# Patient Record
Sex: Male | Born: 1935 | Race: White | Hispanic: No | Marital: Single | Smoking: Former smoker
Health system: Southern US, Community
[De-identification: ages and names within clinical notes are randomized; demographics above are authoritative.]

## PROBLEM LIST (undated history)

## (undated) DIAGNOSIS — J189 Pneumonia, unspecified organism: Secondary | ICD-10-CM

## (undated) DIAGNOSIS — I359 Nonrheumatic aortic valve disorder, unspecified: Secondary | ICD-10-CM

## (undated) DIAGNOSIS — J8410 Pulmonary fibrosis, unspecified: Secondary | ICD-10-CM

## (undated) DIAGNOSIS — I10 Essential (primary) hypertension: Secondary | ICD-10-CM

## (undated) DIAGNOSIS — I714 Abdominal aortic aneurysm, without rupture, unspecified: Secondary | ICD-10-CM

## (undated) DIAGNOSIS — N189 Chronic kidney disease, unspecified: Secondary | ICD-10-CM

## (undated) DIAGNOSIS — J9611 Chronic respiratory failure with hypoxia: Secondary | ICD-10-CM

## (undated) DIAGNOSIS — F411 Generalized anxiety disorder: Secondary | ICD-10-CM

## (undated) DIAGNOSIS — I4891 Unspecified atrial fibrillation: Principal | ICD-10-CM

## (undated) DIAGNOSIS — I739 Peripheral vascular disease, unspecified: Secondary | ICD-10-CM

## (undated) DIAGNOSIS — E785 Hyperlipidemia, unspecified: Secondary | ICD-10-CM

## (undated) DIAGNOSIS — E538 Deficiency of other specified B group vitamins: Secondary | ICD-10-CM

## (undated) DIAGNOSIS — G609 Hereditary and idiopathic neuropathy, unspecified: Secondary | ICD-10-CM

## (undated) DIAGNOSIS — D649 Anemia, unspecified: Secondary | ICD-10-CM

## (undated) DIAGNOSIS — R3129 Other microscopic hematuria: Secondary | ICD-10-CM

## (undated) DIAGNOSIS — I2510 Atherosclerotic heart disease of native coronary artery without angina pectoris: Secondary | ICD-10-CM

## (undated) DIAGNOSIS — E119 Type 2 diabetes mellitus without complications: Secondary | ICD-10-CM

## (undated) DIAGNOSIS — I6529 Occlusion and stenosis of unspecified carotid artery: Secondary | ICD-10-CM

## (undated) DIAGNOSIS — E039 Hypothyroidism, unspecified: Secondary | ICD-10-CM

## (undated) DIAGNOSIS — J449 Chronic obstructive pulmonary disease, unspecified: Secondary | ICD-10-CM

## (undated) DIAGNOSIS — D539 Nutritional anemia, unspecified: Secondary | ICD-10-CM

## (undated) DIAGNOSIS — G629 Polyneuropathy, unspecified: Secondary | ICD-10-CM

## (undated) HISTORY — DX: Nonrheumatic aortic valve disorder, unspecified: I35.9

## (undated) HISTORY — DX: Pulmonary fibrosis, unspecified: J84.10

## (undated) HISTORY — DX: Chronic kidney disease, unspecified: N18.9

## (undated) HISTORY — DX: Abdominal aortic aneurysm, without rupture: I71.4

## (undated) HISTORY — DX: Anemia, unspecified: D64.9

## (undated) HISTORY — DX: Type 2 diabetes mellitus without complications: E11.9

## (undated) HISTORY — DX: Peripheral vascular disease, unspecified: I73.9

## (undated) HISTORY — DX: Hereditary and idiopathic neuropathy, unspecified: G60.9

## (undated) HISTORY — DX: Pneumonia, unspecified organism: J18.9

## (undated) HISTORY — DX: Chronic respiratory failure with hypoxia: J96.11

## (undated) HISTORY — DX: Abdominal aortic aneurysm, without rupture, unspecified: I71.40

## (undated) HISTORY — DX: Essential (primary) hypertension: I10

## (undated) HISTORY — DX: Generalized anxiety disorder: F41.1

## (undated) HISTORY — DX: Polyneuropathy, unspecified: G62.9

## (undated) HISTORY — PX: OTHER SURGICAL HISTORY: SHX169

## (undated) HISTORY — DX: Deficiency of other specified B group vitamins: E53.8

## (undated) HISTORY — DX: Other microscopic hematuria: R31.29

## (undated) HISTORY — DX: Hyperlipidemia, unspecified: E78.5

## (undated) HISTORY — DX: Chronic obstructive pulmonary disease, unspecified: J44.9

## (undated) HISTORY — DX: Nutritional anemia, unspecified: D53.9

## (undated) HISTORY — DX: Occlusion and stenosis of unspecified carotid artery: I65.29

## (undated) HISTORY — DX: Atherosclerotic heart disease of native coronary artery without angina pectoris: I25.10

---

## 1987-09-01 HISTORY — PX: HERNIA REPAIR: SHX51

## 1992-08-31 HISTORY — PX: INGUINAL HERNIA REPAIR: SUR1180

## 2008-12-10 ENCOUNTER — Ambulatory Visit: Payer: Self-pay | Admitting: Vascular Surgery

## 2008-12-13 ENCOUNTER — Encounter: Admission: RE | Admit: 2008-12-13 | Discharge: 2008-12-13 | Payer: Self-pay | Admitting: Cardiology

## 2008-12-14 ENCOUNTER — Inpatient Hospital Stay (HOSPITAL_COMMUNITY): Admission: RE | Admit: 2008-12-14 | Discharge: 2008-12-15 | Payer: Self-pay | Admitting: Cardiology

## 2009-11-26 ENCOUNTER — Encounter: Admission: RE | Admit: 2009-11-26 | Discharge: 2009-11-26 | Payer: Self-pay | Admitting: Cardiology

## 2009-11-26 ENCOUNTER — Encounter: Payer: Self-pay | Admitting: Pulmonary Disease

## 2009-12-02 ENCOUNTER — Encounter: Payer: Self-pay | Admitting: Pulmonary Disease

## 2009-12-04 DIAGNOSIS — J439 Emphysema, unspecified: Secondary | ICD-10-CM

## 2009-12-05 ENCOUNTER — Ambulatory Visit: Payer: Self-pay | Admitting: Pulmonary Disease

## 2009-12-05 DIAGNOSIS — J9611 Chronic respiratory failure with hypoxia: Secondary | ICD-10-CM | POA: Insufficient documentation

## 2009-12-05 DIAGNOSIS — J8410 Pulmonary fibrosis, unspecified: Secondary | ICD-10-CM

## 2009-12-05 DIAGNOSIS — F17200 Nicotine dependence, unspecified, uncomplicated: Secondary | ICD-10-CM

## 2009-12-06 ENCOUNTER — Encounter: Payer: Self-pay | Admitting: Pulmonary Disease

## 2009-12-08 ENCOUNTER — Encounter: Payer: Self-pay | Admitting: Pulmonary Disease

## 2009-12-15 ENCOUNTER — Encounter: Payer: Self-pay | Admitting: Pulmonary Disease

## 2009-12-17 LAB — CONVERTED CEMR LAB
ANA Titer 1: 1:640 {titer} — ABNORMAL HIGH
Anti Nuclear Antibody(ANA): POSITIVE — AB
Bilirubin, Direct: 0.2 mg/dL (ref 0.0–0.3)
Rhuematoid fact SerPl-aCnc: <20 intl units/mL (ref 0.0–20.0)
Sed Rate: 56 mm/hr — ABNORMAL HIGH (ref 0–22)
Total Bilirubin: 0.6 mg/dL (ref 0.3–1.2)

## 2009-12-24 ENCOUNTER — Encounter: Payer: Self-pay | Admitting: Pulmonary Disease

## 2010-01-02 ENCOUNTER — Ambulatory Visit: Payer: Self-pay | Admitting: Pulmonary Disease

## 2010-01-06 ENCOUNTER — Telehealth: Payer: Self-pay | Admitting: Pulmonary Disease

## 2010-01-10 ENCOUNTER — Telehealth (INDEPENDENT_AMBULATORY_CARE_PROVIDER_SITE_OTHER): Payer: Self-pay | Admitting: *Deleted

## 2010-01-12 ENCOUNTER — Encounter: Payer: Self-pay | Admitting: Pulmonary Disease

## 2010-01-13 ENCOUNTER — Encounter: Payer: Self-pay | Admitting: Pulmonary Disease

## 2010-01-14 ENCOUNTER — Encounter: Payer: Self-pay | Admitting: Pulmonary Disease

## 2010-01-15 ENCOUNTER — Telehealth (INDEPENDENT_AMBULATORY_CARE_PROVIDER_SITE_OTHER): Payer: Self-pay | Admitting: *Deleted

## 2010-01-15 ENCOUNTER — Telehealth: Payer: Self-pay | Admitting: Pulmonary Disease

## 2010-01-16 ENCOUNTER — Encounter: Payer: Self-pay | Admitting: Pulmonary Disease

## 2010-01-18 ENCOUNTER — Encounter: Payer: Self-pay | Admitting: Pulmonary Disease

## 2010-01-31 ENCOUNTER — Ambulatory Visit: Payer: Self-pay | Admitting: Pulmonary Disease

## 2010-02-03 ENCOUNTER — Telehealth (INDEPENDENT_AMBULATORY_CARE_PROVIDER_SITE_OTHER): Payer: Self-pay | Admitting: *Deleted

## 2010-02-04 ENCOUNTER — Encounter: Payer: Self-pay | Admitting: Pulmonary Disease

## 2010-02-17 ENCOUNTER — Encounter: Payer: Self-pay | Admitting: Pulmonary Disease

## 2010-02-20 ENCOUNTER — Encounter: Payer: Self-pay | Admitting: Pulmonary Disease

## 2010-02-26 ENCOUNTER — Ambulatory Visit: Payer: Self-pay | Admitting: Pulmonary Disease

## 2010-03-12 ENCOUNTER — Encounter: Payer: Self-pay | Admitting: Pulmonary Disease

## 2010-03-20 ENCOUNTER — Encounter: Payer: Self-pay | Admitting: Pulmonary Disease

## 2010-04-07 ENCOUNTER — Telehealth (INDEPENDENT_AMBULATORY_CARE_PROVIDER_SITE_OTHER): Payer: Self-pay | Admitting: *Deleted

## 2010-04-30 ENCOUNTER — Ambulatory Visit: Payer: Self-pay | Admitting: Pulmonary Disease

## 2010-05-23 ENCOUNTER — Encounter: Payer: Self-pay | Admitting: Pulmonary Disease

## 2010-08-01 ENCOUNTER — Ambulatory Visit: Payer: Self-pay | Admitting: Pulmonary Disease

## 2010-08-01 DIAGNOSIS — J310 Chronic rhinitis: Secondary | ICD-10-CM

## 2010-09-30 NOTE — Assessment & Plan Note (Signed)
Summary: ROV/ MBW   Copy to:  Dr. [name]  CC:  1 month follow up.  Per pt's daughter, pt was in ICU at [facility] approx 2 wks ago and was diagnosed with CHF.  Pt states he is still having SOB with exertion but states this is better since last ov.  Denies wheezing, chest tightness, and cough.  Pt states he needs diagnosis code for nebulizar rx that was given while he was in the hospital..  History of Present Illness: 77 yo male with COPD, Pulmonary fibrosis, and hypoxemia.  He was hospitalized recently at [facility] for CHF exacerbation.  He was told he had a mild heart attack also.  He was in the ICU there, and had to use BPAP temporarily.  He was given lasix, and his breathing improved.  He was prescribed a nebulizer, but has not gotten this filled yet.  He is not having much cough, wheeze, or congestion.  He still occasionally brings up some sputum with some blood streaking.  He has not had fever, or skin rash.  He is using prednisone 10 mg once daily.  He is using his symbicort once per day, and stopped spiriva about one week ago.  He is concerned about the expense of his medications.  He has not been using his oxygen on a consistent basis.    He needs to schedule a follow up visit with Dr. Donnie Aho.  He is planning on scheduling an appointment with Dr. Murray Hodgkins to establish a primary care physician in [place].   Current Medications (verified): 1)  Spiriva Handihaler 18 Mcg Caps (Tiotropium Bromide Monohydrate) .Marland Kitchen.. 1 Capsule Daily in Handihaler 2)  Proventil Hfa 108 (90 Base) Mcg/act Aers (Albuterol Sulfate) .... As Needed 3)  Aspir-Low 81 Mg Tbec (Aspirin) .... Once Daily 4)  Plavix 75 Mg Tabs (Clopidogrel Bisulfate) .Marland Kitchen.. 1 By Mouth Daily 5)  Pravastatin Sodium 20 Mg Tabs (Pravastatin Sodium) .Marland Kitchen.. 1 By Mouth At Bedtime 6)  Niaspan 500 Mg Cr-Tabs (Niacin (Antihyperlipidemic)) .Marland Kitchen.. 1 By Mouth At Bedtime 7)  Fish Oil 1000 Mg Caps (Omega-3 Fatty Acids) .Marland Kitchen.. 1 By Mouth  Daily 8)  Gabapentin 300 Mg Caps (Gabapentin) .... 2 By Mouth Daily 9)  Nitrostat 0.4 Mg Subl (Nitroglycerin) .... As Needed Subl 10)  Sertraline Hcl 50 Mg Tabs (Sertraline Hcl) .Marland Kitchen.. 1 By Mouth Daily 11)  Symbicort 160-4.5 Mcg/act Aero (Budesonide-Formoterol Fumarate) .... Two Puffs Once Daily 12)  Mucinex 600 Mg Xr12h-Tab (Guaifenesin) .Marland Kitchen.. 1 By Mouth Two Times A Day 13)  Prednisone 10 Mg Tabs (Prednisone) .... 2 Pills Once Daily 14)  Coreg 3.125 Mg Tabs (Carvedilol) .... Take 1 Tablet By Mouth Two Times A Day 15)  Xanax 0.25 Mg Tabs (Alprazolam) .... Take 1 Tab By Mouth At Bedtime As Needed 16)  Xopenex 1.25 Mg/16ml Nebu (Levalbuterol Hcl) .... Every 6 Hours As Needed 17)  Januvia 50 Mg Tabs (Sitagliptin Phosphate) .... Once Daily 18)  Imdur 30 Mg Xr24h-Tab (Isosorbide Mononitrate) .... Two Times A Day 19)  Oxycodone Hcl 5 Mg Tabs (Oxycodone Hcl) .... Every 6 Hours As Needed Pain 20)  Mag-Ox 400 400 Mg Tabs (Magnesium Oxide) .... Once Daily 21)  Flexeril 5 Mg Tabs (Cyclobenzaprine Hcl) .... As Needed  Allergies (verified): No Known Drug Allergies  Past History:  Past Medical History: Reviewed history from 01/02/2010 and no changes required. GOLD 4 COPD      - Spirometry 12/05/09 FEV1 1.20(47%), FVC 2.56(64%), FEV1% 46 Hypoxemia      -  SpO2 86% room air with exertion 12/05/09      - 3 liters oxygen with exertion and sleep Pulmonary fibrosis      - ANA 12/05/09 titer 1:640 CAD s/p stenting Hypertension Hyperlipidemia Carotid stenosis Peripheral vascular disease Anemia Neuropathy B12 deficiency Pneumonia  Past Surgical History: Reviewed history from 12/05/2009 and no changes required. Inguinal hernia repair in 1994  Vital Signs:  Patient profile:   77 year old male Height:      70 inches Weight:      148 pounds BMI:     21.31 O2 Sat:      93 % on Room air Temp:     97.9 degrees F oral Pulse rate:   72 / minute BP sitting:   112 / 56  (right arm) Cuff size:    regular  Vitals Entered By: Gweneth Dimitri RN (January 31, 2010 12:43 PM)  O2 Flow:  Room air CC: 1 month follow up.  Per pt's daughter, pt was in ICU at [facility] approx 2 wks ago and was diagnosed with CHF.  Pt states he is still having SOB with exertion but states this is better since last ov.  Denies wheezing, chest tightness, cough.  Pt states he needs diagnosis code for nebulizar rx that was given while he was in the hospital. Comments Medications reviewed with patient Daytime contact number verified with patient. Gweneth Dimitri RN  January 31, 2010 12:43 PM    Physical Exam  General:  thin.   Nose:  pale mucosa, no discharge Mouth:  wears dentures, no exudate Neck:  no JVD.   Lungs:  diminished breath sounds, prolonged exhalation, no wheezing, no rales Heart:  regular rhythm and normal rate with 2/6 SM Extremities:  no clubbing, cyanosis, edema, or deformity noted Cervical Nodes:  no significant adenopathy   Impression & Recommendations:  Problem # 1:  PULMONARY FIBROSIS (ICD-515) He has elevated ANA.  Will continue to gradually taper his prednisone.  Will get copy of his chest xray from [facility].  Will plan on repeating his chest xray at his next follow up.  Problem # 2:  C O P D (ICD-496) He is concerned about how much his inhaler regimen has been costing him.  As a result he has not been using his inhalers on a regular basis.  Will stop his spiriva and symbicort.  Will have him use albuterol and ipratropium in nebulizer qid as needed, and proventil as needed.  Depending on his symptoms will decide if he needs to restart his other inhalers.  Problem # 3:  TOBACCO ABUSE (ICD-305.1) Encouraged him to remain tobacco free.  Problem # 4:  HYPOXEMIA (ICD-799.02) Explained to him that he definitely needs to use his oxygen with exertion and sleep for now.  Will re-assess at next visit.  Problem # 5:  CAD (ICD-414.00) He was recently hospitalized in [facility] for heart  failure and possible heart attack.  Will get copy of his medical records.  Advised him to follow up with Dr. Donnie Aho, and d/w Dr. Donnie Aho about whether he can have an adjustment in his cardiac medication regimen.  He is concerned about the expense of his medications.  Medications Added to Medication List This Visit: 1)  Prednisone 10 Mg Tabs (Prednisone) .... 2 pills once daily 2)  Ipratropium Bromide 0.06 % Soln (Ipratropium bromide) .... One vial nebulized up to four times per day as needed 3)  Albuterol Sulfate (2.5 Mg/10ml) 0.083% Nebu (Albuterol  sulfate) .... One vial nebulized up to four times per day as needed 4)  Xopenex 1.25 Mg/40ml Nebu (Levalbuterol hcl) .... Every 6 hours as needed 5)  Prednisone 10 Mg Tabs (Prednisone) .... One pill alternating with 1/2 pill every other day for two weeks, then 1/2 pill once daily 6)  Aspir-low 81 Mg Tbec (Aspirin) .... Once daily 7)  Coreg 3.125 Mg Tabs (Carvedilol) .... Take 1 tablet by mouth two times a day 8)  Imdur 30 Mg Xr24h-tab (Isosorbide mononitrate) .... Two times a day 9)  Xanax 0.25 Mg Tabs (Alprazolam) .... Take 1 tab by mouth at bedtime as needed 10)  Januvia 50 Mg Tabs (Sitagliptin phosphate) .... Once daily 11)  Oxycodone Hcl 5 Mg Tabs (Oxycodone hcl) .... Every 6 hours as needed pain 12)  Mag-ox 400 400 Mg Tabs (Magnesium oxide) .... Once daily 13)  Flexeril 5 Mg Tabs (Cyclobenzaprine hcl) .... As needed  Complete Medication List: 1)  Ipratropium Bromide 0.06 % Soln (Ipratropium bromide) .... One vial nebulized up to four times per day as needed 2)  Albuterol Sulfate (2.5 Mg/70ml) 0.083% Nebu (Albuterol sulfate) .... One vial nebulized up to four times per day as needed 3)  Proventil Hfa 108 (90 Base) Mcg/act Aers (Albuterol sulfate) .... As needed 4)  Prednisone 10 Mg Tabs (Prednisone) .... One pill alternating with 1/2 pill every other day for two weeks, then 1/2 pill once daily 5)  Aspir-low 81 Mg Tbec (Aspirin) .... Once daily 6)   Plavix 75 Mg Tabs (Clopidogrel bisulfate) .Marland Kitchen.. 1 by mouth daily 7)  Coreg 3.125 Mg Tabs (Carvedilol) .... Take 1 tablet by mouth two times a day 8)  Imdur 30 Mg Xr24h-tab (Isosorbide mononitrate) .... Two times a day 9)  Pravastatin Sodium 20 Mg Tabs (Pravastatin sodium) .Marland Kitchen.. 1 by mouth at bedtime 10)  Niaspan 500 Mg Cr-tabs (Niacin (antihyperlipidemic)) .Marland Kitchen.. 1 by mouth at bedtime 11)  Fish Oil 1000 Mg Caps (Omega-3 fatty acids) .Marland Kitchen.. 1 by mouth daily 12)  Gabapentin 300 Mg Caps (Gabapentin) .... 2 by mouth daily 13)  Nitrostat 0.4 Mg Subl (Nitroglycerin) .... As needed subl 14)  Sertraline Hcl 50 Mg Tabs (Sertraline hcl) .Marland Kitchen.. 1 by mouth daily 15)  Mucinex 600 Mg Xr12h-tab (Guaifenesin) .Marland Kitchen.. 1 by mouth two times a day 16)  Xanax 0.25 Mg Tabs (Alprazolam) .... Take 1 tab by mouth at bedtime as needed 17)  Januvia 50 Mg Tabs (Sitagliptin phosphate) .... Once daily 18)  Oxycodone Hcl 5 Mg Tabs (Oxycodone hcl) .... Every 6 hours as needed pain 19)  Mag-ox 400 400 Mg Tabs (Magnesium oxide) .... Once daily 20)  Flexeril 5 Mg Tabs (Cyclobenzaprine hcl) .... As needed  Other Orders: DME Referral (DME) Est. Patient Level III (91478) Prescription Created Electronically [phone])  Patient Instructions: 1)  Stop spiriva 2)  Stop symbicort 3)  Albuterol with Ipratropium nebulized up to four times per day as needed  4)  Proventil two puffs up to four times per day as needed 5)  Prednisone 10 mg pills: 1 pill alternating with 1/2 pill every other day for two weeks, then 1/2 pill once daily until next visit 6)  Will get copy of medical records form [facility] 7)  Follow up in 4 weeks Prescriptions: IPRATROPIUM BROMIDE 0.06 % SOLN (IPRATROPIUM BROMIDE) one vial nebulized up to four times per day as needed  #120 x 6   Entered and Authorized by:   Coralyn Helling MD   Signed by:  Coralyn Helling MD on 01/31/2010   Method used:   Electronically to        [facility] [phone]* (retail)       [address]       [place], [place]  [address]       Ph: [phone] or [phone]       Fax: [phone]   RxID:   [id number] ALBUTEROL SULFATE (2.5 MG/3ML) 0.083% NEBU (ALBUTEROL SULFATE) one vial nebulized up to four times per day as needed  #120 x 6   Entered and Authorized by:   Coralyn Helling MD   Signed by:   Coralyn Helling MD on 01/31/2010   Method used:   Electronically to        [facility] [phone]* (retail)       [address]       [place], [place]  [address]       Ph: [phone] or [phone]       Fax: [phone]   RxID:   [id number]

## 2010-09-30 NOTE — Assessment & Plan Note (Signed)
Summary: pulm fibrosis & severe dyspnea/apc   Copy to:  Dr. [name]  CC:  Pulmonary consult.  The patient c/o increased sob that is worse x1 month..  History of Present Illness: 77 yo male for evaluation of COPD, dyspnea, and abnormal xray.  He has been having trouble with his breathing for some time.  He has an extensive history of smoking, and has been told he has COPD.  He has been on inhaler therapy for about 1.5 years.  His breathing has been getting worse recently.  He was seen by his cardiologist to determine if his heart dysfunction was contributing to his symptoms.  During this evaluation he had a chest xray which show emphysema and basilar fibrosis.  The fibrosis was a new finding.  He then had CT chest which confirmed basilar fibrosis.  As a result pulmonary evaluation was requested.  He had pneumonia about one year ago, and had a mild heart attack associated with this.  He was intubated in January 2010 while in [place] due to heart and breathing problems.   He has been getting wheeze and chest congestion.  He brings up green to yellow sputum.  He denies fever or hemoptysis.  He has been getting sinus congestion with post-nasal drip.  He has not had any skin rash, joint swelling, Raynaud's like symptoms.  He has lost about 15 lbs recently.  He denies chest pain, but occasionally feels his heart race.  He denies abdominal pain, or diarrhea.    There is no history of asthma or TB.  He started smoking at age 29, and smoked up to 1 pack per day.  He quit smoking in March 2011.  He worked in a [employer].  There is no recent travel history, or sick exposures.  He denies animal exposures.  His recent lab tests showed he has anemia and B12 deficiency.  CXR  Procedure date:  12/13/2008  Findings:       CHEST - 2 VIEW    Comparison: None    Findings: The lungs are hyperaerated consistent with COPD.  No   active infiltrate or effusion is seen.  Mild peribronchial  thickening is noted. The heart is within normal limits in size.   There are degenerative changes throughout the thoracic spine.    IMPRESSION:   COPD.  Peribronchial thickening may indicate bronchitis.  No   infiltrate or effusion is seen.  CXR  Procedure date:  11/26/2009  Findings:      CHEST - 2 VIEW    Comparison: 12/13/2008    Findings: There is hyperinflation of the lungs compatible with   COPD.  Increasing interstitial prominence throughout the lungs,   particularly lung bases.  I suspect this may reflect scarring /   early fibrosis.  No definite acute opacity or effusion.  Heart is   borderline in size.  No acute bony abnormality.    IMPRESSION:   COPD/chronic changes.  Increasing interstitial prominence in the   lower lobes may reflect early fibrosis.  CT of Chest  Procedure date:  11/26/2009  Findings:      Contrast:  100 ml Omnipaque 300 IV.    Comparison:  Chest x-ray 11/26/2009    Findings:  No filling defects in the pulmonary arteries to suggest   pulmonary emboli.    Severe COPD changes throughout the lungs.  Reticular densities are   seen in the lower lobes bilaterally, predominately peripheral.   Question early fibrosis/UIP.  Ground-glass densities  are noted   posteriorly in both lung bases, possibly atelectasis.    Scattered borderline mediastinal lymph nodes.  No hilar or axillary   adenopathy.  Heart is borderline in size.  Aorta is normal caliber.   Coronary artery calcifications are present.    Imaging into the upper abdomen shows no acute findings.  Numerous   bilateral renal cysts partially imaged.    Review of the MIP images confirms the above findings.    IMPRESSION:   No evidence of pulmonary embolus.    Severe COPD.    Interstitial and reticular opacities in the lung bases.  Cannot   exclude early IPF/UIP.   Preventive Screening-Counseling & Management  Alcohol-Tobacco     Smoking Status: quit < 6 months     Year Quit:  10/2009     Pack years: 65 yrs x1/2 to 1 ppd  CardioPerfect Spirometry  ID: [id number] Patient: Travis Bond, Travis Bond DOB: 08-21-1927 Age: 77 Years Old Sex: Male Race: White Height: 70.5 Weight: 142.38 Status: Unconfirmed Past Medical History:  Current Problems:  CAD (ICD-414.00) PERIPHERAL NEUROPATHY, IDIOPATHIC (ICD-356.9) CAROTID ARTERY STENOSIS (ICD-433.10) ANEMIA (ICD-285.9) PERIPHERAL VASCULAR DISEASE (ICD-443.9) HYPERLIPIDEMIA (ICD-272.4) C O P D (ICD-496)  Recorded: 12/05/2009 10:47 AM  Parameter  Measured Predicted %Predicted FVC     2.56        3.98        64.40 FEV1     1.20        2.83        42.40 FEV1%   46.74        71.54        65.30 PEF    2.77        7.16        38.60   Interpretation: Severe obstruction  Current Medications (verified): 1)  Spiriva Handihaler 18 Mcg Caps (Tiotropium Bromide Monohydrate) .Marland Kitchen.. 1 Capsule Daily in Handihaler 2)  Proventil Hfa 108 (90 Base) Mcg/act Aers (Albuterol Sulfate) .... As Needed 3)  Metoprolol Tartrate 25 Mg Tabs (Metoprolol Tartrate) .... 1/2 By Mouth Two Times A Day 4)  Amlodipine Besylate 5 Mg Tabs (Amlodipine Besylate) .Marland Kitchen.. 1 By Mouth Daily 5)  Isosorbide Mononitrate Cr 60 Mg Xr24h-Tab (Isosorbide Mononitrate) .Marland Kitchen.. 1 By Mouth Daily 6)  Aspirin 325 Mg Tabs (Aspirin) .Marland Kitchen.. 1 By Mouth Daily 7)  Plavix 75 Mg Tabs (Clopidogrel Bisulfate) .Marland Kitchen.. 1 By Mouth Daily 8)  Pravastatin Sodium 20 Mg Tabs (Pravastatin Sodium) .Marland Kitchen.. 1 By Mouth At Bedtime 9)  Niaspan 500 Mg Cr-Tabs (Niacin (Antihyperlipidemic)) .Marland Kitchen.. 1 By Mouth At Bedtime 10)  Fish Oil 1000 Mg Caps (Omega-3 Fatty Acids) .Marland Kitchen.. 1 By Mouth Daily 11)  Gabapentin 300 Mg Caps (Gabapentin) .... 2 By Mouth Daily 12)  Nitrostat 0.4 Mg Subl (Nitroglycerin) .... As Needed Subl 13)  Sertraline Hcl 50 Mg Tabs (Sertraline Hcl) .Marland Kitchen.. 1 By Mouth Daily 14)  Guaifenesin 400 Mg Tabs (Guaifenesin) .... Two Times A Day  Allergies (verified): No Known Drug Allergies  Past History:  Past  Medical History: GOLD 4 COPD      - Spirometry 12/05/09 FEV1 1.20(47%), FVC 2.56(64%), FEV1% 46 Hypoxemia      - SpO2 86% room air with exertion 12/05/09 CAD s/p stenting Hypertension Hyperlipidemia Carotid stenosis Peripheral vascular disease Anemia Neuropathy B12 deficiency Pneumonia  Past Surgical History: Inguinal hernia repair in 1994  Family History: Family History Hypertension---brother and sister  Social History: Divorced.  Lives with daughter, Oren Binet. Retired. Quit smoking March 2011. Smoking Status:  quit < 6 months Pack years:  65 yrs x1/2 to 1 ppd  Review of Systems       The patient complains of shortness of breath with activity, productive cough, chest pain, loss of appetite, weight change, nasal congestion/difficulty breathing through nose, itching, anxiety, depression, and change in color of mucus.  The patient denies shortness of breath at rest, non-productive cough, coughing up blood, irregular heartbeats, acid heartburn, indigestion, abdominal pain, difficulty swallowing, sore throat, tooth/dental problems, headaches, sneezing, ear ache, hand/feet swelling, joint stiffness or pain, rash, and fever.    Vital Signs:  Patient profile:   77 year old male Height:      70.5 inches (179.07 cm) Weight:      142.38 pounds (64.72 kg) BMI:     20.21 O2 Sat:      84 % on Room air Temp:     97.6 degrees F (36.44 degrees C) oral Pulse rate:   66 / minute BP sitting:   112 / 70  (left arm) Cuff size:   regular  Vitals Entered By: Michel Bickers CMA (December 05, 2009 10:11 AM)  O2 Sat at Rest %:  84 O2 Flow:  Room air  O2 Sat Comments The patients sats were at 84% on room air after walking to the exam room. He was placed on oxygen at 2 liters and his sats recovered to 91% and pulse was 66. Michel Bickers CMA  December 05, 2009 10:13 AM  Serial Vital Signs/Assessments:  Comments: 11:08 AM Ambulatory Pulse Oximetry  Resting; HR__67___    02 Sat__90___  Lap1  (185 feet)   HR__94___   02 Sat__95___ Lap2 (185 feet)   HR_84____   02 Sat_86____    Lap3 (185 feet)   HR_____   02 Sat_____  ___Test Completed without Difficulty __x_Test Stopped due to:  Patients sats dropped to 86% on room air after walking 2 laps. He was placed on oxygen on 2 liters and sats recovered to 96% and his pulse was 60.  By: Michel Bickers CMA   CC: Pulmonary consult.  The patient c/o increased sob that is worse x1 month.   Physical Exam  General:  thin.   Eyes:  PERRLA and EOMI.   Nose:  pale mucosa, no discharge Mouth:  wears dentures, no exudate Neck:  no JVD.   Chest Wall:  barrel chest.   Lungs:  diminished breath sounds, prolonged exhalation, no wheezing, faint basilar rales Heart:  regular rhythm and normal rate with 3/6 SM Abdomen:  thin, soft, nontender, no organomegaly Msk:  no deformity or scoliosis noted with normal posture Pulses:  pulses normal Extremities:  no clubbing, cyanosis, edema, or deformity noted Neurologic:  CN II-XII grossly intact with normal reflexes, coordination, muscle strength and tone Cervical Nodes:  no significant adenopathy Psych:  depressed affect.     Pulmonary Function Test Date: 12/05/2009 10:47 AM Gender: Male  Pre-Spirometry FVC    Value: 2.56 L/min   % Pred: 64.40 % FEV1    Value: 1.20 L     Pred: 2.83 L     % Pred: 42.40 % FEV1/FVC  Value: 46.74 %     % Pred: 65.30 %  Comments: Severe Obstruction.  Impression & Recommendations:  Problem # 1:  C O P D (ICD-496) His predominant respiratory disorder is related to his COPD with emphysema.  I have encouraged him to continue with his smoking abstinence.  He is to continue spiriva.  I will augment  his inhaler regimen by adding symbicort.  He is to continue on as needed proventil.  If he is to relocate to [facility] permanently, then consideration will be given to referring him to pulmonary rehab.  He also has a mild exacerbation with increased sputum and sputum  purulence.  Will give him a course of doxycycline, and prednisone.  Problem # 2:  PULMONARY FIBROSIS (ICD-515) He has mild fibrotic changes on CT chest.  Certainly he could have early IPF/UIP.  He could also have NSIP related to smoking.  I do not think he would be able to tolerate any type of lung tissue sampling.  I will check his labs to exclude any type of connective tissue disorder that may be causing this, but did not appreciate clinical evidence for these.  Will also give him a short trial of prednisone to see if he has symptomatic improvement as he may have some active inflammation.  Problem # 3:  HYPOXEMIA (ICD-799.02)  He does have oxygen desaturation with exertion.  Will start him on 2 liters oxygen with exertion and sleep.  Problem # 4:  TOBACCO ABUSE (ICD-305.1)  He has extensive prior history of smoking.  He has recently stopped smoking.  I have encouraged him to continue with his smoking abstinence.  Problem # 5:  ANEMIA (ICD-285.9) Certainly his anemia could be contributing to some degree to his current symptoms of dypsnea.  Hopefully as this is correct his breathing will improve to some degree.  Medications Added to Medication List This Visit: 1)  Symbicort 160-4.5 Mcg/act Aero (Budesonide-formoterol fumarate) .... Two puffs two times a day 2)  Prednisone 10 Mg Tabs (Prednisone) .... 3 pills for 2 days, 2 pills for 2 days, 1 pill for 2 days, 1/2 pill for 2 days 3)  Doxycycline Hyclate 100 Mg Caps (Doxycycline hyclate) .... One two times a day  Complete Medication List: 1)  Spiriva Handihaler 18 Mcg Caps (Tiotropium bromide monohydrate) .Marland Kitchen.. 1 capsule daily in handihaler 2)  Proventil Hfa 108 (90 Base) Mcg/act Aers (Albuterol sulfate) .... As needed 3)  Metoprolol Tartrate 25 Mg Tabs (Metoprolol tartrate) .... 1/2 by mouth two times a day 4)  Amlodipine Besylate 5 Mg Tabs (Amlodipine besylate) .Marland Kitchen.. 1 by mouth daily 5)  Isosorbide Mononitrate Cr 60 Mg Xr24h-tab (Isosorbide  mononitrate) .Marland Kitchen.. 1 by mouth daily 6)  Aspirin 325 Mg Tabs (Aspirin) .Marland Kitchen.. 1 by mouth daily 7)  Plavix 75 Mg Tabs (Clopidogrel bisulfate) .Marland Kitchen.. 1 by mouth daily 8)  Pravastatin Sodium 20 Mg Tabs (Pravastatin sodium) .Marland Kitchen.. 1 by mouth at bedtime 9)  Niaspan 500 Mg Cr-tabs (Niacin (antihyperlipidemic)) .Marland Kitchen.. 1 by mouth at bedtime 10)  Fish Oil 1000 Mg Caps (Omega-3 fatty acids) .Marland Kitchen.. 1 by mouth daily 11)  Gabapentin 300 Mg Caps (Gabapentin) .... 2 by mouth daily 12)  Nitrostat 0.4 Mg Subl (Nitroglycerin) .... As needed subl 13)  Sertraline Hcl 50 Mg Tabs (Sertraline hcl) .Marland Kitchen.. 1 by mouth daily 14)  Guaifenesin 400 Mg Tabs (Guaifenesin) .... Two times a day 15)  Symbicort 160-4.5 Mcg/act Aero (Budesonide-formoterol fumarate) .... Two puffs two times a day 16)  Prednisone 10 Mg Tabs (Prednisone) .... 3 pills for 2 days, 2 pills for 2 days, 1 pill for 2 days, 1/2 pill for 2 days 17)  Doxycycline Hyclate 100 Mg Caps (Doxycycline hyclate) .... One two times a day  Other Orders: Consultation Level IV (25956) Spirometry w/Graph (94010) TLB-Hepatic/Liver Function Pnl (80076-HEPATIC) TLB-Rheumatoid Factor (RA) (38756-EP) TLB-Sedimentation Rate (ESR) (85652-ESR) T-Antinuclear  Antib (ANA) [id number]) DME Referral (DME)  Patient Instructions: 1)  Doxycycline (antibiotic) one pill two times a day for 7 days 2)  Prednisone 10 mg pills: 3 for 2 days, 2 for 2 days, 1 for 2 days, 1/2 for 2 days 3)  Symbicort two puffs two times a day, and rinse mouth after using 4)  Continue Spiriva one puff once daily  5)  Continue Proventil two puffs up to four times per day for cough, wheeze, congestion, or shortness of breath 6)  Will start 2 liters oxygen with exertion and sleep.  Will arrange for oxygen test overnight 7)  Follow up in 4 weeks  Prescriptions: DOXYCYCLINE HYCLATE 100 MG CAPS (DOXYCYCLINE HYCLATE) one two times a day  #14 x 0   Entered and Authorized by:   Coralyn Helling MD   Signed by:   Coralyn Helling MD on  12/05/2009   Method used:   Print then Give to Patient   RxID:   [id number] PREDNISONE 10 MG TABS (PREDNISONE) 3 pills for 2 days, 2 pills for 2 days, 1 pill for 2 days, 1/2 pill for 2 days  #13 x 0   Entered and Authorized by:   Coralyn Helling MD   Signed by:   Coralyn Helling MD on 12/05/2009   Method used:   Print then Give to Patient   RxID:   [id number] SYMBICORT 160-4.5 MCG/ACT AERO (BUDESONIDE-FORMOTEROL FUMARATE) two puffs two times a day  #1 x 6   Entered and Authorized by:   Coralyn Helling MD   Signed by:   Coralyn Helling MD on 12/05/2009   Method used:   Print then Give to Patient   RxID:   [id number]

## 2010-09-30 NOTE — Letter (Signed)
Summary: [facility]  [facility]   Imported By: Lester Eclectic 02/10/2010 09:33:56  _____________________________________________________________________  External Attachment:    Type:   Image     Comment:   External Document

## 2010-09-30 NOTE — Assessment & Plan Note (Signed)
Summary: rov 4 wks ///kp   Visit Type:  Follow-up Copy to:  Dr. [name]  CC:  COPD. Pulmonary fibrosis.  The patient says his breathing has improved since starting Symbicort. He is not wearing his oxygen.Travis Bond  History of Present Illness: 77 yo male with COPD, Pulmonary fibrosis, and hypoxemia.  His breathing has been doing better.  He is not having as much cough.  He still has sputum which is clear.  He denies fever, or much wheeze.  He has been getting occasional nose bleeds.  He has not needed to use his proventil.  He is not using his oxygen.  He is not sure if he needs it.  His breathing has improved with prednisone also.  He is not having rash, swelling, or joint pain.   Current Medications (verified): 1)  Spiriva Handihaler 18 Mcg Caps (Tiotropium Bromide Monohydrate) .Travis Bond.. 1 Capsule Daily in Handihaler 2)  Proventil Hfa 108 (90 Base) Mcg/act Aers (Albuterol Sulfate) .... As Needed 3)  Metoprolol Tartrate 25 Mg Tabs (Metoprolol Tartrate) .... 1/2 By Mouth Two Times A Day 4)  Amlodipine Besylate 5 Mg Tabs (Amlodipine Besylate) .Travis Bond.. 1 By Mouth Daily 5)  Isosorbide Mononitrate Cr 60 Mg Xr24h-Tab (Isosorbide Mononitrate) .Travis Bond.. 1 By Mouth Daily 6)  Aspirin 325 Mg Tabs (Aspirin) .Travis Bond.. 1 By Mouth Daily 7)  Plavix 75 Mg Tabs (Clopidogrel Bisulfate) .Travis Bond.. 1 By Mouth Daily 8)  Pravastatin Sodium 20 Mg Tabs (Pravastatin Sodium) .Travis Bond.. 1 By Mouth At Bedtime 9)  Niaspan 500 Mg Cr-Tabs (Niacin (Antihyperlipidemic)) .Travis Bond.. 1 By Mouth At Bedtime 10)  Fish Oil 1000 Mg Caps (Omega-3 Fatty Acids) .Travis Bond.. 1 By Mouth Daily 11)  Gabapentin 300 Mg Caps (Gabapentin) .... 2 By Mouth Daily 12)  Nitrostat 0.4 Mg Subl (Nitroglycerin) .... As Needed Subl 13)  Sertraline Hcl 50 Mg Tabs (Sertraline Hcl) .Travis Bond.. 1 By Mouth Daily 14)  Guaifenesin 400 Mg Tabs (Guaifenesin) .... Two Times A Day 15)  Symbicort 160-4.5 Mcg/act Aero (Budesonide-Formoterol Fumarate) .... Two Puffs Two Times A Day 16)  Mucinex 600 Mg Xr12h-Tab  (Guaifenesin) .Travis Bond.. 1 By Mouth Two Times A Day  Allergies (verified): No Known Drug Allergies  Past History:  Past Medical History: GOLD 4 COPD      - Spirometry 12/05/09 FEV1 1.20(47%), FVC 2.56(64%), FEV1% 46 Hypoxemia      - SpO2 86% room air with exertion 12/05/09      - 3 liters oxygen with exertion and sleep Pulmonary fibrosis      - ANA 12/05/09 titer 1:640 CAD s/p stenting Hypertension Hyperlipidemia Carotid stenosis Peripheral vascular disease Anemia Neuropathy B12 deficiency Pneumonia  Vital Signs:  Patient profile:   77 year old male Height:      70.5 inches (179.07 cm) Weight:      141 pounds (64.09 kg) BMI:     20.02 O2 Sat:      93 % on Room air Temp:     97.8 degrees F (36.56 degrees C) oral Pulse rate:   60 / minute BP sitting:   116 / 74  (right arm) Cuff size:   regular  Vitals Entered By: Michel Bickers CMA (Jan 02, 2010 1:41 PM)  O2 Sat at Rest %:  93 O2 Flow:  Room air  Serial Vital Signs/Assessments:  Comments: 2:29 PM Ambulatory Pulse Oximetry  Resting; HR___58__    02 Sat__94% on room air__  Lap1 (185 feet)   HR__97___   02 Sat_92% on room air____ Lap2 (185 feet)  HR_124____   02 Sat__86% on room air___    Lap3 (185 feet)   HR_____   02 Sat_____  ___Test Completed without Difficulty _x__Test Stopped due to:  patients sats dropped to 86% on room air after 2 laps. the patient was placed on 2 liters of oxygen and his sats recovered to 92%. Pulse was 62.  By: Michel Bickers CMA    Physical Exam  General:  thin.   Nose:  pale mucosa, no discharge Mouth:  wears dentures, no exudate Neck:  no JVD.   Lungs:  diminished breath sounds, prolonged exhalation, no wheezing, faint basilar rales Heart:  regular rhythm and normal rate with 2/6 SM Extremities:  no clubbing, cyanosis, edema, or deformity noted Cervical Nodes:  no significant adenopathy   Impression & Recommendations:  Problem # 1:  PULMONARY FIBROSIS (ICD-515) He has an  elevated ANA.  I did not appreciate any other findings for connective tissue disease involvement outside his lungs.  He did have improvement after prednisone therapy.  I will repeat his chest xray today, and then decide if he needs more prolonged therapy with prednisone.  Problem # 2:  C O P D (ICD-496) His breathing has improved with inhaler therapy.  Will continue spiriva.  Will change his symbicort to two puffs once daily.  He is to continue proventil as needed.  Problem # 3:  HYPOXEMIA (ICD-799.02) He has continue oxygen desaturation with exertion.  He will continue with 3 liters oxygen with exertion and sleep.  Medications Added to Medication List This Visit: 1)  Symbicort 160-4.5 Mcg/act Aero (Budesonide-formoterol fumarate) .... Two puffs once daily 2)  Mucinex 600 Mg Xr12h-tab (Guaifenesin) .Travis Bond.. 1 by mouth two times a day 3)  Prednisone 10 Mg Tabs (Prednisone) .... Use as directed  Complete Medication List: 1)  Spiriva Handihaler 18 Mcg Caps (Tiotropium bromide monohydrate) .Travis Bond.. 1 capsule daily in handihaler 2)  Proventil Hfa 108 (90 Base) Mcg/act Aers (Albuterol sulfate) .... As needed 3)  Metoprolol Tartrate 25 Mg Tabs (Metoprolol tartrate) .... 1/2 by mouth two times a day 4)  Amlodipine Besylate 5 Mg Tabs (Amlodipine besylate) .Travis Bond.. 1 by mouth daily 5)  Isosorbide Mononitrate Cr 60 Mg Xr24h-tab (Isosorbide mononitrate) .Travis Bond.. 1 by mouth daily 6)  Aspirin 325 Mg Tabs (Aspirin) .Travis Bond.. 1 by mouth daily 7)  Plavix 75 Mg Tabs (Clopidogrel bisulfate) .Travis Bond.. 1 by mouth daily 8)  Pravastatin Sodium 20 Mg Tabs (Pravastatin sodium) .Travis Bond.. 1 by mouth at bedtime 9)  Niaspan 500 Mg Cr-tabs (Niacin (antihyperlipidemic)) .Travis Bond.. 1 by mouth at bedtime 10)  Fish Oil 1000 Mg Caps (Omega-3 fatty acids) .Travis Bond.. 1 by mouth daily 11)  Gabapentin 300 Mg Caps (Gabapentin) .... 2 by mouth daily 12)  Nitrostat 0.4 Mg Subl (Nitroglycerin) .... As needed subl 13)  Sertraline Hcl 50 Mg Tabs (Sertraline hcl) .Travis Bond.. 1 by  mouth daily 14)  Guaifenesin 400 Mg Tabs (Guaifenesin) .... Two times a day 15)  Symbicort 160-4.5 Mcg/act Aero (Budesonide-formoterol fumarate) .... Two puffs once daily 16)  Mucinex 600 Mg Xr12h-tab (Guaifenesin) .Travis Bond.. 1 by mouth two times a day 17)  Prednisone 10 Mg Tabs (Prednisone) .... Use as directed  Other Orders: Est. Patient Level III (04540) T-2 View CXR (71020TC)  Patient Instructions: 1)  Chest xray today 2)  Spiriva one puff once daily  3)  Symbicort two puffs once daily  4)  Don't start prednisone until after Dr. Craige Cotta calls about chest xray report 5)  Follow up in one month  Prescriptions: PREDNISONE 10 MG TABS (PREDNISONE) use as directed  #30 x 1   Entered and Authorized by:   Coralyn Helling MD   Signed by:   Coralyn Helling MD on 01/02/2010   Method used:   Print then Give to Patient   RxID:   [id number]

## 2010-09-30 NOTE — Assessment & Plan Note (Signed)
Summary: rov 2 months///kp   Visit Type:  Follow-up Copy to:  Dr. [name] Primary Provider/Referring Provider:  [facility], Dr. Leanord Hawking  CC:  COPD.  The patient says he has more mucus at night...sob with exertion.  History of Present Illness: 77 yo male with COPD, Pulmonary fibrosis, and hypoxemia.  He has been off prednisone for the past week.  He gets winded after about 1/4 mile.  He is limited in his activity more because of back pain.  He has occasional cough with clear sputum.  He uses his nebulizer once daily.  He uses his oxygen at night. off prednisone for one week    Current Medications (verified): 1)  Ipratropium Bromide 0.02 % Soln (Ipratropium Bromide) .... One Vial Four Times A Day As Needed 2)  Albuterol Sulfate (2.5 Mg/62ml) 0.083% Nebu (Albuterol Sulfate) .... One Vial Nebulized Four Times Per Day As Needed 3)  Proventil Hfa 108 (90 Base) Mcg/act Aers (Albuterol Sulfate) .... As Needed 4)  Aspir-Low 81 Mg Tbec (Aspirin) .... Once Daily 5)  Plavix 75 Mg Tabs (Clopidogrel Bisulfate) .Marland Kitchen.. 1 By Mouth Daily 6)  Coreg 3.125 Mg Tabs (Carvedilol) .... Take 1 Tablet By Mouth Two Times A Day 7)  Imdur 30 Mg Xr24h-Tab (Isosorbide Mononitrate) .... Two Times A Day 8)  Pravastatin Sodium 20 Mg Tabs (Pravastatin Sodium) .Marland Kitchen.. 1 By Mouth At Bedtime 9)  Niaspan 500 Mg Cr-Tabs (Niacin (Antihyperlipidemic)) .Marland Kitchen.. 1 By Mouth At Bedtime 10)  Fish Oil 1000 Mg Caps (Omega-3 Fatty Acids) .Marland Kitchen.. 1 By Mouth Daily 11)  Gabapentin 300 Mg Caps (Gabapentin) .... 2 By Mouth Daily 12)  Nitrostat 0.4 Mg Subl (Nitroglycerin) .... As Needed Subl 13)  Sertraline Hcl 50 Mg Tabs (Sertraline Hcl) .Marland Kitchen.. 1 By Mouth Daily 14)  Mucinex 600 Mg Xr12h-Tab (Guaifenesin) .Marland Kitchen.. 1 By Mouth Two Times A Day 15)  Xanax 0.25 Mg Tabs (Alprazolam) .... Take 1 Tab By Mouth At Bedtime As Needed 16)  Oxycodone Hcl 5 Mg Tabs (Oxycodone Hcl) .... Every 6 Hours As Needed Pain 17)  Mag-Ox 400 400 Mg Tabs (Magnesium  Oxide) .... Once Daily 18)  Flexeril 5 Mg Tabs (Cyclobenzaprine Hcl) .... As Needed  Allergies (verified): No Known Drug Allergies  Past History:  Past Medical History: Reviewed history from 01/02/2010 and no changes required. GOLD 4 COPD      - Spirometry 12/05/09 FEV1 1.20(47%), FVC 2.56(64%), FEV1% 46 Hypoxemia      - SpO2 86% room air with exertion 12/05/09      - 3 liters oxygen with exertion and sleep Pulmonary fibrosis      - ANA 12/05/09 titer 1:640 CAD s/p stenting Hypertension Hyperlipidemia Carotid stenosis Peripheral vascular disease Anemia Neuropathy B12 deficiency Pneumonia  Past Surgical History: Reviewed history from 12/05/2009 and no changes required. Inguinal hernia repair in 1994  Review of Systems       The patient complains of shortness of breath with activity and productive cough.  The patient denies shortness of breath at rest, non-productive cough, coughing up blood, chest pain, irregular heartbeats, acid heartburn, indigestion, weight change, abdominal pain, difficulty swallowing, sore throat, headaches, nasal congestion/difficulty breathing through nose, sneezing, hand/feet swelling, joint stiffness or pain, rash, change in color of mucus, and fever.    Vital Signs:  Patient profile:   77 year old male Height:      70 inches (177.80 cm) Weight:      161.13 pounds (73.24 kg) BMI:     23.20 O2 Sat:  95 % on Room air Temp:     98.0 degrees F (36.67 degrees C) oral Pulse rate:   76 / minute BP sitting:   124 / 78  (left arm) Cuff size:   regular  Vitals Entered By: Michel Bickers CMA (April 30, 2010 9:21 AM)  O2 Sat at Rest %:  95 O2 Flow:  Room air  Serial Vital Signs/Assessments:  Comments: 9:47 AM Ambulatory Pulse Oximetry  Resting; HR_74___    02 Sat__97% on room air___  Lap1 (185 feet)   HR__94___   02 Sat__98% on room air___ Lap2 (185 feet)   HR__99___   02 Sat__98% on room air___    Lap3 (185 feet)   HR__104___   02  Sat__92% on room air___  _x_Test Completed without Difficulty ___Test Stopped due to:  By: Michel Bickers CMA   CC: COPD.  The patient says he has more mucus at night...sob with exertion Comments Medications reviewed with the patient. Daytime phone verified. Michel Bickers CMA  April 30, 2010 9:22 AM   Physical Exam  General:  normal appearance and thin.   Nose:  clear nasal discharge Mouth:  wears dentures, no exudate Neck:  no JVD.   Chest Wall:  barrel chest.   Lungs:  diminished breath sounds, prolonged exhalation, no wheezing, no rales Heart:  regular rhythm and normal rate with 2/6 SM Abdomen:  thin, soft, nontender, no organomegaly Extremities:  no clubbing, cyanosis, edema, or deformity noted Cervical Nodes:  no significant adenopathy   Impression & Recommendations:  Problem # 1:  C O P D (ICD-496) This is stable.  He is to continue with his nebulizer.  Advised him that he can use this more often if needed.  Problem # 2:  HYPOXEMIA (ICD-799.02) He is to continue with supplemental oxygen at night.  Problem # 3:  PULMONARY FIBROSIS (ICD-515) Will repeat his chest xray today.  Depending on results of this will determine if he needs to resume prednisone.  Will also get copy of blood test results from Dr. Leanord Hawking.  Complete Medication List: 1)  Ipratropium Bromide 0.02 % Soln (Ipratropium bromide) .... One vial four times a day as needed 2)  Albuterol Sulfate (2.5 Mg/69ml) 0.083% Nebu (Albuterol sulfate) .... One vial nebulized four times per day as needed 3)  Proventil Hfa 108 (90 Base) Mcg/act Aers (Albuterol sulfate) .... As needed 4)  Aspir-low 81 Mg Tbec (Aspirin) .... Once daily 5)  Plavix 75 Mg Tabs (Clopidogrel bisulfate) .Marland Kitchen.. 1 by mouth daily 6)  Coreg 3.125 Mg Tabs (Carvedilol) .... Take 1 tablet by mouth two times a day 7)  Imdur 30 Mg Xr24h-tab (Isosorbide mononitrate) .... Two times a day 8)  Pravastatin Sodium 20 Mg Tabs (Pravastatin sodium) .Marland Kitchen.. 1 by mouth at  bedtime 9)  Niaspan 500 Mg Cr-tabs (Niacin (antihyperlipidemic)) .Marland Kitchen.. 1 by mouth at bedtime 10)  Fish Oil 1000 Mg Caps (Omega-3 fatty acids) .Marland Kitchen.. 1 by mouth daily 11)  Gabapentin 300 Mg Caps (Gabapentin) .... 2 by mouth daily 12)  Nitrostat 0.4 Mg Subl (Nitroglycerin) .... As needed subl 13)  Sertraline Hcl 50 Mg Tabs (Sertraline hcl) .Marland Kitchen.. 1 by mouth daily 14)  Mucinex 600 Mg Xr12h-tab (Guaifenesin) .Marland Kitchen.. 1 by mouth two times a day 15)  Xanax 0.25 Mg Tabs (Alprazolam) .... Take 1 tab by mouth at bedtime as needed 16)  Oxycodone Hcl 5 Mg Tabs (Oxycodone hcl) .... Every 6 hours as needed pain 17)  Mag-ox 400 400 Mg Tabs (Magnesium oxide) .Marland KitchenMarland KitchenMarland Kitchen  Once daily 18)  Flexeril 5 Mg Tabs (Cyclobenzaprine hcl) .... As needed  Other Orders: Est. Patient Level III (16109) T-2 View CXR (71020TC)  Patient Instructions: 1)  Will get copy of lab test results from Dr. Leanord Hawking 2)  Chest xray today>>will call with results 3)  Follow up in 3 to 4 months

## 2010-09-30 NOTE — Progress Notes (Signed)
Summary: order for home health service  Phone Note Other Incoming   Summary of Call: See previous phone note on 01/15/2010. Zackery Barefoot CMA  Jan 15, 2010 4:29 PM

## 2010-09-30 NOTE — Procedures (Signed)
Summary: Oximetry/[facility]  [facility]xim[facility]   Imported By: Sherian Rein 01/07/2010 08:45:13  _____________________________________________________________________  External Attachment:    Type:   Image     Comment:   External Document

## 2010-09-30 NOTE — Miscellaneous (Signed)
Summary: Discharge/[facility]  Discharge/[facility]   Imported By: Sherian Rein 04/18/2010 11:20:52  _____________________________________________________________________  External Attachment:    Type:   Image     Comment:   External Document

## 2010-09-30 NOTE — Progress Notes (Signed)
Summary: rx request  Phone Note Call from Patient   Caller: Daughter Call For: sood Summary of Call: pt's daughter requests a fax be sent to [facility] for pt's nebulizor. neds 3 rx's: xopenex, albuterol sulfate, and ipratropium bromide. daughter rene hindson [phone] Initial call taken by: Tivis Ringer, CNA,  February 03, 2010 12:25 PM  Follow-up for Phone Call        [facility] because xopenex was removed from pt list at 01-31-10 OV. need to discuss before rx sent. Carron Curie CMA  February 03, 2010 2:17 PM  spoke with daughter and she states she still has xopenex on pt med list, but that they can d/c it if that is what VS recs because they feel it really is needed anyways. Pt is also using proventilinhaler. Please advise if you want pt to be on ipratropium, albuterol, xopenex and proventil inhaler. Also advise if ok to send in refills to [facility]. Thanks.   Additional Follow-up for Phone Call Additional follow up Details #1::        as detailed in my patient instructions, he is to use albuterol and ipratropium in nebulizer up to four times per day as needed.  he is to use proventil two puffs up to four times per day prn.  he does not need to use xopenex.  I had stopped his symbicort and spiriva at his last visit.  it is okay to send refills to [facility]. Additional Follow-up by: Coralyn Helling MD,  February 03, 2010 3:17 PM     Appended Document: rx request LMTCB  Appended Document: rx request see append.

## 2010-09-30 NOTE — Progress Notes (Signed)
Summary: [facility] in [facility] with [facility]F  Phone Note Call from Patient   Caller: Daughter [phone]- RENEE Call For: SOOD Summary of Call: ON PREDISONE DOSE PACK AND HAVING NOSE BLEEDS STILL BREATHING ISSUES IRRITATED AND DOESNT HAVE ENOUGH PILLS TO LAST TILL APP SOME CONFUSING ON THIS RX Initial call taken by: Lacinda Axon,  Jan 10, 2010 9:55 AM  Follow-up for Phone Call        [place] spoke to pt daughter and she states pt was confused about his directions for his prednisone. I advised according to appendof CXR that pt is to take 40mg  x 2 weeks then 30mg  daily until next ov in June. She states she does not think the pt haas been taking them correctly. [place] She states he seemed confused and irritated when she spoke to him on the phone. He also c/o nose bleeds inthe Am after using oxygen and staets nasal passages are dry. I advised he could use soem OTC nasal spray to help keep nose moist. She also states he does not have enough pred tabs to last until next appt. She willc all back [place] She states she will also speak to the pt and see how exactly he has been taking the prednisone. She will call back. Carron Curie CMA  Jan 10, 2010 10:19 AM   Additional Follow-up for Phone Call Additional follow up Details #1::        Called and spoke with pt's daughter Luster Landsberg.  She states that pt was admitted to hospital in [facility] over the wkend with CHF.  She states that he is having very bad nose bleeds and coughing up thick clots of blood.  She states that pulmonologist there wants to do bronch, but she is unusure if they will be able to do this.  She states that she will call to sched appt with [facility] as soon as they get back to [place].  I advised her to sign records release while there if possible and bring records to next appt which she agreed to do.  Will forward to Dr Craige Cotta as Lorain Childes Additional Follow-up by: Vernie Murders,  Jan 13, 2010 2:51 PM

## 2010-09-30 NOTE — Medication Information (Signed)
Summary: Nebulizer & Meds/[facility]  N[facility]ulizer & Meds/[facility]   Imported By: Sherian Rein 02/20/2010 11:54:52  _____________________________________________________________________  External Attachment:    Type:   Image     Comment:   External Document

## 2010-09-30 NOTE — Progress Notes (Signed)
Summary: set up home healthcare  Phone Note From Other Clinic   Caller: [facility] Call For: [facility] Summary of Call: want to see about [place] setting up Home health for when pt is discharged on Sat.  Please contact Tammie Mcpherson at [phone] - Case Manager. Initial call taken by: Eugene Gavia,  Jan 15, 2010 9:57 AM  Follow-up for Phone Call        Pt is being discharged from hospital in [facility] tomorrow abnd the daughter is requesting home health be ordered because pt is home during the day alone because daughter works full time. Tammie at [facility] hospital states that pt does nto have PCP at this time and wants to know if Dr. Craige Cotta would ok order for home health until pt can establish a PCP in [place]. Pt is coming to [place] to live with daughter. Pelase advise.Carron Curie CMA  Jan 15, 2010 10:44 AM   Additional Follow-up for Phone Call Additional follow up Details #1::        That is fine.  Can you please place an ordered to arrange for home health evaluation. Additional Follow-up by: Coralyn Helling MD,  Jan 15, 2010 3:56 PM     Appended Document: set up home healthcare order placed

## 2010-09-30 NOTE — Letter (Signed)
Summary: CMN for Nebulizer/[facility]  CMN for Nebulizer/[facility]   Imported By: Sherian Rein 03/25/2010 09:12:16  _____________________________________________________________________  External Attachment:    Type:   Image     Comment:   External Document

## 2010-09-30 NOTE — Assessment & Plan Note (Signed)
Summary: coughing up a lot of plegm/apc   Copy to:  Dr. [name] Primary Provider/Referring Provider:  [facility]  CC:  The patient c/o sinus drainage and cough x1 week. He says the mucus is clear with brownish streaks through it. He says he has increased sob with any type of exertion. He says his breathing gets worse if he uses the nebulizer medications..  History of Present Illness: 77 yo male with COPD, Pulmonary fibrosis, and hypoxemia.  He has been getting more short of breath with increased wheeze and cough.  He is bringing up clear to brown sputum.  He has been getting more sinus congestion and has been getting nose bleeds.  He coughs up some blood when he gets his nose bleeds.  He has not had fever.  He denies abdominal pain.  He is only using albuterol once per day and ipratropium once per day.  He uses these at different times.  He was not aware that he could use them together, and that he could use them more than he is.   Current Medications (verified): 1)  Ipratropium Bromide 0.02 % Soln (Ipratropium Bromide) .... One Vial Four Times A Day As Needed 2)  Albuterol Sulfate (2.5 Mg/85ml) 0.083% Nebu (Albuterol Sulfate) .... One Vial Nebulized Four Times Per Day As Needed 3)  Proventil Hfa 108 (90 Base) Mcg/act Aers (Albuterol Sulfate) .... As Needed 4)  Prednisone 10 Mg Tabs (Prednisone) .... One Pill Alternating With 1/2 Pill Every Other Day For Two Weeks, Then 1/2 Pill Once Daily 5)  Aspir-Low 81 Mg Tbec (Aspirin) .... Once Daily 6)  Plavix 75 Mg Tabs (Clopidogrel Bisulfate) .Marland Kitchen.. 1 By Mouth Daily 7)  Coreg 3.125 Mg Tabs (Carvedilol) .... Take 1 Tablet By Mouth Two Times A Day 8)  Imdur 30 Mg Xr24h-Tab (Isosorbide Mononitrate) .... Two Times A Day 9)  Pravastatin Sodium 20 Mg Tabs (Pravastatin Sodium) .Marland Kitchen.. 1 By Mouth At Bedtime 10)  Niaspan 500 Mg Cr-Tabs (Niacin (Antihyperlipidemic)) .Marland Kitchen.. 1 By Mouth At Bedtime 11)  Fish Oil 1000 Mg Caps (Omega-3 Fatty Acids) .Marland Kitchen..  1 By Mouth Daily 12)  Gabapentin 300 Mg Caps (Gabapentin) .... 2 By Mouth Daily 13)  Nitrostat 0.4 Mg Subl (Nitroglycerin) .... As Needed Subl 14)  Sertraline Hcl 50 Mg Tabs (Sertraline Hcl) .Marland Kitchen.. 1 By Mouth Daily 15)  Mucinex 600 Mg Xr12h-Tab (Guaifenesin) .Marland Kitchen.. 1 By Mouth Two Times A Day 16)  Xanax 0.25 Mg Tabs (Alprazolam) .... Take 1 Tab By Mouth At Bedtime As Needed 17)  Januvia 50 Mg Tabs (Sitagliptin Phosphate) .... Once Daily 18)  Oxycodone Hcl 5 Mg Tabs (Oxycodone Hcl) .... Every 6 Hours As Needed Pain 19)  Mag-Ox 400 400 Mg Tabs (Magnesium Oxide) .... Once Daily 20)  Flexeril 5 Mg Tabs (Cyclobenzaprine Hcl) .... As Needed  Allergies (verified): No Known Drug Allergies  Past History:  Past Medical History: Reviewed history from 01/02/2010 and no changes required. GOLD 4 COPD      - Spirometry 12/05/09 FEV1 1.20(47%), FVC 2.56(64%), FEV1% 46 Hypoxemia      - SpO2 86% room air with exertion 12/05/09      - 3 liters oxygen with exertion and sleep Pulmonary fibrosis      - ANA 12/05/09 titer 1:640 CAD s/p stenting Hypertension Hyperlipidemia Carotid stenosis Peripheral vascular disease Anemia Neuropathy B12 deficiency Pneumonia  Past Surgical History: Reviewed history from 12/05/2009 and no changes required. Inguinal hernia repair in 1994  Review of Systems  The patient complains of shortness of breath with activity, productive cough, coughing up blood, sore throat, nasal congestion/difficulty breathing through nose, and change in color of mucus.  The patient denies chest pain, irregular heartbeats, acid heartburn, abdominal pain, difficulty swallowing, headaches, hand/feet swelling, rash, and fever.    Vital Signs:  Patient profile:   77 year old male Height:      70 inches (177.80 cm) Weight:      150 pounds (68.18 kg) BMI:     21.60 O2 Sat:      93 % on 2 L/min Temp:     97.3 degrees F (36.28 degrees C) oral Pulse rate:   100 / minute BP sitting:   118 /  66  (left arm) Cuff size:   regular  Vitals Entered By: Michel Bickers CMA (February 26, 2010 9:27 AM)  O2 Sat at Rest %:  93 O2 Flow:  2 L/min CC: The patient c/o sinus drainage and cough x1 week. He says the mucus is clear with brownish streaks through it. He says he has increased sob with any type of exertion. He says his breathing gets worse if he uses the nebulizer medications. Comments Medications reviewed. Daytime phone verified. Michel Bickers CMA  February 26, 2010 9:28 AM   Physical Exam  General:  on supplemental oxygen.   Nose:  clear nasal discharge Mouth:  wears dentures, no exudate Neck:  no JVD.   Lungs:  diminished breath sounds, prolonged exhalation, no wheezing, no rales Heart:  regular rhythm and normal rate with 2/6 SM Abdomen:  thin, soft, nontender, no organomegaly Extremities:  no clubbing, cyanosis, edema, or deformity noted Cervical Nodes:  no significant adenopathy   Impression & Recommendations:  Problem # 1:  C O P D (ICD-496) He has an exacerbation.  I will give him a course of zithromax.  I don't think he needs to have an increase in his prednisone.  I have advised him to use his albuterol and atrovent together, and that he can use this more often.  Problem # 2:  PULMONARY FIBROSIS (ICD-515) He is to continue prednisone 5 mg every other day.  Will repeat his chest xray today.  Problem # 3:  HYPOXEMIA (ICD-799.02) He is to continue on 3 liters with exertion and sleep.  Problem # 4:  PERIPHERAL NEUROPATHY, IDIOPATHIC (ICD-356.9) I have refilled his xanax and neurontin until he can get his appointment with primary care in August.  Medications Added to Medication List This Visit: 1)  Zithromax Z-pak 250 Mg Tabs (Azithromycin) .... Use as directed 2)  Prednisone 5 Mg Tabs (Prednisone) .... One by mouth every other day  Complete Medication List: 1)  Ipratropium Bromide 0.02 % Soln (Ipratropium bromide) .... One vial four times a day as needed 2)  Albuterol  Sulfate (2.5 Mg/21ml) 0.083% Nebu (Albuterol sulfate) .... One vial nebulized four times per day as needed 3)  Proventil Hfa 108 (90 Base) Mcg/act Aers (Albuterol sulfate) .... As needed 4)  Aspir-low 81 Mg Tbec (Aspirin) .... Once daily 5)  Plavix 75 Mg Tabs (Clopidogrel bisulfate) .Marland Kitchen.. 1 by mouth daily 6)  Coreg 3.125 Mg Tabs (Carvedilol) .... Take 1 tablet by mouth two times a day 7)  Imdur 30 Mg Xr24h-tab (Isosorbide mononitrate) .... Two times a day 8)  Pravastatin Sodium 20 Mg Tabs (Pravastatin sodium) .Marland Kitchen.. 1 by mouth at bedtime 9)  Niaspan 500 Mg Cr-tabs (Niacin (antihyperlipidemic)) .Marland Kitchen.. 1 by mouth at bedtime 10)  Fish Oil 1000 Mg  Caps (Omega-3 fatty acids) .Marland Kitchen.. 1 by mouth daily 11)  Gabapentin 300 Mg Caps (Gabapentin) .... 2 by mouth daily 12)  Nitrostat 0.4 Mg Subl (Nitroglycerin) .... As needed subl 13)  Sertraline Hcl 50 Mg Tabs (Sertraline hcl) .Marland Kitchen.. 1 by mouth daily 14)  Mucinex 600 Mg Xr12h-tab (Guaifenesin) .Marland Kitchen.. 1 by mouth two times a day 15)  Xanax 0.25 Mg Tabs (Alprazolam) .... Take 1 tab by mouth at bedtime as needed 16)  Januvia 50 Mg Tabs (Sitagliptin phosphate) .... Once daily 17)  Oxycodone Hcl 5 Mg Tabs (Oxycodone hcl) .... Every 6 hours as needed pain 18)  Mag-ox 400 400 Mg Tabs (Magnesium oxide) .... Once daily 19)  Flexeril 5 Mg Tabs (Cyclobenzaprine hcl) .... As needed 20)  Zithromax Z-pak 250 Mg Tabs (Azithromycin) .... Use as directed 21)  Prednisone 5 Mg Tabs (Prednisone) .... One by mouth every other day  Other Orders: Est. Patient Level IV (99214) T-2 View CXR (71020TC)  Patient Instructions: 1)  Zithromax 250 mg pills: two pills on first day, then one pill once daily for four days 2)  Chest xray today 3)  Prednisone 5 mg every other day 4)  Follow up in 2 months Prescriptions: XANAX 0.25 MG TABS (ALPRAZOLAM) Take 1 tab by mouth at bedtime as needed  #30 x 1   Entered and Authorized by:   Coralyn Helling MD   Signed by:   Coralyn Helling MD on 02/26/2010    Method used:   Print then Give to Patient   RxID:   [id number] GABAPENTIN 300 MG CAPS (GABAPENTIN) 2 by mouth daily  #30 x 0   Entered and Authorized by:   Coralyn Helling MD   Signed by:   Coralyn Helling MD on 02/26/2010   Method used:   Electronically to        [id number] (retail)       [address]       [place], [place]  [address]       Ph: [phone]       Fax: [phone]   RxID:   [id number] PREDNISONE 5 MG TABS (PREDNISONE) one by mouth every other day  #30 x 2   Entered and Authorized by:   Coralyn Helling MD   Signed by:   Coralyn Helling MD on 02/26/2010   Method used:   Electronically to        [id number]* (retail)       [address]       [place], [place]  [address]       Ph: [phone]       Fax: [phone]   RxID:   [id number] ZITHROMAX Z-PAK 250 MG TABS (AZITHROMYCIN) use as directed  #1 x 0   Entered and Authorized by:   Coralyn Helling MD   Signed by:   Coralyn Helling MD on 02/26/2010   Method used:   Electronically to        [id number]* (retail)       [address]       [place], [place]  [address]       Ph: [phone]       Fax: [phone]   RxID:   [id number]

## 2010-09-30 NOTE — Miscellaneous (Signed)
Summary: Ambulatory oximetry 12/06/09  Clinical Lists Changes Test performed by [facility].  SpO2 90% with 3 liters easy pulse.  Will change set up accordingly. Orders: Added new Referral order of DME Referral (DME) - Signed

## 2010-09-30 NOTE — Procedures (Signed)
Summary: Oximetry / [facility]  Oximetry / [facility]   Imported By: Lennie Odor 12/19/2009 11:41:15  _____________________________________________________________________  External Attachment:    Type:   Image     Comment:   External Document

## 2010-09-30 NOTE — Progress Notes (Signed)
Summary: refax CMN  Phone Note From Other Clinic   Caller: judy w/ [facility] Call For: sood Summary of Call: please refax CMN/ neb to attn: judy fax # [phone]. contact # [phone] x [phone] Initial call taken by: Tivis Ringer, CNA,  April 07, 2010 11:26 AM  Follow-up for Phone Call        CMN from 7/21 was refaxed to Linda's attn at the requested fax #. Follow-up by: Vernie Murders,  April 07, 2010 11:49 AM

## 2010-09-30 NOTE — Progress Notes (Signed)
Summary: returned call  Phone Note Call from Patient   Caller: Daughter Call For: Travis Bond Summary of Call: pt's daughter returning call from dr Brittnei Jagiello "just a minute ago". rene hindson [phone] Initial call taken by: Tivis Ringer, CNA,  Jan 06, 2010 10:08 AM

## 2010-09-30 NOTE — Miscellaneous (Signed)
Summary: Plan of Care & Treatment/[facility]  Plan of Care & Treatment/[facility]   Imported By: Sherian Rein 03/17/2010 07:19:28  _____________________________________________________________________  External Attachment:    Type:   Image     Comment:   External Document

## 2010-09-30 NOTE — Miscellaneous (Signed)
Summary: change directions on neb rx  Clinical Lists Changes  Medications: Changed medication from IPRATROPIUM BROMIDE 0.06 % SOLN (IPRATROPIUM BROMIDE) one vial nebulized up to four times per day as needed to IPRATROPIUM BROMIDE 0.02 % SOLN (IPRATROPIUM BROMIDE) one vial four times a day as needed - Signed Changed medication from ALBUTEROL SULFATE (2.5 MG/3ML) 0.083% NEBU (ALBUTEROL SULFATE) one vial nebulized up to four times per day as needed to ALBUTEROL SULFATE (2.5 MG/3ML) 0.083% NEBU (ALBUTEROL SULFATE) one vial nebulized four times per day as needed - Signed Rx of IPRATROPIUM BROMIDE 0.02 % SOLN (IPRATROPIUM BROMIDE) one vial four times a day as needed;  #120 x 6;  Signed;  Entered by: Carron Curie CMA;  Authorized by: Coralyn Helling MD;  Method used: Print then Give to Patient Rx of ALBUTEROL SULFATE (2.5 MG/3ML) 0.083% NEBU (ALBUTEROL SULFATE) one vial nebulized four times per day as needed;  #120 x 6;  Signed;  Entered by: Carron Curie CMA;  Authorized by: Coralyn Helling MD;  Method used: Print then Give to Patient    Prescriptions: ALBUTEROL SULFATE (2.5 MG/3ML) 0.083% NEBU (ALBUTEROL SULFATE) one vial nebulized four times per day as needed  #120 x 6   Entered by:   Carron Curie CMA   Authorized by:   Coralyn Helling MD   Signed by:   Carron Curie CMA on 02/04/2010   Method used:   Print then Give to Patient   RxID:   [id number] IPRATROPIUM BROMIDE 0.02 % SOLN (IPRATROPIUM BROMIDE) one vial four times a day as needed  #120 x 6   Entered by:   Carron Curie CMA   Authorized by:   Coralyn Helling MD   Signed by:   Carron Curie CMA on 02/04/2010   Method used:   Print then Give to Patient   RxID:   [id number]  received a fax from [facility] stating that the original rx that was sent stated to take nebulizer up to four times a day, but medicare will not pay if rx says "up to" so this needs to be removed. Ok per VS to change this in RX. I have repritned rx and  had VS sign this and faxed back to [facility]. Carron Curie CMA  February 04, 2010 5:04 PM

## 2010-09-30 NOTE — Miscellaneous (Signed)
Summary: Face to face encounter/[facility]  Face to face encounter/[facility]   Imported By: Sherian Rein 06/12/2010 14:43:13  _____________________________________________________________________  External Attachment:    Type:   Image     Comment:   External Document

## 2010-09-30 NOTE — Letter (Signed)
Summary: CMN for Oxygen/[facility]  CMN for Oxygen/[facility]   Imported By: Sherian Rein 12/31/2009 09:00:33  _____________________________________________________________________  External Attachment:    Type:   Image     Comment:   External Document

## 2010-09-30 NOTE — Miscellaneous (Signed)
Summary: Order/[facility]  Order/[facility]   Imported By: Lester [place] 02/26/2010 09:30:55  _____________________________________________________________________  External Attachment:    Type:   Image     Comment:   External Document

## 2010-09-30 NOTE — Miscellaneous (Signed)
Summary: Order/[facility]  Order/[facility]   Imported By: Lester [place] 02/20/2010 10:55:23  _____________________________________________________________________  External Attachment:    Type:   Image     Comment:   External Document

## 2010-10-02 NOTE — Assessment & Plan Note (Signed)
Summary: PER REMINDER LETTER/[facility]   Visit Type:  Follow-up Copy to:  Dr. [name] Primary Einar Nolasco/Referring Lanae Federer:  [facility], Dr. Leanord Hawking  CC:  COPD...pt says there have been no changes in his breathing...no complaints today.  History of Present Illness: 77 yo male with COPD, Pulmonary fibrosis, and hypoxemia.  He was treated for a sinus infection with antibiotics a few weeks ago.  He still has been getting some sinus congestion and post-nasal drip.  He does not feel this is getting into his chest.  He feels his breathing is doing okay.  He denies much cough, wheeze, or sputum.  He has not had fever or hemoptysis.  He uses his nebulizer two times a day.  Current Medications (verified): 1)  Ipratropium Bromide 0.02 % Soln (Ipratropium Bromide) .... One Vial Four Times A Day As Needed 2)  Albuterol Sulfate (2.5 Mg/2ml) 0.083% Nebu (Albuterol Sulfate) .... One Vial Nebulized Four Times Per Day As Needed 3)  Proventil Hfa 108 (90 Base) Mcg/act Aers (Albuterol Sulfate) .... As Needed 4)  Aspir-Low 81 Mg Tbec (Aspirin) .... Once Daily 5)  Plavix 75 Mg Tabs (Clopidogrel Bisulfate) .Marland Kitchen.. 1 By Mouth Daily 6)  Coreg 3.125 Mg Tabs (Carvedilol) .... Take 1 Tablet By Mouth Two Times A Day 7)  Imdur 60 Mg Xr24h-Tab (Isosorbide Mononitrate) .Marland Kitchen.. 1 By Mouth Daily 8)  Pravastatin Sodium 20 Mg Tabs (Pravastatin Sodium) .Marland Kitchen.. 1 By Mouth At Bedtime 9)  Niaspan 500 Mg Cr-Tabs (Niacin (Antihyperlipidemic)) .Marland Kitchen.. 1 By Mouth At Bedtime 10)  Fish Oil 1200 Mg Caps (Omega-3 Fatty Acids) .Marland Kitchen.. 1 By Mouth Daily 11)  Gabapentin 300 Mg Caps (Gabapentin) .... 2 By Mouth Daily 12)  Nitrostat 0.4 Mg Subl (Nitroglycerin) .... As Needed Subl 13)  Mucinex 600 Mg Xr12h-Tab (Guaifenesin) .Marland Kitchen.. 1 By Mouth Two Times A Day 14)  Xanax 0.25 Mg Tabs (Alprazolam) .... Take 1 Tab By Mouth At Bedtime As Needed 15)  Mag-Ox 400 400 Mg Tabs (Magnesium Oxide) .... Once Daily  Allergies (verified): No Known Drug  Allergies  Past History:  Past Medical History: Reviewed history from 01/02/2010 and no changes required. GOLD 4 COPD      - Spirometry 12/05/09 FEV1 1.20(47%), FVC 2.56(64%), FEV1% 46 Hypoxemia      - SpO2 86% room air with exertion 12/05/09      - 3 liters oxygen with exertion and sleep Pulmonary fibrosis      - ANA 12/05/09 titer 1:640 CAD s/p stenting Hypertension Hyperlipidemia Carotid stenosis Peripheral vascular disease Anemia Neuropathy B12 deficiency Pneumonia  Past Surgical History: Reviewed history from 12/05/2009 and no changes required. Inguinal hernia repair in 1994  Vital Signs:  Patient profile:   77 year old male Height:      70 inches (177.80 cm) Weight:      169.50 pounds (77.05 kg) BMI:     24.41 O2 Sat:      97 % on Room air Temp:     97.6 degrees F (36.44 degrees C) oral Pulse rate:   74 / minute BP sitting:   114 / 80  (left arm) Cuff size:   regular  Vitals Entered By: Michel Bickers CMA (August 01, 2010 1:37 PM)  O2 Sat at Rest %:  97 O2 Flow:  Room air CC: COPD...pt says there have been no changes in his breathing...no complaints today Comments Medications reviewed with patient Michel Bickers [facility]  August 01, 2010 1:38 PM   Physical Exam  General:  normal appearance and thin.   Nose:  clear nasal discharge Mouth:  wears dentures, no exudate Neck:  no JVD.   Lungs:  diminished breath sounds, prolonged exhalation, no wheezing, no rales Heart:  regular rhythm and normal rate with 2/6 SM Extremities:  no clubbing, cyanosis, edema, or deformity noted Neurologic:  normal CN II-XII and strength normal.   Cervical Nodes:  no significant adenopathy Psych:  alert and cooperative; normal mood and affect; normal attention span and concentration   Impression & Recommendations:  Problem # 1:  C O P D (ICD-496)  This is stable.  He is to continue with his nebulizer.  Advised him that he can use this more often if needed.  Problem # 2:   PULMONARY FIBROSIS (ICD-515) Will repeat his chest xray today.  Problem # 3:  RHINITIS (ICD-472.0) He is to use nasonex and nasal irrigation.  Problem # 4:  HYPOXEMIA (ICD-799.02)  He is to continue with supplemental oxygen at night.  Medications Added to Medication List This Visit: 1)  Imdur 60 Mg Xr24h-tab (Isosorbide mononitrate) .Marland Kitchen.. 1 by mouth daily 2)  Fish Oil 1200 Mg Caps (Omega-3 fatty acids) .Marland Kitchen.. 1 by mouth daily 3)  Nasonex 50 Mcg/act Susp (Mometasone furoate) .... Two sprays once daily  Complete Medication List: 1)  Ipratropium Bromide 0.02 % Soln (Ipratropium bromide) .... One vial four times a day as needed 2)  Albuterol Sulfate (2.5 Mg/109ml) 0.083% Nebu (Albuterol sulfate) .... One vial nebulized four times per day as needed 3)  Proventil Hfa 108 (90 Base) Mcg/act Aers (Albuterol sulfate) .... As needed 4)  Aspir-low 81 Mg Tbec (Aspirin) .... Once daily 5)  Plavix 75 Mg Tabs (Clopidogrel bisulfate) .Marland Kitchen.. 1 by mouth daily 6)  Coreg 3.125 Mg Tabs (Carvedilol) .... Take 1 tablet by mouth two times a day 7)  Imdur 60 Mg Xr24h-tab (Isosorbide mononitrate) .Marland Kitchen.. 1 by mouth daily 8)  Pravastatin Sodium 20 Mg Tabs (Pravastatin sodium) .Marland Kitchen.. 1 by mouth at bedtime 9)  Niaspan 500 Mg Cr-tabs (Niacin (antihyperlipidemic)) .Marland Kitchen.. 1 by mouth at bedtime 10)  Fish Oil 1200 Mg Caps (Omega-3 fatty acids) .Marland Kitchen.. 1 by mouth daily 11)  Gabapentin 300 Mg Caps (Gabapentin) .... 2 by mouth daily 12)  Nitrostat 0.4 Mg Subl (Nitroglycerin) .... As needed subl 13)  Mucinex 600 Mg Xr12h-tab (Guaifenesin) .Marland Kitchen.. 1 by mouth two times a day 14)  Xanax 0.25 Mg Tabs (Alprazolam) .... Take 1 tab by mouth at bedtime as needed 15)  Mag-ox 400 400 Mg Tabs (Magnesium oxide) .... Once daily 16)  Nasonex 50 Mcg/act Susp (Mometasone furoate) .... Two sprays once daily  Other Orders: Est. Patient Level III (78295) T-2 View CXR (71020TC)  Patient Instructions: 1)  Flu shot today 2)  Chest xray today 3)  Nasonex  two sprays once daily 4)  Follow up in 4 months   Immunization History:  Pneumovax Immunization History:    Pneumovax:  historical (09/01/2007)

## 2010-12-10 LAB — BASIC METABOLIC PANEL
BUN: 16 mg/dL (ref 6–23)
Calcium: 8.4 mg/dL (ref 8.4–10.5)
Chloride: 102 mEq/L (ref 96–112)
Creatinine, Ser: 0.82 mg/dL (ref 0.4–1.5)
GFR calc Af Amer: >60 mL/min (ref >60)
GFR calc non Af Amer: >60 mL/min (ref >60)
Glucose, Bld: 110 mg/dL — ABNORMAL HIGH (ref 70–99)
Glucose, Bld: 87 mg/dL (ref 70–99)
Potassium: 4.3 mEq/L (ref 3.5–5.1)
Potassium: 4.3 mEq/L (ref 3.5–5.1)
Sodium: 137 mEq/L (ref 135–145)

## 2010-12-10 LAB — CBC
HCT: 32.1 % — ABNORMAL LOW (ref 39.0–52.0)
Hemoglobin: 11.5 g/dL — ABNORMAL LOW (ref 13.0–17.0)
MCHC: 34.1 g/dL (ref 30.0–36.0)
MCV: 97.7 fL (ref 78.0–100.0)
Platelets: 121 10*3/uL — ABNORMAL LOW (ref 150–400)
RBC: 3.5 MIL/uL — ABNORMAL LOW (ref 4.22–5.81)
RDW: 17.4 % — ABNORMAL HIGH (ref 11.5–15.5)

## 2010-12-10 LAB — CARDIAC PANEL(CRET KIN+CKTOT+MB+TROPI)
Total CK: 51 U/L (ref 7–232)
Troponin I: 0.28 ng/mL — ABNORMAL HIGH (ref 0.00–0.06)

## 2010-12-10 LAB — PROTIME-INR
INR: 1.1 (ref 0.00–1.49)
Prothrombin Time: 14.6 seconds (ref 11.6–15.2)

## 2011-01-13 NOTE — Discharge Summary (Signed)
NAMEALIN, CHAVIRA            ACCOUNT NO.:  [id number]   MEDICAL RECORD NO.:  [id number]          PATIENT TYPE:  INP   LOCATION:  2507                         FACILITY:  [facility]   PHYSICIAN:  Georga Hacking, M.D.DATE OF BIRTH:  [birth year]   DATE OF ADMISSION:  12/14/2008  DATE OF DISCHARGE:  12/15/2008                               DISCHARGE SUMMARY   FINAL DIAGNOSES:  1. Coronary artery disease.      a.     Drug-eluting stent Xience 3.5 in the left anterior       descending, this admission.      b.     Residual disease with occluded right coronary artery with       bridging collaterals, not candidate for intervention.      c.     Moderate disease involving the circumflex.      d.     Moderate mitral regurgitation with normal left ventricular       function.  2. Chronic obstructive pulmonary disease with ongoing cigarette      smoking.  3. Diffuse peripheral vascular disease with bilateral carotid artery      stenosis, subclavin stenosis and reduced arterial brachial indices      in the legs.  4. Chronic obstructive pulmonary disease.  5. Hyperlipidemia under treatment.  6. Peripheral neuropathy.   PROCEDURES:  Cardiac catheterization and stenting of the LAD.   HISTORY:  This is a 77 year old male who has a history of hypertension,  hyperlipidemia and was hospitalized in [place] in January  with worsening shortness of breath and probably had pneumonia and had a  mild heart attack at that time.  He had a stress test and showed  inferior ischemia.  He was told he had blockages.  He was referred to  possible catheterization at [place], [place], but his daughter  lives is in [place] and asked me to see him for evaluation.  He was  also told that he has developed midsternal chest discomfort, exertion  and relieved with rest, but does not have rest angina.  He has  significant numbness and tingling of both legs and describes  claudication 1/2 block of  walking.  He has ongoing dyspnea with  exertion, ongoing cigarette smoking.  Please see the previously typed  history and physical for remainder of the details.   HOSPITAL COURSE:  EKG shows an IV conduction delay.  Previous laboratory  data showed normal renal function, normal CBC, normal PT and PTT.  The  patient was brought in for same-day cardiac catheterization.  Left  ventricular function was normal and there was moderate mitral  regurgitation.  He had 30-40% left main stenosis.  There was diffuse LAD  disease with calcification 50% proximal stenosis.  There was a mid 90%  stenosis following a second diagonal branch.  The second diagonal branch  also had 80% disease in a branch of it.  The circumflex branch had a 40-  50% stenosis in the marginal branch to right coronary artery was  occluded in the mid portion with bridging collaterals.  Dr. Peter Swaziland  placed  a Xience V stent 3.5 x 18 mm in the LAD and was postdilated with  a 3.75 noncompliant balloon.  He was given Plavix and tolerated this  well.  EKG following the procedure was fine.  He was to be seen in  cardiac rehab in the next day and is discharged on,   1. Isosorbide 30 mg daily.  2. Amlodipine 5 mg daily.  3. Gabapentin 300 mg daily.  4. Pravastatin 20 mg daily.  5. Metoprolol 12.5 mg b.i.d.  6. Mucinex 600 mg b.i.d.  7. Spiriva inhaler daily.  8. Aspirin 325 mg a day.  9. Plavix 75 mg daily.  10.Nitroglycerin p.r.n.   He is to follow up with me in 1 week and is to call up if there are  problems.      Georga Hacking, M.D.  Electronically Signed     WST/MEDQ  D:  12/14/2008  T:  12/15/2008  Job:  [id number]

## 2011-01-13 NOTE — Cardiovascular Report (Signed)
NAMEDUGLAS, HEIER NO.:  [id number]   MEDICAL RECORD NO.:  [id number]          PATIENT TYPE:  INP   LOCATION:  2507                         FACILITY:  [facility]   PHYSICIAN:  Georga Hacking, M.D.DATE OF BIRTH:  Jul 23, 1927   DATE OF PROCEDURE:  12/14/2008  DATE OF DISCHARGE:                            CARDIAC CATHETERIZATION   HISTORY:  A 77 year old male with diffuse vascular disease, who  presented with increasing dyspnea, some substernal tightness, and has  diffuse peripheral vascular disease.  He had an abnormal adenosine  Cardiolite scan with evidence of inferior ischemia.   PROCEDURE:  Left heart catheterization with coronary angiograms and left  ventriculogram.   COMMENTS ABOUT PROCEDURE:  The patient was done as an outpatient  initially.  He was prepped and draped in usual manner.  The right  femoral artery was entered with some difficulty.  The cath procedure was  done with a wire exchange technique.  The coronary arteries were  visualized using 6-French catheters and a 30 mL ventriculogram was  performed.  He had some mild angina following the procedure and received  nitroglycerin.   HEMODYNAMIC DATA:  Aorta postcontrast 106/51, LV postcontrast 106/7-12.   ANGIOGRAPHIC DATA:  Left ventriculogram:  Performed in the 30-degree RAO  projection.  The aortic valve is normal.  There is catheter-induced  mitral regurgitation noted.  Left main coronary artery is calcified with  mild diffuse disease estimated at 30%.  The LAD is heavily calcified.  There is moderate calcification proximal eccentric disease, worse being  50%, a second diagonal branch has a segmental 80% stenosis involving the  distal portion.  The LAD has a severe eccentric 90% stenosis after the  diagonal branch.  Distal vessel is of good quality.  Circumflex coronary  artery is calcified with mild-to-moderate diffuse disease.  Right  coronary artery is occluded in its midportion with a  well-developed  system of bridging collaterals as well as collaterals in the left  coronary artery.   IMPRESSION:  1. Diffuse coronary artery disease with mild-to-moderate left main      stenosis, diffuse left anterior descending disease proximally with      severe portion being in the midportion after the second diagonal      branch, mild-to-moderate circumflex disease, and occluded right      coronary artery with bridging collaterals.  2. Preserved left ventricular function, estimated ejection fraction 55-      60% with mild-to-moderate mitral regurgitation.   RECOMMENDATIONS:  Dr. Peter Swaziland reviewed the films.  We will consider  percutaneous intervention of the mid LAD stenosis and treat the other  disease medically.      Georga Hacking, M.D.  Electronically Signed     WST/MEDQ  D:  12/14/2008  T:  12/14/2008  Job:  [id number]

## 2011-01-13 NOTE — Cardiovascular Report (Signed)
Travis Bond, OSMOND NO.:  [id number]   MEDICAL RECORD NO.:  [id number]          PATIENT TYPE:  INP   LOCATION:  2507                         FACILITY:  [facility]   PHYSICIAN:  Peter M. Swaziland, M.D.  DATE OF BIRTH:  1927/05/19   DATE OF PROCEDURE:  12/14/2008  DATE OF DISCHARGE:                            CARDIAC CATHETERIZATION   INDICATION FOR PROCEDURE:  A 77 year old white male presents with  symptoms of unstable angina.  He had an abnormal stress Cardiolite  study.  He has multiple medical condition include peripheral vascular  disease, chronic tobacco abuse, hypertension, and hyperlipidemia.  Diagnostic cardiac catheterization performed by Dr. Donnie Aho showed  occlusion of the proximal right coronary artery, which was well  collateralized by right to right collaterals.  The LAD had a 90%  stenosis in the midvessel.  This was a very large vessel.  After  consideration, it was felt that he would be best treated with stenting  of the LAD and then medical treatment for his other disease.   PROCEDURE:  Intracoronary stenting of the mid LAD.  Access was via the  right femoral artery using a 6-French arterial sheath.  Equipment 6-  [place] FL-4 guide and 0.014 P[place]ater wire, apex 3.0 x 12-mm balloon,  XIENCE 3.5 x 18-mm stent and an Hayes sprinter 3.75 x 15-mm post dual  balloon.   CONTRAST:  Additional 150 mL was used over his diagnostic study.   ADDITIONAL MEDICATIONS:  The patient received Plavix 600 mg p.o.,  Angiomax 0.75 mg/kg IV bolus followed by 1.75 mg/kg/hour followed by ACT  of 321.  He received nitroglycerin 100 mcg intracoronary x1.   After initial guide shots were obtained, the patient was anticoagulated  where we were able to cross the lesion in the LAD without difficulty  using a wire.  We predilated the lesion using a 3.0 x 12-mm apex up to 6  atmospheres x2.  We then placed a 3.5 x 18-mm XIENCE drug-eluting stent.  This was fairly difficult to place  across the lesion, but we were able  to obtain good position and the stent was deployed at 9 atmospheres.  We  subsequently postdilated the stent using a 3.75 x 15-mm [place] Sprinter  balloon up to 10 atmospheres.  This yielded an excellent angiographic  result at the stented lesion with 0% residual stenosis and TIMI grade  III flow.  There was no compromise of the diagonal branch just proximal  to the lesion.  It was noted there was bulky eccentric plaque in the  proximal LAD that was calcified.  This was about 40-50% narrowed and  unchanged from his diagnostic study.   FINAL INTERPRETATION:  Successful intracoronary stenting of the mid-LAD.           ______________________________  Peter M. Swaziland, M.D.     PMJ/MEDQ  D:  12/14/2008  T:  12/14/2008  Job:  [id number]   cc:   Georga Hacking, M.D.

## 2011-01-13 NOTE — Procedures (Signed)
CAROTID DUPLEX EXAM   INDICATION:  Carotid bruit.   HISTORY:  Diabetes:  No  Cardiac:  MI  Hypertension:  Yes  Smoking:  Yes  Previous Surgery:  No  CV History:  Currently asymptomatic  Amaurosis Fugax No, Paresthesias No, Hemiparesis No                                       RIGHT             LEFT  Brachial systolic pressure:         128               118  Brachial Doppler waveforms:         Normal            Normal  Vertebral direction of flow:        Antegrade         Antegrade/atypical  DUPLEX VELOCITIES (cm/sec)  CCA peak systolic                   54                64  ECA peak systolic                   154               90  ICA peak systolic                   135               130  ICA end diastolic                   48                41  PLAQUE MORPHOLOGY:                  Mixed             Mixed  PLAQUE AMOUNT:                      Mild              Mild  PLAQUE LOCATION:                    ICA/ECA           ICA/ECA   IMPRESSION:  1. A 40-59% stenosis of the bilateral internal carotid arteries.  2. Elevated velocity of 260 cm per second noted in the left proximal      subclavian artery with the antegrade left vertebral artery waveform      demonstrating early systolic deceleration.  3. A preliminary report was faxed to Dr. York Spaniel office on December 10, 2008.   ___________________________________________  Janetta Hora. Fields, MD   CH/MEDQ  D:  12/10/2008  T:  12/10/2008  Job:  [id number]

## 2011-01-19 ENCOUNTER — Ambulatory Visit (INDEPENDENT_AMBULATORY_CARE_PROVIDER_SITE_OTHER): Payer: Medicare Other | Admitting: Adult Health

## 2011-01-19 ENCOUNTER — Other Ambulatory Visit: Payer: Self-pay | Admitting: *Deleted

## 2011-01-19 ENCOUNTER — Encounter: Payer: Self-pay | Admitting: Adult Health

## 2011-01-19 VITALS — BP 130/70 | HR 73 | Temp 96.8°F | Ht 71.0 in | Wt 172.0 lb

## 2011-01-19 DIAGNOSIS — J449 Chronic obstructive pulmonary disease, unspecified: Secondary | ICD-10-CM

## 2011-01-19 MED ORDER — AMOXICILLIN-POT CLAVULANATE 875-125 MG PO TABS: 1.0000 | ORAL_TABLET | Freq: Two times a day (BID) | ORAL | Status: AC

## 2011-01-19 MED ORDER — PREDNISONE 10 MG PO TABS: ORAL_TABLET | ORAL | Status: AC

## 2011-01-19 NOTE — Assessment & Plan Note (Addendum)
Exacerbation w/ associated rhinitis flare  Plan:  Augmentin 875mg  Twice daily  For 7 days  Mucinex DM Twice daily  As needed  Cough/congestion Fluids and rest.  Prednisone taper over next week.  Please contact office for sooner follow up if symptoms do not improve or worsen or seek emergency care  follow up Dr. Craige Cotta  In 4 weeks and As needed   Saline nasal rinses As needed   Claritin 10mg  daily As needed  Drainage

## 2011-01-19 NOTE — Progress Notes (Signed)
  Subjective:    Patient ID: Travis Bond, male    DOB: 01-17-1927, 77 y.o.   MRN: [id number]  HPI 77 yo WM with known hx of COPD, hypoxemia on home O2  and Pulmonary Fibrosis  01/19/11 Acute OV Presents for an acute office visit. Complains of    PMH:  GOLD 4 COPD  - Spirometry 12/05/09 FEV1 1.20(47%), FVC 2.56(64%), FEV1% 46  Hypoxemia  - SpO2 86% room air with exertion 12/05/09  - 3 liters oxygen with exertion and sleep  Pulmonary fibrosis  - ANA 12/05/09 titer 1:640    Review of Systems Constitutional:   No  weight loss, night sweats,  Fevers, chills, fatigue, or  lassitude.  HEENT:   No headaches,  Difficulty swallowing,  Tooth/dental problems, or  Sore throat,                + sneezing, itching, ear ache, nasal congestion, post nasal drip,   CV:  No chest pain,  Orthopnea, PND, swelling in lower extremities, anasarca, dizziness, palpitations, syncope.   GI  No heartburn, indigestion, abdominal pain, nausea, vomiting, diarrhea, change in bowel habits, loss of appetite, bloody stools.   Resp: + shortness of breath with exertion or at rest.    No non-productive cough,  No coughing up of blood.  No change in color of mucus.  No wheezing.  No chest wall deformity  Skin: no rash or lesions.  GU: no dysuria, change in color of urine, no urgency or frequency.  No flank pain, no hematuria   MS:  No joint pain or swelling.  No decreased range of motion.  No back pain.  Psych:  No change in mood or affect. No depression or anxiety.  No memory loss.         Objective:   Physical Exam  GEN: A/Ox3; pleasant , NAD, elderly male.   HEENT:  [place]/AT,  EACs-clear, TMs-wnl, NOSE-clear, THROAT-clear, no lesions, no postnasal drip or exudate noted.   NECK:  Supple w/ fair ROM; no JVD; normal carotid impulses w/o bruits; no thyromegaly or nodules palpated; no lymphadenopathy.  RESP  Clear  P & A; w/o, wheezes/ rales/ or rhonchi.no accessory muscle use, no dullness to  percussion  CARD:  RRR, no m/r/g  , no peripheral edema, pulses intact, no cyanosis or clubbing.  GI:   Soft & nt; nml bowel sounds; no organomegaly or masses detected.  Musco: Warm bil, no deformities or joint swelling noted.   Neuro: alert, no focal deficits noted.    Skin: Warm, no lesions or rashes       Assessment & Plan:

## 2011-01-19 NOTE — Patient Instructions (Signed)
Augmentin 875mg  Twice daily  For 7 days  Mucinex DM Twice daily  As needed  Cough/congestion Fluids and rest.  Prednisone taper over next week.  Please contact office for sooner follow up if symptoms do not improve or worsen or seek emergency care  follow up Dr. Craige Cotta  In 4 weeks and As needed   Saline nasal rinses As needed   Claritin 10mg  daily As needed  Drainage

## 2011-02-02 ENCOUNTER — Telehealth: Payer: Self-pay | Admitting: Pulmonary Disease

## 2011-02-02 NOTE — Telephone Encounter (Signed)
Spoke with pt daughter and she states that the pt has been having increased SOB, increased chest congestion, low grade fever, and overall does not feel well since this weekend. Pt is refusing to see anyone but Dr. Craige Cotta. I advised VS first appt is not until Wed 02-04-11 at 3:30. Pt daughter states she will take this appt but if she can convince him to come sooner she will call. I advised if he gets worse before appt to go to ER if after hours.Carron Curie, CMA

## 2011-02-04 ENCOUNTER — Other Ambulatory Visit (INDEPENDENT_AMBULATORY_CARE_PROVIDER_SITE_OTHER): Payer: Medicare Other

## 2011-02-04 ENCOUNTER — Ambulatory Visit (INDEPENDENT_AMBULATORY_CARE_PROVIDER_SITE_OTHER): Payer: Medicare Other | Admitting: Pulmonary Disease

## 2011-02-04 ENCOUNTER — Ambulatory Visit (INDEPENDENT_AMBULATORY_CARE_PROVIDER_SITE_OTHER)
Admission: RE | Admit: 2011-02-04 | Discharge: 2011-02-04 | Disposition: A | Discharge status: Home / Self Care | Payer: Medicare Other | Source: Ambulatory Visit | Attending: Pulmonary Disease | Admitting: Pulmonary Disease

## 2011-02-04 VITALS — BP 160/82 | HR 84 | Temp 98.2°F | Ht 70.0 in | Wt 170.6 lb

## 2011-02-04 DIAGNOSIS — J449 Chronic obstructive pulmonary disease, unspecified: Secondary | ICD-10-CM

## 2011-02-04 DIAGNOSIS — J4489 Other specified chronic obstructive pulmonary disease: Secondary | ICD-10-CM

## 2011-02-04 DIAGNOSIS — J9610 Chronic respiratory failure, unspecified whether with hypoxia or hypercapnia: Secondary | ICD-10-CM

## 2011-02-04 DIAGNOSIS — J8410 Pulmonary fibrosis, unspecified: Secondary | ICD-10-CM

## 2011-02-04 DIAGNOSIS — R0902 Hypoxemia: Secondary | ICD-10-CM

## 2011-02-04 DIAGNOSIS — J9611 Chronic respiratory failure with hypoxia: Secondary | ICD-10-CM

## 2011-02-04 LAB — CBC WITH DIFFERENTIAL/PLATELET
Basophils Absolute: 0 10*3/uL (ref 0.0–0.1)
Eosinophils Absolute: 0.3 10*3/uL (ref 0.0–0.7)
HCT: 36.4 % — ABNORMAL LOW (ref 39.0–52.0)
Hemoglobin: 12.6 g/dL — ABNORMAL LOW (ref 13.0–17.0)
Lymphs Abs: 1.5 10*3/uL (ref 0.7–4.0)
MCHC: 34.6 g/dL (ref 30.0–36.0)
MCV: 100.7 fl — ABNORMAL HIGH (ref 78.0–100.0)
Monocytes Absolute: 0.7 10*3/uL (ref 0.1–1.0)
Monocytes Relative: 12.3 % — ABNORMAL HIGH (ref 3.0–12.0)
Neutro Abs: 2.8 10*3/uL (ref 1.4–7.7)
RDW: 15.2 % — ABNORMAL HIGH (ref 11.5–14.6)

## 2011-02-04 LAB — COMPREHENSIVE METABOLIC PANEL
ALT: 14 U/L (ref 0–53)
AST: 19 U/L (ref 0–37)
Alkaline Phosphatase: 87 U/L (ref 39–117)
Creatinine, Ser: 1.2 mg/dL (ref 0.4–1.5)
Sodium: 138 mEq/L (ref 135–145)
Total Bilirubin: 0.5 mg/dL (ref 0.3–1.2)

## 2011-02-04 MED ORDER — PREDNISONE 5 MG PO TABS: ORAL_TABLET | ORAL | Status: DC

## 2011-02-04 NOTE — Patient Instructions (Signed)
Prednisone 5 mg pills as directed Chest xray and labs today>>will call with results Follow up in 6 to 8 weeks

## 2011-02-04 NOTE — Assessment & Plan Note (Signed)
Has pulmonary fibrosis with positive ANA.  Will repeat his labs, and CXR today.  Will give prednisone also.

## 2011-02-04 NOTE — Assessment & Plan Note (Signed)
He will need to use oxygen 24/7 for now.  Will re-assess at next visit.

## 2011-02-04 NOTE — Progress Notes (Signed)
Subjective:    Patient ID: Travis Bond, male    DOB: 30-Jan-1927, 77 y.o.   MRN: [id number]  HPI CC: Travis Bond  77 yo male with COPD, Pulmonary fibrosis, and hypoxemia.  He was recently treated with antibiotics and prednisone for increased shortness of breath.  This helped, but he got worse once prednisone was stopped.  He got nausea while using antibiotics, but this is better.  He has not had diarrhea.  He feels like his lungs are raw.  He is getting wheeze, mostly in the morning.  He is not having much cough or sputum anymore.  He denies fever.  He has been using his nebulizer twice per day and albuterol inhaler three times per day.  Past Medical History  Diagnosis Date  . COPD (chronic obstructive pulmonary disease)   . Chronic respiratory failure with hypoxia   . Pulmonary fibrosis   . Coronary artery disease   . Hypertension   . Hyperlipidemia   . Carotid stenosis   . Peripheral vascular disease   . Anemia   . Peripheral neuropathy   . B12 deficiency   . Pneumonia      No Known Allergies    Review of Systems     Objective:   Physical Exam BP 160/82  Pulse 84  Temp(Src) 98.2 F (36.8 C) (Oral)  Ht 5\' 10"  (1.778 m)  Wt 170 lb 9.6 oz (77.384 kg)  BMI 24.48 kg/m2  SpO2 93%  General: normal appearance and thin.  Nose: clear nasal discharge  Mouth: wears dentures, no exudate  Neck: no JVD.  Lungs: diminished breath sounds, prolonged exhalation, no wheezing, no rales  Heart: regular rhythm and normal rate with 2/6 SM  Extremities: no clubbing, cyanosis, edema, or deformity noted  Neurologic: normal CN II-XII and strength normal.  Cervical Nodes: no significant adenopathy  Psych: alert and cooperative; normal mood and affect; normal attention span and concentration      Assessment & Plan:   C O P D He has slow to resolve exacerbation.  Will check his CXR today.  Will give him slow taper of prednisone.  He is to continue with nebulizer therapy.  I  don't think he needs antibiotics.  PULMONARY FIBROSIS Has pulmonary fibrosis with positive ANA.  Will repeat his labs, and CXR today.  Will give prednisone also.  Chronic respiratory failure with hypoxia He will need to use oxygen 24/7 for now.  Will re-assess at next visit.    Updated Medication List Outpatient Encounter Prescriptions as of 02/04/2011  Medication Sig Dispense Refill  . albuterol (PROVENTIL) (2.5 MG/3ML) 0.083% nebulizer solution Take 2.5 mg by nebulization every 4 (four) hours as needed.        Marland Kitchen albuterol (PROVENTIL) 90 MCG/ACT inhaler Inhale 2 puffs into the lungs as needed.        . ALPRAZolam (XANAX) 0.25 MG tablet Take 0.25 mg by mouth at bedtime as needed.        Marland Kitchen aspirin 81 MG EC tablet Take 81 mg by mouth daily.        . carvedilol (COREG) 3.125 MG tablet Take 3.125 mg by mouth daily.        . clopidogrel (PLAVIX) 75 MG tablet Take 75 mg by mouth daily.        Marland Kitchen gabapentin (NEURONTIN) 300 MG capsule Take 300 mg by mouth. 2 by mouth daily        . guaiFENesin (MUCINEX) 600 MG 12 hr tablet Take 1,200 mg  by mouth 2 (two) times daily.        Marland Kitchen ipratropium (ATROVENT) 0.02 % nebulizer solution Take 500 mcg by nebulization 4 (four) times daily as needed.        . isosorbide mononitrate (IMDUR) 60 MG 24 hr tablet Take 60 mg by mouth daily.        . magnesium oxide (MAG-OX) 400 MG tablet Take 400 mg by mouth daily.        . niacin (NIASPAN) 500 MG CR tablet Take 500 mg by mouth at bedtime.        . nitroGLYCERIN (NITROSTAT) 0.4 MG SL tablet Place 0.4 mg under the tongue as needed.        . Omega-3 Fatty Acids (Bond OIL) 1200 MG CAPS Take by mouth daily.        . pravastatin (PRAVACHOL) 20 MG tablet Take 20 mg by mouth. 1 BY MOUTH AT BEDTIME       . predniSONE (DELTASONE) 5 MG tablet 4 pills per day for 3 days, 3 pills per day for 3 days, 2 pills per day for 3 days, 1 pill per day for 3 days, 1/2 pill per day for 3 days  40 tablet  1  . DISCONTD: mometasone (NASONEX) 50  MCG/ACT nasal spray 2 sprays by Nasal route daily. 2 SPRAYS ONCE DAILY

## 2011-02-04 NOTE — Assessment & Plan Note (Signed)
He has slow to resolve exacerbation.  Will check his CXR today.  Will give him slow taper of prednisone.  He is to continue with nebulizer therapy.  I don't think he needs antibiotics.

## 2011-02-05 LAB — ANTI-NUCLEAR AB-TITER (ANA TITER)

## 2011-02-06 ENCOUNTER — Telehealth: Payer: Self-pay | Admitting: Pulmonary Disease

## 2011-02-06 NOTE — Telephone Encounter (Signed)
Will have my nurse call to inform pt that chest xray showed expected changes of COPD, and labs were okay.  No change to current treatment plan.

## 2011-02-08 ENCOUNTER — Encounter: Payer: Self-pay | Admitting: Pulmonary Disease

## 2011-02-09 NOTE — Telephone Encounter (Signed)
Called and spoke with pt's daughter and informed her of cxr and lab results.  Daughter verbalized understanding and stated she would relay message to pt.

## 2011-02-09 NOTE — Telephone Encounter (Signed)
[email or web address]  

## 2011-02-09 NOTE — Telephone Encounter (Signed)
PATIENT'S DAUGHTER RENEE RETURNED CALL.  PLEASE CALL BACK AT [phone]

## 2011-02-25 ENCOUNTER — Ambulatory Visit: Payer: Medicare Other | Admitting: Pulmonary Disease

## 2011-03-26 ENCOUNTER — Ambulatory Visit: Payer: Medicare Other | Admitting: Pulmonary Disease

## 2011-03-31 ENCOUNTER — Telehealth: Payer: Self-pay | Admitting: Pulmonary Disease

## 2011-03-31 MED ORDER — ALBUTEROL 90 MCG/ACT IN AERS: 2.0000 | INHALATION_SPRAY | RESPIRATORY_TRACT | Status: DC | PRN

## 2011-03-31 NOTE — Telephone Encounter (Signed)
Pt last seen by VS 6.6.12, told to follow up in 6 weeks.  Pt noshowed for 7.26.12 appt and has been called to [facility].  Called [facility], gave pt #1 with no refills and a note that pt needs to contact the office and schedule appt.

## 2011-04-21 ENCOUNTER — Ambulatory Visit (INDEPENDENT_AMBULATORY_CARE_PROVIDER_SITE_OTHER): Payer: Medicare Other | Admitting: Pulmonary Disease

## 2011-04-21 ENCOUNTER — Encounter: Payer: Self-pay | Admitting: Pulmonary Disease

## 2011-04-21 DIAGNOSIS — J9611 Chronic respiratory failure with hypoxia: Secondary | ICD-10-CM

## 2011-04-21 DIAGNOSIS — J9610 Chronic respiratory failure, unspecified whether with hypoxia or hypercapnia: Secondary | ICD-10-CM

## 2011-04-21 DIAGNOSIS — J8410 Pulmonary fibrosis, unspecified: Secondary | ICD-10-CM

## 2011-04-21 DIAGNOSIS — J449 Chronic obstructive pulmonary disease, unspecified: Secondary | ICD-10-CM

## 2011-04-21 DIAGNOSIS — R0902 Hypoxemia: Secondary | ICD-10-CM

## 2011-04-21 NOTE — Assessment & Plan Note (Signed)
Stable on his current regimen 

## 2011-04-21 NOTE — Patient Instructions (Signed)
Follow up in 6 months 

## 2011-04-21 NOTE — Assessment & Plan Note (Signed)
He is to continue with oxygen with exertion and sleep.  I am not sure how much he is actually using his oxygen.

## 2011-04-21 NOTE — Assessment & Plan Note (Signed)
Stable.  Monitor clinically. 

## 2011-04-21 NOTE — Progress Notes (Signed)
Subjective:    Patient ID: Travis Bond, male    DOB: 10/21/1926, 77 y.o.   MRN: [id number]  HPI CC: Travis Bond  77 yo male former smoker with  COPD, Pulmonary fibrosis, and hypoxemia.  His breathing has been doing okay.  He gets winded with exertion, but quickly recovers at rest.  He is not having much cough, wheeze, or sputum.  He has sinus congestion, but this is no worse than usual.  He uses his nebulizer twice per day and this helps.  Past Medical History  Diagnosis Date  . COPD (chronic obstructive pulmonary disease)   . Chronic respiratory failure with hypoxia   . Pulmonary fibrosis   . Coronary artery disease   . Hypertension   . Hyperlipidemia   . Carotid stenosis   . Peripheral vascular disease   . Anemia   . Peripheral neuropathy   . B12 deficiency   . Pneumonia      No family history on file.   History   Social History  . Marital Status: Single    Spouse Name: N/A    Number of Children: N/A  . Years of Education: N/A   Occupational History  . Not on file.   Social History Main Topics  . Smoking status: Former Smoker -- 1.0 packs/day for 65 years    Types: Cigarettes    Quit date: 01/18/2010  . Smokeless tobacco: Not on file  . Alcohol Use: Not on file  . Drug Use: Not on file  . Sexually Active: Not on file   Other Topics Concern  . Not on file   Social History Narrative  . No narrative on file     No Known Allergies       Review of Systems     Objective:   Physical Exam Temp(Src) 97.3 F (36.3 C) (Oral)  Ht 5\' 11"  (1.803 m)  Wt 172 lb 9.6 oz (78.291 kg)  BMI 24.07 kg/m2  General - thin HEENT - no sinus tenderness, clear nasal drainage, no oral exudate, no LAN Cardiac - s1s2 with 2/6 SM Chest - no wheeze Abd - soft, nontender Ext - no edema Neuro - normal strength Skin - no rashes Psych - normal mood/behavior     Assessment & Plan:   C O P D Stable on his current regimen.  Chronic respiratory failure with  hypoxia He is to continue with oxygen with exertion and sleep.  I am not sure how much he is actually using his oxygen.  PULMONARY FIBROSIS Stable.  Monitor clinically.    Updated Medication List Outpatient Encounter Prescriptions as of 04/21/2011  Medication Sig Dispense Refill  . albuterol (PROVENTIL) (2.5 MG/3ML) 0.083% nebulizer solution Take 2.5 mg by nebulization every 4 (four) hours as needed.        Marland Kitchen albuterol (PROVENTIL) 90 MCG/ACT inhaler Inhale 2 puffs into the lungs as needed.  17 g  0  . ALPRAZolam (XANAX) 0.25 MG tablet Take 0.25 mg by mouth at bedtime as needed.        Marland Kitchen aspirin 81 MG EC tablet Take 81 mg by mouth daily.        . carvedilol (COREG) 3.125 MG tablet Take 3.125 mg by mouth daily.        . clopidogrel (PLAVIX) 75 MG tablet Take 75 mg by mouth daily.        Marland Kitchen gabapentin (NEURONTIN) 300 MG capsule Take 300 mg by mouth. 2 by mouth daily        .  guaiFENesin (MUCINEX) 600 MG 12 hr tablet Take 1,200 mg by mouth 2 (two) times daily.        Marland Kitchen ipratropium (ATROVENT) 0.02 % nebulizer solution Take 500 mcg by nebulization 4 (four) times daily as needed.        . isosorbide mononitrate (IMDUR) 60 MG 24 hr tablet Take 60 mg by mouth daily.        . magnesium oxide (MAG-OX) 400 MG tablet Take 400 mg by mouth daily.        . niacin (NIASPAN) 500 MG CR tablet Take 500 mg by mouth at bedtime.        . nitroGLYCERIN (NITROSTAT) 0.4 MG SL tablet Place 0.4 mg under the tongue as needed.        . Omega-3 Fatty Acids (Bond OIL) 1200 MG CAPS Take by mouth daily.        . pravastatin (PRAVACHOL) 20 MG tablet Take 20 mg by mouth. 1 BY MOUTH AT BEDTIME       . DISCONTD: predniSONE (DELTASONE) 5 MG tablet 4 pills per day for 3 days, 3 pills per day for 3 days, 2 pills per day for 3 days, 1 pill per day for 3 days, 1/2 pill per day for 3 days  40 tablet  1

## 2011-06-29 ENCOUNTER — Other Ambulatory Visit (HOSPITAL_BASED_OUTPATIENT_CLINIC_OR_DEPARTMENT_OTHER): Payer: Self-pay | Admitting: Internal Medicine

## 2011-06-29 DIAGNOSIS — M545 Low back pain: Secondary | ICD-10-CM

## 2011-07-03 ENCOUNTER — Ambulatory Visit
Admission: RE | Admit: 2011-07-03 | Discharge: 2011-07-03 | Disposition: A | Discharge status: Home / Self Care | Payer: Medicare Other | Source: Ambulatory Visit | Attending: Internal Medicine | Admitting: Internal Medicine

## 2011-07-03 DIAGNOSIS — M545 Low back pain: Secondary | ICD-10-CM

## 2011-07-13 ENCOUNTER — Telehealth: Payer: Self-pay | Admitting: Pulmonary Disease

## 2011-07-13 NOTE — Telephone Encounter (Signed)
Error.Travis Bond ° °

## 2011-07-14 ENCOUNTER — Ambulatory Visit (INDEPENDENT_AMBULATORY_CARE_PROVIDER_SITE_OTHER): Payer: Medicare Other | Admitting: Adult Health

## 2011-07-14 ENCOUNTER — Encounter: Payer: Self-pay | Admitting: Adult Health

## 2011-07-14 ENCOUNTER — Other Ambulatory Visit: Payer: Self-pay

## 2011-07-14 ENCOUNTER — Encounter (HOSPITAL_COMMUNITY): Payer: Self-pay | Admitting: Emergency Medicine

## 2011-07-14 ENCOUNTER — Inpatient Hospital Stay (HOSPITAL_COMMUNITY)
Admission: EM | Admit: 2011-07-14 | Discharge: 2011-07-22 | DRG: 308 | Disposition: A | Discharge status: Home Health | Payer: Medicare Other | Attending: Cardiology | Admitting: Cardiology

## 2011-07-14 ENCOUNTER — Emergency Department (HOSPITAL_COMMUNITY): Payer: Medicare Other

## 2011-07-14 VITALS — BP 130/76 | HR 89 | Temp 95.1°F | Ht 70.5 in | Wt 185.0 lb

## 2011-07-14 DIAGNOSIS — G629 Polyneuropathy, unspecified: Secondary | ICD-10-CM | POA: Insufficient documentation

## 2011-07-14 DIAGNOSIS — R0602 Shortness of breath: Secondary | ICD-10-CM

## 2011-07-14 DIAGNOSIS — E785 Hyperlipidemia, unspecified: Secondary | ICD-10-CM | POA: Diagnosis present

## 2011-07-14 DIAGNOSIS — I499 Cardiac arrhythmia, unspecified: Secondary | ICD-10-CM

## 2011-07-14 DIAGNOSIS — I5031 Acute diastolic (congestive) heart failure: Secondary | ICD-10-CM | POA: Diagnosis present

## 2011-07-14 DIAGNOSIS — Z9981 Dependence on supplemental oxygen: Secondary | ICD-10-CM

## 2011-07-14 DIAGNOSIS — J9611 Chronic respiratory failure with hypoxia: Secondary | ICD-10-CM | POA: Insufficient documentation

## 2011-07-14 DIAGNOSIS — I119 Hypertensive heart disease without heart failure: Secondary | ICD-10-CM | POA: Insufficient documentation

## 2011-07-14 DIAGNOSIS — I4891 Unspecified atrial fibrillation: Secondary | ICD-10-CM

## 2011-07-14 DIAGNOSIS — I350 Nonrheumatic aortic (valve) stenosis: Secondary | ICD-10-CM | POA: Diagnosis present

## 2011-07-14 DIAGNOSIS — J439 Emphysema, unspecified: Secondary | ICD-10-CM | POA: Diagnosis present

## 2011-07-14 DIAGNOSIS — I959 Hypotension, unspecified: Secondary | ICD-10-CM | POA: Diagnosis present

## 2011-07-14 DIAGNOSIS — Z955 Presence of coronary angioplasty implant and graft: Secondary | ICD-10-CM | POA: Insufficient documentation

## 2011-07-14 DIAGNOSIS — J8410 Pulmonary fibrosis, unspecified: Secondary | ICD-10-CM | POA: Insufficient documentation

## 2011-07-14 DIAGNOSIS — I509 Heart failure, unspecified: Secondary | ICD-10-CM | POA: Diagnosis present

## 2011-07-14 DIAGNOSIS — I359 Nonrheumatic aortic valve disorder, unspecified: Secondary | ICD-10-CM | POA: Diagnosis present

## 2011-07-14 DIAGNOSIS — E039 Hypothyroidism, unspecified: Secondary | ICD-10-CM

## 2011-07-14 DIAGNOSIS — G609 Hereditary and idiopathic neuropathy, unspecified: Secondary | ICD-10-CM | POA: Diagnosis present

## 2011-07-14 DIAGNOSIS — J449 Chronic obstructive pulmonary disease, unspecified: Secondary | ICD-10-CM | POA: Diagnosis present

## 2011-07-14 DIAGNOSIS — I739 Peripheral vascular disease, unspecified: Secondary | ICD-10-CM | POA: Insufficient documentation

## 2011-07-14 DIAGNOSIS — I059 Rheumatic mitral valve disease, unspecified: Secondary | ICD-10-CM | POA: Diagnosis present

## 2011-07-14 DIAGNOSIS — I2510 Atherosclerotic heart disease of native coronary artery without angina pectoris: Secondary | ICD-10-CM | POA: Insufficient documentation

## 2011-07-14 DIAGNOSIS — Z87891 Personal history of nicotine dependence: Secondary | ICD-10-CM

## 2011-07-14 DIAGNOSIS — I340 Nonrheumatic mitral (valve) insufficiency: Secondary | ICD-10-CM

## 2011-07-14 DIAGNOSIS — J4489 Other specified chronic obstructive pulmonary disease: Secondary | ICD-10-CM | POA: Diagnosis present

## 2011-07-14 DIAGNOSIS — J438 Other emphysema: Secondary | ICD-10-CM

## 2011-07-14 HISTORY — DX: Unspecified atrial fibrillation: I48.91

## 2011-07-14 HISTORY — DX: Hypothyroidism, unspecified: E03.9

## 2011-07-14 LAB — COMPREHENSIVE METABOLIC PANEL
BUN: 20 mg/dL (ref 6–23)
Calcium: 9.3 mg/dL (ref 8.4–10.5)
Creatinine, Ser: 1.33 mg/dL (ref 0.50–1.35)
GFR calc Af Amer: 56 mL/min — ABNORMAL LOW (ref >90)
Glucose, Bld: 104 mg/dL — ABNORMAL HIGH (ref 70–99)
Total Protein: 6.8 g/dL (ref 6.0–8.3)

## 2011-07-14 LAB — CBC
HCT: 33.5 % — ABNORMAL LOW (ref 39.0–52.0)
Hemoglobin: 10.9 g/dL — ABNORMAL LOW (ref 13.0–17.0)
WBC: 4.7 10*3/uL (ref 4.0–10.5)

## 2011-07-14 LAB — CARDIAC PANEL(CRET KIN+CKTOT+MB+TROPI)
CK, MB: 2.7 ng/mL (ref 0.3–4.0)
Relative Index: INVALID (ref 0.0–2.5)
Total CK: 54 U/L (ref 7–232)
Troponin I: <0.3 ng/mL (ref <0.30)

## 2011-07-14 LAB — POCT I-STAT TROPONIN I: Troponin i, poc: 0.02 ng/mL (ref 0.00–0.08)

## 2011-07-14 LAB — DIFFERENTIAL
Basophils Absolute: 0 10*3/uL (ref 0.0–0.1)
Lymphocytes Relative: 34 % (ref 12–46)
Monocytes Absolute: 0.5 10*3/uL (ref 0.1–1.0)
Monocytes Relative: 10 % (ref 3–12)
Neutro Abs: 2.4 10*3/uL (ref 1.7–7.7)

## 2011-07-14 LAB — PROTIME-INR: INR: 1.37 (ref 0.00–1.49)

## 2011-07-14 LAB — GLUCOSE, CAPILLARY

## 2011-07-14 LAB — APTT: aPTT: 178 seconds — ABNORMAL HIGH (ref 24–37)

## 2011-07-14 MED ORDER — LEVALBUTEROL HCL 0.63 MG/3ML IN NEBU
0.6300 mg | INHALATION_SOLUTION | RESPIRATORY_TRACT | Status: DC | PRN
  Administered 2011-07-15: 0.63 mg via RESPIRATORY_TRACT
  Filled 2011-07-14: qty 3

## 2011-07-14 MED ORDER — IPRATROPIUM BROMIDE 0.02 % IN SOLN
0.5000 mg | Freq: Three times a day (TID) | RESPIRATORY_TRACT | Status: DC
  Administered 2011-07-14 – 2011-07-21 (×19): 0.5 mg via RESPIRATORY_TRACT
  Filled 2011-07-14 (×15): qty 2.5

## 2011-07-14 MED ORDER — DILTIAZEM HCL 100 MG IV SOLR
5.0000 mg/h | INTRAVENOUS | Status: DC
  Administered 2011-07-14: 5 mg/h via INTRAVENOUS
  Filled 2011-07-14: qty 100

## 2011-07-14 MED ORDER — HEPARIN (PORCINE) IN NACL 100-0.45 UNIT/ML-% IJ SOLN
1200.0000 [IU]/h | INTRAMUSCULAR | Status: DC
  Administered 2011-07-14 – 2011-07-18 (×5): 1200 [IU]/h via INTRAVENOUS
  Filled 2011-07-14 (×7): qty 250

## 2011-07-14 MED ORDER — DILTIAZEM LOAD VIA INFUSION
25.0000 mg | Freq: Once | INTRAVENOUS | Status: AC
  Administered 2011-07-14: 25 mg via INTRAVENOUS
  Filled 2011-07-14: qty 25

## 2011-07-14 MED ORDER — MORPHINE SULFATE 2 MG/ML IJ SOLN
2.0000 mg | Freq: Once | INTRAMUSCULAR | Status: AC
  Administered 2011-07-14: 2 mg via INTRAVENOUS
  Filled 2011-07-14: qty 1

## 2011-07-14 MED ORDER — FUROSEMIDE 10 MG/ML IJ SOLN
40.0000 mg | Freq: Two times a day (BID) | INTRAMUSCULAR | Status: DC
  Administered 2011-07-14 – 2011-07-15 (×3): 40 mg via INTRAVENOUS
  Filled 2011-07-14 (×4): qty 4

## 2011-07-14 MED ORDER — HEPARIN (PORCINE) IN NACL 100-0.45 UNIT/ML-% IJ SOLN
1200.0000 [IU]/h | INTRAMUSCULAR | Status: AC
  Administered 2011-07-14: 1200 [IU]/h via INTRAVENOUS
  Filled 2011-07-14: qty 250

## 2011-07-14 MED ORDER — SODIUM CHLORIDE 0.9 % IV BOLUS (SEPSIS)
250.0000 mL | Freq: Once | INTRAVENOUS | Status: AC
  Administered 2011-07-14: 250 mL via INTRAVENOUS

## 2011-07-14 MED ORDER — LEVALBUTEROL HCL 0.63 MG/3ML IN NEBU
0.6300 mg | INHALATION_SOLUTION | Freq: Three times a day (TID) | RESPIRATORY_TRACT | Status: DC
  Administered 2011-07-14 – 2011-07-21 (×18): 0.63 mg via RESPIRATORY_TRACT
  Filled 2011-07-14 (×26): qty 3

## 2011-07-14 MED ORDER — DILTIAZEM HCL 100 MG IV SOLR
INTRAVENOUS | Status: AC
  Administered 2011-07-14: 25 mg via INTRAVENOUS
  Filled 2011-07-14: qty 100

## 2011-07-14 MED ORDER — ASPIRIN 81 MG PO TBEC: 81.0000 mg | DELAYED_RELEASE_TABLET | Freq: Every day | ORAL | Status: DC

## 2011-07-14 MED ORDER — ASPIRIN EC 81 MG PO TBEC
81.0000 mg | DELAYED_RELEASE_TABLET | Freq: Every day | ORAL | Status: DC
  Administered 2011-07-14 – 2011-07-21 (×8): 81 mg via ORAL
  Filled 2011-07-14 (×8): qty 1

## 2011-07-14 MED ORDER — DILTIAZEM HCL 50 MG/10ML IV SOLN: 25.0000 mg | Freq: Once | INTRAVENOUS | Status: DC

## 2011-07-14 MED ORDER — FUROSEMIDE 10 MG/ML IJ SOLN
40.0000 mg | Freq: Once | INTRAMUSCULAR | Status: AC
  Administered 2011-07-14: 40 mg via INTRAVENOUS
  Filled 2011-07-14: qty 4

## 2011-07-14 MED ORDER — HEPARIN BOLUS VIA INFUSION
4000.0000 [IU] | Freq: Once | INTRAVENOUS | Status: AC
  Administered 2011-07-14: 4000 [IU] via INTRAVENOUS
  Filled 2011-07-14 (×2): qty 4000

## 2011-07-14 MED ORDER — LEVALBUTEROL HCL 0.63 MG/3ML IN NEBU: 0.6300 mg | INHALATION_SOLUTION | RESPIRATORY_TRACT | Status: DC | PRN

## 2011-07-14 NOTE — Assessment & Plan Note (Addendum)
New Onset Atrial Fib with RVR with associated volume overload.  O2 started . EMS transport to ER.  Plan:  Unable to get in touch with DR. Donnie Aho. Will send pt to ER to evaluate  Further with management of Afib. Via EMS Case discussed with Dr. Maple Hudson   Pulmonary available as needed for any additional pulmonary issues.

## 2011-07-14 NOTE — H&P (Addendum)
Admit date: 07/14/2011  Referring Physician:  Dr. Radford Pax Primary Cardiologist: Dr. [name] Chief complaint/reason for admission: Atrial fibrillation, shortness of breath  HPI:   This very complicated 77 year old male has a history of severe COPD and interstitial pulmonary fibrosis with chronic oxygen therapy. He has a history of coronary artery disease and had a drug-eluting stent placed to the LAD over 2 years ago and at that time had an occluded right coronary artery with collateral filling. He also has moderate to severe mitral regurgitation and also moderate to severe aortic stenosis. He is quite limited because of dyspnea as well as pulmonary fibrosis and has severe peripheral vascular disease. He currently lives with his daughter.  Over the past month has felt poorly with increasing malaise and fatigue as well as increasing dyspnea. Over the past 2 weeks he has had a 10 pound weight gain as well as edema and is simply felt poorly. He went to see his pulmonary physician this morning and was seen in his office at which time he had a heart rate up in the 200s and was found to be in rapid atrial fibrillation was sent over to the emergency room. Initial troponin was normal. He was started on a diltiazem drip but became somewhat hypotensive and the drip was stopped. Shortly after that he complained of chest pain. His EKG shows atrial fibrillation but no ischemic changes. Since then he was given morphine and continues to complain of dyspnea. He is quite limited because of dyspnea. He denies PND or orthopnea. He complains of an indigestion type feeling.   Past Medical History  Diagnosis Date  . COPD (chronic obstructive pulmonary disease)   . Chronic respiratory failure with hypoxia   . Pulmonary fibrosis   . Coronary artery disease   . Hypertension   . Hyperlipidemia   . Carotid stenosis   . Peripheral vascular disease   . Anemia   . Peripheral neuropathy   . B12 deficiency   .  Pneumonia   . CAD (coronary artery disease)   . Atrial fibrillation 07/14/2011     Past Surgical History  Procedure Date  . Inguinal hernia repair 1994    Allergies:  has no known allergies.     Family History:  History reviewed. No pertinent family history.  Social History  . Marital Status: Single   Social History Main Topics  . Smoking status: Former Smoker -- 1.0 packs/day for 65 years    Types: Cigarettes    Quit date: 01/18/2010  . Smokeless tobacco: Never Used  . Alcohol Use: No  . Drug Use: No   Social History Narrative   Divorced.  Lives with daughter    Review of Systems:  The patient has complained of significant malaise and fatigue. He denies any recent headaches.. He has had significant nausea. He denies recent angina but has had some chest discomfort last evening. He complains of some nausea. He has had nocturia and has had poor urinary output the past several days. He has chronic pain involving his legs is also noted a 10 pound weight gain as well as some edema. He wears chronic oxygen at home. Jodell Cipro as  noted above the remainder review of systems unremarkable.  Physical Exam: Blood pressure 98/66, pulse 54, temperature 98.2 F (36.8 C), temperature source Oral, resp. rate 19, SpO2 99.00%.    Pleasant elderly male who is in mild respiratory distress and is alert and oriented. Skin is warm and dry: Scattered rectum OCs noted. ENT:  EOMI PERRLA CNS clear fundi not examined pharynx is negative neck: Supple, mild JVD, no masses. Bilateral carotid bruits heard. Lungs: Increased AP diameter, reduced breath sounds, mild wheezing. Cardiac exam: PMI is quiet. Normal S1 and S2 2/6 systolic murmur heard at left sternal border and aortic area radiating to neck. Abdomen: Soft mildly distended. Nontender no rebound. GU and rectal: Deferred due to cardiac condition. Femoral pulses 1+ with bilateral femoral bruits. 2+ peripheral edema. Neurologic alert and oriented x3 cranial  nerves 2-12 intact. Sensory and motor grossly normal.  Labs:   Lab Results  Component Value Date   WBC 4.7 07/14/2011   HGB 10.9* 07/14/2011   HCT 33.5* 07/14/2011   MCV 102.4* 07/14/2011   PLT 120* 07/14/2011    Lab 07/14/11 1324  NA 137  K 4.2  CL 102  CO2 25  BUN 20  CREATININE 1.33  CALCIUM 9.3  PROT 6.8  BILITOT 0.8  ALKPHOS 84  ALT 33  AST 27  GLUCOSE 104*    CXR:  Interstitial lung  Disease.   ASSESSMENT AND PLAN:  1. Rapid atrial fibrillation 2. Chest discomfort with normal troponins 3. Severe interstitial lung disease COPD 4. Chronic respiratory care with hypoxemia 5. Coronary artery disease with previous stenting of the LAD 6. Severe peripheral last for disease 7. Hypotension 8. Hyperlipidemia under treatment 9. Significant mitral regurgitation and aortic valve disease with preserved systolic function 10. Acute diastolic congestive heart failure with elevation of BNP  Recommendations:  He has rapid atrial fibrillation at this time and his Cardizem was stopped because he became hypotensive. He will be placed on intravenous heparin and given a dose of Lasix. I will obtain a pulmonary consultation and we will continue oxygen therapy at this time. Check serial cardiac enzymes.   Signed: Darden Palmer. MD [facility] 07/14/2011, 4:25 PM

## 2011-07-14 NOTE — Progress Notes (Signed)
Reviewed and agree with plan.

## 2011-07-14 NOTE — ED Notes (Signed)
Per EMS: pt from PCP c/o SOB found to be in rapid afib rate 140-180bpm; pt sts SOB with exertion x 1 week; pt with edema noted to bilateral LE; pt denies CP; IV 22g L hand

## 2011-07-14 NOTE — Progress Notes (Signed)
Subjective:    Patient ID: Travis Bond, male    DOB: 08-05-1927, 77 y.o.   MRN: [id number]  HPI   77 yo male former smoker with  COPD, Pulmonary fibrosis, and hypoxemia on home O2.   07/14/2011 Acute OV  Pt presents for an acute office visit. Over last week has had more dyspnea and edema in legs. Has had some congestion and cough with clear to green mucus at times. Only mild wheezing at times. No fever or chest pain. Dyspnea is worse at night when he lies down.  Today in office his pulse is irregular. EKG shows Afib with RVR at 135 bpm.  He does not have any known hx of A-Fib.   He is followed by Dr. Donnie Aho in [place]. Attempted to call his office -unable to get in touch with answering service.    Past Medical History  Diagnosis Date  . COPD (chronic obstructive pulmonary disease)   . Chronic respiratory failure with hypoxia   . Pulmonary fibrosis   . Coronary artery disease   . Hypertension   . Hyperlipidemia   . Carotid stenosis   . Peripheral vascular disease   . Anemia   . Peripheral neuropathy   . B12 deficiency   . Pneumonia      No family history on file.   History   Social History  . Marital Status: Single    Spouse Name: N/A    Number of Children: N/A  . Years of Education: N/A   Occupational History  . Not on file.   Social History Main Topics  . Smoking status: Former Smoker -- 1.0 packs/day for 65 years    Types: Cigarettes    Quit date: 01/18/2010  . Smokeless tobacco: Not on file  . Alcohol Use: Not on file  . Drug Use: Not on file  . Sexually Active: Not on file   Other Topics Concern  . Not on file   Social History Narrative  . No narrative on file     No Known Allergies       Review of Systems Constitutional:   No  weight loss, night sweats,  Fevers, chills,  +fatigue, or  lassitude.  HEENT:   No headaches,  Difficulty swallowing,  Tooth/dental problems, or  Sore throat,                No sneezing, itching, ear ache,    ++nasal congestion, post nasal drip,   CV:  No chest pain,    PND,   anasarca, dizziness, palpitations, syncope.   GI  No heartburn, indigestion, abdominal pain, nausea, vomiting, diarrhea, change in bowel habits, loss of appetite, bloody stools.   Resp:   No coughing up of blood.   No chest wall deformity  Skin: no rash or lesions.  GU: no dysuria, change in color of urine, no urgency or frequency.  No flank pain, no hematuria   MS:  No joint pain or swelling.  No decreased range of motion.  No back pain.  Psych:  No change in mood or affect. No depression or anxiety.  No memory loss.         Objective:   Physical Exam  There were no vitals taken for this visit.  General - thin HEENT - no sinus tenderness, clear nasal drainage, no oral exudate, no LAN Lungs: coarse BS w/ bibasilar crackles  Cardiac -irregular Abd - soft, nontender Ext - no edema Neuro - normal strength Skin - no rashes  Psych - normal mood/behavior     Assessment & Plan:  A-fib New Onset Atrial Fib with RVR with associated volume overload.  Unable to get in touch with DR. Donnie Aho. Will send pt to ER to evaluate  Further with management of Afib. Via EMS Case discussed with Dr. Maple Hudson   Pulmonary available as needed for any additional pulmonary issues.        Updated Medication List Outpatient Encounter Prescriptions as of 07/14/2011  Medication Sig Dispense Refill  . albuterol (PROVENTIL) (2.5 MG/3ML) 0.083% nebulizer solution Take 2.5 mg by nebulization every 4 (four) hours as needed.        Marland Kitchen albuterol (PROVENTIL) 90 MCG/ACT inhaler Inhale 2 puffs into the lungs as needed.  17 g  0  . ALPRAZolam (XANAX) 0.25 MG tablet Take 0.25 mg by mouth at bedtime as needed.        Marland Kitchen aspirin 81 MG EC tablet Take 81 mg by mouth daily.        . carvedilol (COREG) 3.125 MG tablet Take 3.125 mg by mouth daily.        . clopidogrel (PLAVIX) 75 MG tablet Take 75 mg by mouth daily.        Marland Kitchen gabapentin  (NEURONTIN) 300 MG capsule Take 300 mg by mouth. 2 by mouth daily        . guaiFENesin (MUCINEX) 600 MG 12 hr tablet Take 1,200 mg by mouth 2 (two) times daily.        Marland Kitchen ipratropium (ATROVENT) 0.02 % nebulizer solution Take 500 mcg by nebulization 4 (four) times daily as needed.        . isosorbide mononitrate (IMDUR) 60 MG 24 hr tablet Take 60 mg by mouth daily.        . magnesium oxide (MAG-OX) 400 MG tablet Take 400 mg by mouth daily.        . niacin (NIASPAN) 500 MG CR tablet Take 500 mg by mouth at bedtime.        . nitroGLYCERIN (NITROSTAT) 0.4 MG SL tablet Place 0.4 mg under the tongue as needed.        . Omega-3 Fatty Acids (FISH OIL) 1200 MG CAPS Take by mouth daily.        . pravastatin (PRAVACHOL) 20 MG tablet Take 20 mg by mouth. 1 BY MOUTH AT BEDTIME

## 2011-07-14 NOTE — ED Provider Notes (Signed)
History     CSN: [id number] Arrival date & time: 07/14/2011 12:57 PM   First MD Initiated Contact with Patient 07/14/11 1316      Chief Complaint  Patient presents with  . Shortness of Breath  . Atrial Fibrillation    (Consider location/radiation/quality/duration/timing/severity/associated sxs/prior treatment) HPI Per EMS: pt from PCP c/o SOB found to be in rapid afib rate 140-180bpm; pt sts SOB with exertion x 1 week; pt with edema noted to bilateral LE; pt denies CP; IV 22g L hand.  Some chest pain.  Denies diaphoresis.  Hx of afib in the past along with pulmonary fibrosis. Symtoms have been intermittant with improvement with rest and worsening with exertion.  No radiation.  Past Medical History  Diagnosis Date  . COPD (chronic obstructive pulmonary disease)   . Chronic respiratory failure with hypoxia   . Pulmonary fibrosis   . Coronary artery disease   . Hypertension   . Hyperlipidemia   . Carotid stenosis   . Peripheral vascular disease   . Anemia   . Peripheral neuropathy   . B12 deficiency   . Pneumonia     Past Surgical History  Procedure Date  . Inguinal hernia repair 1994    History reviewed. No pertinent family history.  History  Substance Use Topics  . Smoking status: Former Smoker -- 1.0 packs/day for 65 years    Types: Cigarettes    Quit date: 01/18/2010  . Smokeless tobacco: Not on file  . Alcohol Use: Not on file      Review of Systems  Unable to perform ROS: Unstable vital signs    Allergies  Review of patient's allergies indicates no known allergies.  Home Medications   Current Outpatient Rx  Name Route Sig Dispense Refill  . ALBUTEROL SULFATE (2.5 MG/3ML) 0.083% IN NEBU Nebulization Take 2.5 mg by nebulization every 4 (four) hours as needed. Shortness of breath    . ALBUTEROL 90 MCG/ACT IN AERS Inhalation Inhale 2 puffs into the lungs as needed. Shortness of breath.     . ALPRAZOLAM 0.25 MG PO TABS Oral Take 0.25 mg by mouth at  bedtime as needed. sleep    . ASPIRIN 81 MG PO TBEC Oral Take 81 mg by mouth daily.      Marland Kitchen CARVEDILOL 3.125 MG PO TABS Oral Take 3.125 mg by mouth.     . CLOPIDOGREL BISULFATE 75 MG PO TABS Oral Take 75 mg by mouth daily.      Marland Kitchen GABAPENTIN 300 MG PO CAPS Oral Take 300 mg by mouth 2 (two) times daily. 2 by mouth daily     . GUAIFENESIN 600 MG PO TB12 Oral Take 1,200 mg by mouth 2 (two) times daily.      . IPRATROPIUM BROMIDE 0.02 % IN SOLN Nebulization Take 500 mcg by nebulization 4 (four) times daily as needed.     . ISOSORBIDE MONONITRATE ER 60 MG PO TB24 Oral Take 60 mg by mouth daily.      Marland Kitchen NIACIN (ANTIHYPERLIPIDEMIC) 500 MG PO TBCR Oral Take 500 mg by mouth at bedtime.      Marland Kitchen PRAVASTATIN SODIUM 20 MG PO TABS Oral Take 20 mg by mouth. 1 BY MOUTH AT BEDTIME     . NITROGLYCERIN 0.4 MG SL SUBL Sublingual Place 0.4 mg under the tongue as needed. Chest pain      BP 114/70  Pulse 136  Temp(Src) 97.6 F (36.4 C) (Oral)  Resp 22  SpO2 97%  Physical Exam  Constitutional: He is oriented to person, place, and time. He appears well-developed and well-nourished.  HENT:  Head: Normocephalic and atraumatic.  Eyes: Conjunctivae are normal. Pupils are equal, round, and reactive to light.  Neck: Neck supple. No tracheal deviation present.  Cardiovascular: An irregularly irregular rhythm present. Tachycardia present.          Date: 07/14/2011  Rate: 147  Rhythm: atrial fib  QRS Axis: normal  Intervals: normal  ST/T Wave abnormalities: nonspecific T wave changes  Conduction Disutrbances:none  Narrative Interpretation: Atrial fibrillation with RVR       Pulmonary/Chest: No accessory muscle usage. No respiratory distress. He has no wheezes. He has no rhonchi. He has rales.  Abdominal: He exhibits no distension. There is no tenderness.  Musculoskeletal: He exhibits edema.  Lymphadenopathy:    He has no cervical adenopathy.  Neurological: He is alert and oriented to person, place, and time.    Skin: Skin is warm and dry. No rash noted. He is not diaphoretic.  Psychiatric: He has a normal mood and affect. His behavior is normal.    ED Course  Procedures (including critical care time)  Labs Reviewed  CBC - Abnormal; Notable for the following:    RBC 3.27 (*)    Hemoglobin 10.9 (*)    HCT 33.5 (*)    MCV 102.4 (*)    RDW 16.0 (*)    Platelets 120 (*)    All other components within normal limits  PROTIME-INR - Abnormal; Notable for the following:    Prothrombin Time 17.1 (*)    All other components within normal limits  DIFFERENTIAL  POCT I-STAT TROPONIN I  COMPREHENSIVE METABOLIC PANEL  I-STAT TROPONIN I  PRO B NATRIURETIC PEPTIDE   Dg Chest Port 1 View  07/14/2011  *RADIOLOGY REPORT*  Clinical Data: Shortness of breath  PORTABLE CHEST - 1 VIEW  Comparison: 02/04/2011  Findings: Chronic interstitial markings/emphysematous changes. Increased bilateral lower lobe subpleural reticulation/fibrosis. No superimposed opacities suspicious for pneumonia. No pleural effusion or pneumothorax.  Mild cardiomegaly.  IMPRESSION: Chronic interstitial markings with lower lobe fibrosis.  No superimposed opacities suspicious for pneumonia.  Mild cardiomegaly.  Original Report Authenticated By: Charline Bills, M.D.     No diagnosis found.    MDM  The patient given Cardizem bolus and started on Cardizem drip  CRITICAL CARE Performed by: Nelva Nay L   Total critical care time: 30  Critical care time was exclusive of separately billable procedures and treating other patients.  Critical care was necessary to treat or prevent imminent or life-threatening deterioration.  Critical care was time spent personally by me on the following activities: development of treatment plan with patient and/or surrogate as well as nursing, discussions with consultants, evaluation of patient's response to treatment, examination of patient, obtaining history from patient or surrogate, ordering and  performing treatments and interventions, ordering and review of laboratory studies, ordering and review of radiographic studies, pulse oximetry and re-evaluation of patient's condition.     Patient's cardiologists notified and will admit.  Nelia Shi, MD 07/14/11 [phone]

## 2011-07-14 NOTE — Patient Instructions (Signed)
We are admitting you to hospital  follow up Dr. Craige Cotta  In 4 weeks and As needed

## 2011-07-14 NOTE — ED Notes (Signed)
Attempt to call report x 1, RN unable.  

## 2011-07-14 NOTE — Progress Notes (Signed)
ANTICOAGULATION CONSULT NOTE - Initial Consult  Pharmacy Consult for heparin Indication: atrial fibrillation  No Known Allergies  Patient Measurements:   Adjusted Body Weight:   Vital Signs: Temp: 98.2 F (36.8 C) (11/13 1428) Temp src: Oral (11/13 1428) BP: 100/68 mmHg (11/13 1558) Pulse Rate: 73  (11/13 1558)  Labs:  Basename 07/14/11 1324  HGB 10.9*  HCT 33.5*  PLT 120*  APTT --  LABPROT 17.1*  INR 1.37  HEPARINUNFRC --  CREATININE 1.33  CKTOTAL --  CKMB --  TROPONINI --   The CrCl is unknown because both a height and weight (above a minimum accepted value) are required for this calculation.  Medical History: Past Medical History  Diagnosis Date  . COPD (chronic obstructive pulmonary disease)   . Chronic respiratory failure with hypoxia   . Pulmonary fibrosis   . Coronary artery disease   . Hypertension   . Hyperlipidemia   . Carotid stenosis   . Peripheral vascular disease   . Anemia   . Peripheral neuropathy   . B12 deficiency   . Pneumonia   . CAD (coronary artery disease)   . Atrial fibrillation 07/14/2011    Medications:  See med rec  Assessment: Patient is an 77 y.o M admitted to the ED today with afib with RVR.    Goal of Therapy:  Heparin level 0.3-0.7 units/ml   Plan:  1) heparin 4000 units IV x1 bolus, then heparin gtt at 1200 units/hr  2) check 8 hour heparin level  Venice Liz P 07/14/2011,4:20 PM

## 2011-07-14 NOTE — Consult Note (Signed)
Chief Complaint  Patient presents with  . Shortness of Breath  . Atrial Fibrillation    HISTORY of PRESENT ILLNESS:  Travis Bond is a 77 y.o. male admitted on 07/14/2011 with new onset Atrial fibrillation.  He was seen at a [facility] office for followup on COPD and was noted to have a rapid heart rate consistent with A. fib and was transferred to [facility] ED for further care.   In the emergency department he was placed on Cardizem however, this had to be stopped secondary to hypotension.  He currently is being admitted by Dr. Arlyn Leak and is on intravenous heparin.  Chest x-ray at this time demonstrates chronic changes consistent with COPD. He reports gradual increase in exertional dyspnea over past month with particular increase in past 5 days. Also reports increased LE edema and pressure-like CP with radiation to the right arm. This pain is not clearly related to exertion.  PCCM is consulted for assistance in mgt of COPD.       Past Medical History  Diagnosis Date  . COPD (chronic obstructive pulmonary disease)   . Chronic respiratory failure with hypoxia   . Pulmonary fibrosis   . Coronary artery disease   . Hypertension   . Hyperlipidemia   . Carotid stenosis   . Peripheral vascular disease   . Anemia   . Peripheral neuropathy   . B12 deficiency   . Pneumonia   . CAD (coronary artery disease)   . Atrial fibrillation 07/14/2011    Past Surgical History  Procedure Date  . Inguinal hernia repair 1994    History reviewed. No pertinent family history.   reports that he quit smoking about 17 months ago. His smoking use included Cigarettes. He has a 65 pack-year smoking history. He has never used smokeless tobacco. He reports that he does not drink alcohol or use illicit drugs.  No Known Allergies  Medications Prior to Admission  Medication Dose Route Frequency Provider Last Rate Last Dose  . diltiazem (CARDIZEM) 1 mg/mL load via infusion 25 mg  25 mg  Intravenous Once Severiano Gilbert, PHARMD   25 mg at 07/14/11 1601  . diltiazem (CARDIZEM) 100 mg in dextrose 5 % 100 mL infusion  5 mg/hr Intravenous Titrated W Ashley Royalty., MD 5 mL/hr at 07/14/11 1420 5 mg/hr at 07/14/11 1420  . diltiazem (CARDIZEM) 100 MG injection        25 mg at 07/14/11 1350  . furosemide (LASIX) injection 40 mg  40 mg Intravenous Once Nelia Shi, MD   40 mg at 07/14/11 1333  . furosemide (LASIX) injection 40 mg  40 mg Intravenous Q12H W Ashley Royalty., MD      . heparin 100 units/mL bolus via infusion 4,000 Units  4,000 Units Intravenous Once Anh P Pham, PHARMD      . heparin ADULT infusion 100 units/mL (25000 units/250 mL)  1,200 Units/hr Intravenous To Major Anh P Pham, PHARMD      . morphine 2 MG/ML injection 2 mg  2 mg Intravenous Once Nelia Shi, MD   2 mg at 07/14/11 1622  . sodium chloride 0.9 % bolus 250 mL  250 mL Intravenous Once Nelia Shi, MD   250 mL at 07/14/11 1500  . DISCONTD: diltiazem (CARDIZEM) injection SOLN 25 mg  25 mg Intravenous Once Nelia Shi, MD       Medications Prior to Admission  Medication Sig Dispense Refill  . albuterol (PROVENTIL) (2.5 MG/3ML)  0.083% nebulizer solution Take 2.5 mg by nebulization every 4 (four) hours as needed. Shortness of breath      . albuterol (PROVENTIL,VENTOLIN) 90 MCG/ACT inhaler Inhale 2 puffs into the lungs as needed. Shortness of breath.       . ALPRAZolam (XANAX) 0.25 MG tablet Take 0.25 mg by mouth at bedtime as needed. sleep      . aspirin 81 MG EC tablet Take 81 mg by mouth daily.        . carvedilol (COREG) 3.125 MG tablet Take 3.125 mg by mouth.       . clopidogrel (PLAVIX) 75 MG tablet Take 75 mg by mouth daily.        Marland Kitchen gabapentin (NEURONTIN) 300 MG capsule Take 300 mg by mouth 2 (two) times daily. 2 by mouth daily       . guaiFENesin (MUCINEX) 600 MG 12 hr tablet Take 1,200 mg by mouth 2 (two) times daily.        Marland Kitchen ipratropium (ATROVENT) 0.02 % nebulizer solution Take  500 mcg by nebulization 4 (four) times daily as needed.       . isosorbide mononitrate (IMDUR) 60 MG 24 hr tablet Take 60 mg by mouth daily.        . niacin (NIASPAN) 500 MG CR tablet Take 500 mg by mouth at bedtime.        . pravastatin (PRAVACHOL) 20 MG tablet Take 20 mg by mouth. 1 BY MOUTH AT BEDTIME       . nitroGLYCERIN (NITROSTAT) 0.4 MG SL tablet Place 0.4 mg under the tongue as needed. Chest pain        ROS: Constitutional:   No  weight loss, night sweats,  Fevers, chills, fatigue, lassitude. HEENT:   No headaches,  Difficulty swallowing,  Tooth/dental problems,  Sore throat, No sneezing, itching, ear ache, nasal congestion, post nasal drip CV: Indicates dyspnea on exertion and with lying flat, LE swelling.  GI  No heartburn, indigestion, abdominal pain, nausea, vomiting, diarrhea, change in bowel habits, loss of appetite Resp: c/o shortness of breath, DOE.  Minimal cough/sputum production with clear to green mucus.  Skin: no rash or lesions GU: no dysuria, change in color of urine, no urgency or frequency.  No flank pain. MS:  No joint pain or swelling.  No decreased range of motion.  No back pain. Psych:  No change in mood or affect. No depression or anxiety.  No memory loss.   PHYICAL EXAM: General:No overt distress Neuro: No focal deficits CV: IR,IR, no M PULM: hyperresonant to perc, mildly diminished BS, no wheezes or rales GI: NABS, soft, NT Extremities: 1+ symmetric pretibial edema   Blood pressure 90/56, pulse 64, temperature 98.2 F (36.8 C), temperature source Oral, resp. rate 19, SpO2 100.00%.   Lab Results  Component Value Date   WBC 4.7 07/14/2011   HGB 10.9* 07/14/2011   HCT 33.5* 07/14/2011   MCV 102.4* 07/14/2011   PLT 120* 07/14/2011  ,  Lab Results  Component Value Date   CREATININE 1.33 07/14/2011   BUN 20 07/14/2011   NA 137 07/14/2011   K 4.2 07/14/2011   CL 102 07/14/2011   CO2 25 07/14/2011  ,  Lab Results  Component Value Date   ALT  33 07/14/2011   AST 27 07/14/2011   ALKPHOS 84 07/14/2011   BILITOT 0.8 07/14/2011  ,  Lab Results  Component Value Date   CKTOTAL 51 12/14/2008   CKMB 4.7* 12/14/2008  TROPONINI  Value: 0.28   12/14/2008   Dg Chest Port 1 View  07/14/2011    PORTABLE CHEST - 1 VIEW  Comparison: 02/04/2011  Findings: Chronic interstitial markings/emphysematous changes. Increased bilateral lower lobe subpleural reticulation/fibrosis. No superimposed opacities suspicious for pneumonia. No pleural effusion or pneumothorax.  Mild cardiomegaly.  IMPRESSION: Chronic interstitial markings with lower lobe fibrosis.  No superimposed opacities suspicious for pneumonia.  Mild cardiomegaly.  Original Report Authenticated By: Charline Bills, M.D.   . ASSESSMENT / PLAN:  1.  Atrial fibrillation /Aortic stenosis  PLAN: -per Cardiology -heparin gtt  2. COPD, predominantly emphysema. No active bronchospasm.  Suspect increased SOB and DOE in past month is due to AF and worsening CHF superimposed on what is moderate to severe emphysema   PLAN: -xopenex scheduled Q8 & Q4 PRN -Ipratropium q 8hrs -pulmonary hygiene -O2 to keep sats greater than 90%   Canary Brim, NP-C Pgr: [phone]  Pt seen and examined and database reviewed. I agree with above findings, assessment and plan  [name], MD;  PCCM service; [place] [phone]  07/14/2011

## 2011-07-15 ENCOUNTER — Encounter (HOSPITAL_COMMUNITY): Payer: Self-pay | Admitting: Cardiology

## 2011-07-15 DIAGNOSIS — E039 Hypothyroidism, unspecified: Secondary | ICD-10-CM

## 2011-07-15 LAB — POCT I-STAT 3, ART BLOOD GAS (G3+)
Bicarbonate: 27.5 mEq/L — ABNORMAL HIGH (ref 20.0–24.0)
O2 Saturation: 100 %
TCO2: 29 mmol/L (ref 0–100)
pCO2 arterial: 48.5 mmHg — ABNORMAL HIGH (ref 35.0–45.0)
pO2, Arterial: 475 mmHg — ABNORMAL HIGH (ref 80.0–100.0)

## 2011-07-15 LAB — CARDIAC PANEL(CRET KIN+CKTOT+MB+TROPI)
Relative Index: INVALID (ref 0.0–2.5)
Total CK: 57 U/L (ref 7–232)
Total CK: 62 U/L (ref 7–232)

## 2011-07-15 LAB — CBC
HCT: 33.4 % — ABNORMAL LOW (ref 39.0–52.0)
MCV: 103.1 fL — ABNORMAL HIGH (ref 78.0–100.0)
RDW: 16.1 % — ABNORMAL HIGH (ref 11.5–15.5)
WBC: 5.5 10*3/uL (ref 4.0–10.5)

## 2011-07-15 LAB — CREATININE, SERUM: GFR calc Af Amer: 57 mL/min — ABNORMAL LOW (ref >90)

## 2011-07-15 MED ORDER — WARFARIN SODIUM 5 MG PO TABS
5.0000 mg | ORAL_TABLET | Freq: Once | ORAL | Status: AC
  Administered 2011-07-15: 5 mg via ORAL
  Filled 2011-07-15: qty 1

## 2011-07-15 MED ORDER — CLOPIDOGREL BISULFATE 75 MG PO TABS
75.0000 mg | ORAL_TABLET | Freq: Every day | ORAL | Status: DC
  Administered 2011-07-15 – 2011-07-22 (×8): 75 mg via ORAL
  Filled 2011-07-15 (×8): qty 1

## 2011-07-15 MED ORDER — MUPIROCIN 2 % EX OINT
1.0000 "application " | TOPICAL_OINTMENT | Freq: Two times a day (BID) | CUTANEOUS | Status: AC
  Administered 2011-07-15 – 2011-07-19 (×9): 1 via NASAL
  Filled 2011-07-15 (×3): qty 22

## 2011-07-15 MED ORDER — DILTIAZEM HCL 100 MG IV SOLR
5.0000 mg/h | INTRAVENOUS | Status: DC
  Administered 2011-07-15: 5 mg/h via INTRAVENOUS
  Filled 2011-07-15: qty 100

## 2011-07-15 MED ORDER — DIGOXIN 0.25 MG/ML IJ SOLN
0.2500 mg | Freq: Four times a day (QID) | INTRAMUSCULAR | Status: AC
  Administered 2011-07-15 – 2011-07-16 (×2): 0.25 mg via INTRAVENOUS
  Filled 2011-07-15 (×4): qty 1

## 2011-07-15 MED ORDER — SODIUM CHLORIDE 0.9 % IV SOLN
INTRAVENOUS | Status: DC
  Administered 2011-07-15: 10 mL/h via INTRAVENOUS

## 2011-07-15 MED ORDER — DILTIAZEM HCL 100 MG IV SOLR
INTRAVENOUS | Status: AC
  Filled 2011-07-15: qty 100

## 2011-07-15 MED ORDER — NITROGLYCERIN 0.4 MG SL SUBL: 0.4000 mg | SUBLINGUAL_TABLET | SUBLINGUAL | Status: DC | PRN

## 2011-07-15 MED ORDER — ALPRAZOLAM 0.25 MG PO TABS
0.2500 mg | ORAL_TABLET | Freq: Three times a day (TID) | ORAL | Status: DC | PRN
  Administered 2011-07-15 – 2011-07-21 (×3): 0.25 mg via ORAL
  Filled 2011-07-15 (×3): qty 1

## 2011-07-15 MED ORDER — GABAPENTIN 300 MG PO CAPS
300.0000 mg | ORAL_CAPSULE | Freq: Two times a day (BID) | ORAL | Status: DC
  Administered 2011-07-15 – 2011-07-22 (×15): 300 mg via ORAL
  Filled 2011-07-15 (×17): qty 1

## 2011-07-15 MED ORDER — LEVOTHYROXINE SODIUM 25 MCG PO TABS
25.0000 ug | ORAL_TABLET | Freq: Every day | ORAL | Status: DC
  Administered 2011-07-16 – 2011-07-22 (×7): 25 ug via ORAL
  Filled 2011-07-15 (×11): qty 1

## 2011-07-15 MED ORDER — WARFARIN VIDEO
Freq: Once | Status: DC
  Filled 2011-07-15: qty 1

## 2011-07-15 MED ORDER — ALPRAZOLAM 0.25 MG PO TABS
0.2500 mg | ORAL_TABLET | Freq: Every evening | ORAL | Status: DC | PRN
  Administered 2011-07-17 – 2011-07-19 (×3): 0.25 mg via ORAL
  Filled 2011-07-15 (×3): qty 1

## 2011-07-15 MED ORDER — ONDANSETRON HCL 4 MG/2ML IJ SOLN: 4.0000 mg | Freq: Four times a day (QID) | INTRAMUSCULAR | Status: DC | PRN

## 2011-07-15 MED ORDER — CHLORHEXIDINE GLUCONATE CLOTH 2 % EX PADS
6.0000 | MEDICATED_PAD | Freq: Every day | CUTANEOUS | Status: DC
  Administered 2011-07-16 – 2011-07-17 (×2): 6 via TOPICAL

## 2011-07-15 MED ORDER — ALUM & MAG HYDROXIDE-SIMETH 200-200-20 MG/5ML PO SUSP
ORAL | Status: AC
  Administered 2011-07-15: 30 mL
  Filled 2011-07-15: qty 30

## 2011-07-15 MED ORDER — ACETAMINOPHEN 325 MG PO TABS
650.0000 mg | ORAL_TABLET | ORAL | Status: DC | PRN
  Administered 2011-07-18: 650 mg via ORAL
  Filled 2011-07-15 (×2): qty 1

## 2011-07-15 MED ORDER — COUMADIN BOOK
Freq: Once | Status: AC
  Administered 2011-07-15: 18:00:00
  Filled 2011-07-15: qty 1

## 2011-07-15 MED ORDER — CARVEDILOL 3.125 MG PO TABS
3.1250 mg | ORAL_TABLET | Freq: Two times a day (BID) | ORAL | Status: DC
  Administered 2011-07-15 – 2011-07-16 (×4): 3.125 mg via ORAL
  Filled 2011-07-15 (×8): qty 1

## 2011-07-15 MED ORDER — FUROSEMIDE 10 MG/ML IJ SOLN
40.0000 mg | Freq: Two times a day (BID) | INTRAMUSCULAR | Status: DC
  Administered 2011-07-16 – 2011-07-17 (×3): 40 mg via INTRAVENOUS
  Filled 2011-07-15 (×5): qty 4

## 2011-07-15 MED ORDER — IPRATROPIUM BROMIDE 0.02 % IN SOLN
500.0000 ug | Freq: Four times a day (QID) | RESPIRATORY_TRACT | Status: DC | PRN
  Filled 2011-07-15 (×3): qty 2.5

## 2011-07-15 MED ORDER — ISOSORBIDE MONONITRATE ER 60 MG PO TB24
60.0000 mg | ORAL_TABLET | Freq: Every day | ORAL | Status: DC
  Administered 2011-07-15 – 2011-07-22 (×8): 60 mg via ORAL
  Filled 2011-07-15 (×8): qty 1

## 2011-07-15 MED ORDER — ALUM & MAG HYDROXIDE-SIMETH 200-200-20 MG/5ML PO SUSP
30.0000 mL | ORAL | Status: DC | PRN
  Administered 2011-07-16: 30 mL via ORAL
  Filled 2011-07-15: qty 30

## 2011-07-15 MED ORDER — GUAIFENESIN ER 600 MG PO TB12
1200.0000 mg | ORAL_TABLET | Freq: Two times a day (BID) | ORAL | Status: DC
  Administered 2011-07-15 – 2011-07-22 (×15): 1200 mg via ORAL
  Filled 2011-07-15 (×16): qty 2

## 2011-07-15 MED ORDER — NIACIN ER (ANTIHYPERLIPIDEMIC) 500 MG PO TBCR
500.0000 mg | EXTENDED_RELEASE_TABLET | Freq: Every day | ORAL | Status: DC
  Administered 2011-07-15 – 2011-07-21 (×7): 500 mg via ORAL
  Filled 2011-07-15 (×8): qty 1

## 2011-07-15 MED ORDER — SIMVASTATIN 10 MG PO TABS
10.0000 mg | ORAL_TABLET | Freq: Every day | ORAL | Status: DC
  Administered 2011-07-15 – 2011-07-21 (×7): 10 mg via ORAL
  Filled 2011-07-15 (×8): qty 1

## 2011-07-15 MED ORDER — ALBUTEROL 90 MCG/ACT IN AERS
2.0000 | INHALATION_SPRAY | RESPIRATORY_TRACT | Status: DC | PRN
  Filled 2011-07-15: qty 2

## 2011-07-15 NOTE — Progress Notes (Signed)
ANTICOAGULATION CONSULT NOTE - Follow Up Consult  Pharmacy Consult for heparin Indication: atrial fibrillation  No Known Allergies  Patient Measurements: Height: 5\' 10"  (177.8 cm) Weight: 179 lb (81.194 kg) IBW/kg (Calculated) : 73   Vital Signs: Temp: 97.9 F (36.6 C) (11/14 0404) Temp src: Oral (11/14 0404) BP: 102/65 mmHg (11/14 0404) Pulse Rate: 120  (11/14 0404)  Labs:  Basename 07/15/11 0225 07/14/11 1945 07/14/11 1944 07/14/11 1324  HGB -- -- -- 10.9*  HCT -- -- -- 33.5*  PLT -- -- -- 120*  APTT -- 178* -- --  LABPROT -- -- -- 17.1*  INR -- -- -- 1.37  HEPARINUNFRC 0.54 -- -- --  CREATININE 1.31 -- -- 1.33  CKTOTAL 57 -- 54 --  CKMB 2.7 -- 2.7 --  TROPONINI <0.30 -- <0.30 --   Estimated Creatinine Clearance: 45.7 ml/min (by C-G formula based on Cr of 1.31).   Medications:  Scheduled:    . aspirin EC  81 mg Oral Daily  . diltiazem  25 mg Intravenous Once  . diltiazem      . furosemide  40 mg Intravenous Once  . furosemide  40 mg Intravenous Q12H  . heparin  4,000 Units Intravenous Once  . heparin  1,200 Units/hr Intravenous To Major  . ipratropium  0.5 mg Nebulization Q8H  . levalbuterol  0.63 mg Nebulization Q8H  .  morphine injection  2 mg Intravenous Once  . sodium chloride  250 mL Intravenous Once  . DISCONTD: aspirin  81 mg Oral Daily  . DISCONTD: diltiazem  25 mg Intravenous Once   Infusions:    . heparin 12 mL/hr (07/15/11 0600)  . DISCONTD: diltiazem (CARDIZEM) infusion 5 mg/hr (07/14/11 1420)    Assessment: 77yo male therapeutic on heparin with initial dosing for Afib.  Goal of Therapy:  Heparin level 0.3-0.7 units/ml   Plan:  Will continue gtt and confirm stable w/ additional level.  Colleen Can PharmD BCPS 07/15/2011,6:19 AM

## 2011-07-15 NOTE — Progress Notes (Signed)
Pt complaints of  10/10 Chest Pain, BP 73/58, Stat EKG ordered and MD notified - Order to tx to CCU, Cardizem gtt turned off.  Awaiting MD follow up.

## 2011-07-15 NOTE — Progress Notes (Signed)
ANTICOAGULATION CONSULT NOTE - Follow Up Consult  Pharmacy Consult for heparin Indication: atrial fibrillation  No Known Allergies  Patient Measurements: Height: 5\' 10"  (177.8 cm) Weight: 179 lb (81.194 kg) IBW/kg (Calculated) : 73   Vital Signs: Temp: 97.7 F (36.5 C) (11/14 0800) Temp src: Oral (11/14 0800) BP: 83/70 mmHg (11/14 1230) Pulse Rate: 94  (11/14 1230)  Labs:  Basename 07/15/11 1006 07/15/11 0225 07/14/11 1945 07/14/11 1944 07/14/11 1324  HGB 10.8* -- -- -- 10.9*  HCT 33.4* -- -- -- 33.5*  PLT 128* -- -- -- 120*  APTT -- -- 178* -- --  LABPROT -- -- -- -- 17.1*  INR -- -- -- -- 1.37  HEPARINUNFRC -- 0.54 -- -- --  CREATININE -- 1.31 -- -- 1.33  CKTOTAL 62 57 -- 54 --  CKMB 2.8 2.7 -- 2.7 --  TROPONINI <0.30 <0.30 -- <0.30 --   Estimated Creatinine Clearance: 45.7 ml/min (by C-G formula based on Cr of 1.31).   Medications:  Scheduled:     . aspirin EC  81 mg Oral Daily  . carvedilol  3.125 mg Oral BID WC  . Chlorhexidine Gluconate Cloth  6 each Topical Q0600  . clopidogrel  75 mg Oral Daily  . diltiazem  25 mg Intravenous Once  . diltiazem      . furosemide  40 mg Intravenous Once  . furosemide  40 mg Intravenous Q12H  . gabapentin  300 mg Oral BID  . guaiFENesin  1,200 mg Oral BID  . heparin  4,000 Units Intravenous Once  . heparin  1,200 Units/hr Intravenous To Major  . ipratropium  0.5 mg Nebulization Q8H  . isosorbide mononitrate  60 mg Oral Daily  . levalbuterol  0.63 mg Nebulization Q8H  . levothyroxine  25 mcg Oral QAC breakfast  .  morphine injection  2 mg Intravenous Once  . mupirocin  1 application Nasal BID  . niacin  500 mg Oral QHS  . simvastatin  10 mg Oral q1800  . sodium chloride  250 mL Intravenous Once  . DISCONTD: aspirin  81 mg Oral Daily  . DISCONTD: diltiazem      . DISCONTD: diltiazem  25 mg Intravenous Once   Infusions:     . diltiazem (CARDIZEM) infusion 5 mg/hr (07/15/11 0826)  . heparin 1,200 Units/hr  (07/15/11 1117)  . DISCONTD: diltiazem (CARDIZEM) infusion 5 mg/hr (07/14/11 1420)    Assessment: 77 year old male with multiple comorbidities on heparin for atrial fibrillation, now to be bridged to coumadin as well. CBC stable, heparin level 0.54 this morning at a rate of 1200 units/hour.  Goal of Therapy:  Heparin level 0.3-0.7 units/ml   Plan:  Continue heparin 1200 units/hour Coumadin 5 mg po x1 tonight F/u INR/CBC 11/15 Coumadin book and video x1  Tifini Reeder, [place] R PharmD BCPS 07/15/2011,1:28 PM

## 2011-07-15 NOTE — Progress Notes (Signed)
Travis Bond is a 77 y.o. male admitted on 07/14/2011 with A fib and RVR.  Followed by Dr. Craige Bond for COPD/Emphysema, mild pulmonary fibrosis with hx of positive ANA, and chronic hypoxic respiratory failure.  SUBJECTIVE: Cardizem resumed and maintaining blood pressure.  C/O cough with chest congestion, but no wheeze.  Has left sided chest discomfort with cough.  OBJECTIVE:  Blood pressure 113/85, pulse 104, temperature 97.7 F (36.5 C), temperature source Oral, resp. rate 16, height 5\' 10"  (1.778 m), weight 179 lb (81.194 kg), SpO2 97.00%.   Intake/Output Summary (Last 24 hours) at 07/15/11 1133 Last data filed at 07/15/11 0900  Gross per 24 hour  Intake    272 ml  Output   1350 ml  Net  -1078 ml   General - no distress HEENT - no sinus tenderness, no oral lesions Chest - prolonged exhalation, scattered rhonchi, no wheeze Cardiac - irregular Abd - soft, nontender Ext - 1+ ankle edema Neuro - normal strength  Dg Chest Port 1 View  07/14/2011  *RADIOLOGY REPORT*  Clinical Data: Shortness of breath  PORTABLE CHEST - 1 VIEW  Comparison: 02/04/2011  Findings: Chronic interstitial markings/emphysematous changes. Increased bilateral lower lobe subpleural reticulation/fibrosis. No superimposed opacities suspicious for pneumonia. No pleural effusion or pneumothorax.  Mild cardiomegaly.  IMPRESSION: Chronic interstitial markings with lower lobe fibrosis.  No superimposed opacities suspicious for pneumonia.  Mild cardiomegaly.  Original Report Authenticated By: Charline Bills, M.D.   Lab Results  Component Value Date   CREATININE 1.31 07/15/2011   BUN 20 07/14/2011   NA 137 07/14/2011   K 4.2 07/14/2011   CL 102 07/14/2011   CO2 25 07/14/2011   Lab Results  Component Value Date   WBC 5.5 07/15/2011   HGB 10.8* 07/15/2011   HCT 33.4* 07/15/2011   MCV 103.1* 07/15/2011   PLT 128* 07/15/2011     ASSESSMENT/PLAN:  A fib with RVR with Hx of CAD, HTN, hyperlipidemia, PVD -per  Dr. Donnie Bond  Transient hypotension after receiving concurrent lasix and cardizem -resolved  COPD/emphysema -continue current bronchodilator regimen with xopenex due to tachycardia -continue bronchial hygiene  Chronic hypoxic respiratory failure -continue supplemental oxygen to keep SpO2 > 92%  Hx of mild pulmonary fibrosis with positive ANA -don't think this is an active issue  Updated family about plan.  Travis Bond Pager:  [phone] 07/15/2011, 11:33 AM

## 2011-07-15 NOTE — Progress Notes (Signed)
Nursing note- Dr Donnie Aho informed of pt's c/o chest pain and VS.  Remains in afib with HR 100-115.  Will titrate cardizem gtt to control HR.   Pt condition stable, and states that the pain is less when lying on right side.

## 2011-07-15 NOTE — Progress Notes (Signed)
Nursing Note:  MD notified pt HR 110-170 a-fib, new orders obtained and carried out

## 2011-07-15 NOTE — Progress Notes (Signed)
Subjective:  Patient still complains of shortness of breath this morning. He was in rapid atrial fibrillation overnight. He complains of chest pressure it is better if he rolls over onto his right side. All of his enzymes are negative. Appetite is good but he remains edematous.  Objective:  Vital Signs in the last 24 hours: Temp:  [95.1 F (35.1 C)-98.2 F (36.8 C)] 97.7 F (36.5 C) (11/14 0800) Pulse Rate:  [54-151] 130  (11/14 0800) Cardiac Rhythm:  [-] Atrial fibrillation (11/13 2100) Resp:  [15-24] 16  (11/14 0800) BP: (62-130)/(46-90) 104/77 mmHg (11/14 0800) SpO2:  [91 %-100 %] 97 % (11/14 0825) Weight:  [81.194 kg (179 lb)-83.915 kg (185 lb)] 179 lb (81.194 kg) (11/13 2055) 1.  Physical Exam: BP Readings from Last 1 Encounters:  07/15/11 104/77    Wt Readings from Last 1 Encounters:  07/14/11 81.194 kg (179 lb)      Pleasant white male who is in mild respiratory distress currently receiving oxygen treatments. Lungs:  Bilateral wheezing noted, decreased breath sounds  Cardiac:  Rapid irregular rhythm, 2/6 systolic murmur, no S3 Abdomen:  Soft, nontender, no masses Extremities:  2+ edema noted. Chronic venous insufficiency changes seen.  Intake/Output from previous day: 11/13 0701 - 11/14 0700 In: 240 [P.O.:120; I.V.:120] Out: 1150 [Urine:1150]  Lab Results:  BMET    Component Value Date/Time   NA 137 07/14/2011 1324   K 4.2 07/14/2011 1324   CL 102 07/14/2011 1324   CO2 25 07/14/2011 1324   GLUCOSE 104* 07/14/2011 1324   BUN 20 07/14/2011 1324   CREATININE 1.31 07/15/2011 0225   CALCIUM 9.3 07/14/2011 1324   GFRNONAA 49* 07/15/2011 0225   GFRAA 57* 07/15/2011 0225   CBC    Component Value Date/Time   WBC 4.7 07/14/2011 1324   RBC 3.27* 07/14/2011 1324   HGB 10.9* 07/14/2011 1324   HCT 33.5* 07/14/2011 1324   PLT 120* 07/14/2011 1324   MCV 102.4* 07/14/2011 1324   MCH 33.3 07/14/2011 1324   MCHC 32.5 07/14/2011 1324   RDW 16.0* 07/14/2011 1324   LYMPHSABS 1.6 07/14/2011 1324   MONOABS 0.5 07/14/2011 1324   EOSABS 0.2 07/14/2011 1324   BASOSABS 0.0 07/14/2011 1324   CARDIAC ENZYMES Lab Results  Component Value Date   CKTOTAL 57 07/15/2011   CKMB 2.7 07/15/2011   TROPONINI <0.30 07/15/2011    Assessment/Plan:  1. Atrial fibrillation    Assessment: His rate is quite fast this morning. His diltiazem was not restarted last night. He will be given a bolus of 10 mg this morning and placed back on the diltiazem drip. He was somewhat hypotensive in the emergency room yesterday. We will need to watch that carefully. He will need to be on chronic warfarin anticoagulation and we'll go ahead and start that today. 2.  Interstitial pulmonary fibrosis    Assessment: He is being seen by critical care medicine and is currently receiving albuterol treatments. I suspect a lot of his dyspnea however is due to the atrial fibrillation and diastolic dysfunction.  3.  Aortic stenosis  Assessment: He is to have an echocardiogram today. He also has a component to watch her regurgitation but probably because of his lung disease would not be a candidate for valve surgery. 4. Hypothyroidism (07/15/2011)   Assessment: His TSH is elevated at 11 and we'll start low-dose thyroid this morning. To my knowledge this is a new diagnosis  5. Diastolic heart failure-acute on chronic: he has diuresed well overnight  and we'll plan additional Lasix. His weight is down 1 L.   LOS: 1 day    W. Ashley Royalty.  MD [facility] 07/15/2011, 8:48 AM

## 2011-07-15 NOTE — Progress Notes (Addendum)
Patient has chest pain that is positional but negative enzymes.  Efforts to control a fib rate lead to hypotension.  Plan to move to CCU.   Patient was moved to the CCU. On arrival here he appears anxious and states that he is worn out. His heart rate is currently 98-100 and in atrial fibrillation. His blood pressure is 100/70. His respirations were 16 and his lungs showed reduced breath sounds.  He will have an EKG and ABG. Keep off of diltiazem at the present time.

## 2011-07-15 NOTE — Progress Notes (Signed)
UR Completed.   Ryelan Kazee Jane 07/15/2011  

## 2011-07-16 DIAGNOSIS — R0602 Shortness of breath: Secondary | ICD-10-CM

## 2011-07-16 DIAGNOSIS — J438 Other emphysema: Secondary | ICD-10-CM

## 2011-07-16 DIAGNOSIS — I4891 Unspecified atrial fibrillation: Secondary | ICD-10-CM

## 2011-07-16 DIAGNOSIS — I509 Heart failure, unspecified: Secondary | ICD-10-CM

## 2011-07-16 LAB — BASIC METABOLIC PANEL
BUN: 26 mg/dL — ABNORMAL HIGH (ref 6–23)
Calcium: 8.4 mg/dL (ref 8.4–10.5)
Creatinine, Ser: 1.28 mg/dL (ref 0.50–1.35)
GFR calc non Af Amer: 51 mL/min — ABNORMAL LOW (ref >90)
Glucose, Bld: 98 mg/dL (ref 70–99)

## 2011-07-16 LAB — CBC
HCT: 32.1 % — ABNORMAL LOW (ref 39.0–52.0)
Hemoglobin: 10.6 g/dL — ABNORMAL LOW (ref 13.0–17.0)
MCH: 34.2 pg — ABNORMAL HIGH (ref 26.0–34.0)
MCHC: 33 g/dL (ref 30.0–36.0)
MCV: 103.5 fL — ABNORMAL HIGH (ref 78.0–100.0)

## 2011-07-16 MED ORDER — DIGOXIN 250 MCG PO TABS
0.2500 mg | ORAL_TABLET | Freq: Every day | ORAL | Status: DC
  Administered 2011-07-16 – 2011-07-22 (×7): 0.25 mg via ORAL
  Filled 2011-07-16 (×7): qty 1

## 2011-07-16 MED ORDER — WARFARIN SODIUM 5 MG PO TABS
5.0000 mg | ORAL_TABLET | Freq: Once | ORAL | Status: AC
  Administered 2011-07-16: 5 mg via ORAL
  Filled 2011-07-16: qty 1

## 2011-07-16 MED ORDER — DIGOXIN 0.25 MG/ML IJ SOLN
0.2500 mg | Freq: Once | INTRAMUSCULAR | Status: AC
  Administered 2011-07-16: 0.25 mg via INTRAVENOUS
  Filled 2011-07-16: qty 1

## 2011-07-16 MED ORDER — BIOTENE DRY MOUTH MT LIQD
15.0000 mL | Freq: Two times a day (BID) | OROMUCOSAL | Status: DC
  Administered 2011-07-16 – 2011-07-22 (×12): 15 mL via OROMUCOSAL
  Filled 2011-07-16: qty 15

## 2011-07-16 NOTE — Progress Notes (Signed)
Travis Bond is a 77 y.o. male admitted on 07/14/2011 with A fib and RVR.  Followed by Dr. Craige Cotta for COPD/Emphysema, mild pulmonary fibrosis with hx of positive ANA, and chronic hypoxic respiratory failure.  SUBJECTIVE: Required NPPV last night. Off X several hours now and no overt resp distress. C/O general malaise.  OBJECTIVE:  Blood pressure 94/50, pulse 119, temperature 97.7 F (36.5 C), temperature source Oral, resp. rate 15, height 5\' 10"  (1.778 m), weight 77.429 kg (170 lb 11.2 oz), SpO2 96.00%.   Intake/Output Summary (Last 24 hours) at 07/16/11 1041 Last data filed at 07/16/11 0900  Gross per 24 hour  Intake   1274 ml  Output   2920 ml  Net  -1646 ml   General - no distress HEENT - no sinus tenderness, no oral lesions Chest - prolonged exhalation, scattered rhonchi, no wheeze Cardiac - irregular Abd - soft, nontender Ext - 1+ ankle edema Neuro - normal strength  No new CXR  Lab Results  Component Value Date   CREATININE 1.28 07/16/2011   BUN 26* 07/16/2011   NA 136 07/16/2011   K 3.9 07/16/2011   CL 97 07/16/2011   CO2 30 07/16/2011   Lab Results  Component Value Date   WBC 4.6 07/16/2011   HGB 10.6* 07/16/2011   HCT 32.1* 07/16/2011   MCV 103.5* 07/16/2011   PLT 134* 07/16/2011     ASSESSMENT/PLAN:  A fib with RVR with Hx of CAD, HTN, hyperlipidemia, PVD -per Dr. Donnie Aho.   Transient hypotension after receiving concurrent lasix and cardizem -resolved  COPD/emphysema -continue current bronchodilator regimen with xopenex due to tachycardia -continue bronchial hygiene -recheck CXR in AM 11/16  Chronic hypoxic respiratory failure -continue supplemental oxygen to keep SpO2 > 92%  Reported hx of mild pulmonary fibrosis with positive ANA - I don't really see much that I would call pulm fibrosis on his most recent CT chest other than the commonly seen interstitial prominence that one often sees in emphysema. No specific eval or rx indicated. From a  pulmonary perspective there is no reason why he could not be started on Marzetta Merino Mobile:  [phone] 07/16/2011, 10:41 AM

## 2011-07-16 NOTE — Consult Note (Signed)
ANTICOAGULATION CONSULT NOTE - Follow Up Consult  Pharmacy Consult for heparin and coumadin Indication: atrial fibrillation  No Known Allergies  Patient Measurements: Height: 5\' 10"  (177.8 cm) Weight: 170 lb 11.2 oz (77.429 kg) IBW/kg (Calculated) : 73   Vital Signs: Temp: 97.7 F (36.5 C) (11/15 0722) Temp src: Oral (11/15 0722) BP: 94/50 mmHg (11/15 0900) Pulse Rate: 119  (11/15 0800)  Labs:  Basename 07/16/11 0524 07/15/11 1423 07/15/11 1006 07/15/11 0225 07/14/11 1945 07/14/11 1944 07/14/11 1324  HGB 10.6* -- 10.8* -- -- -- --  HCT 32.1* -- 33.4* -- -- -- 33.5*  PLT 134* -- 128* -- -- -- 120*  APTT -- -- -- -- 178* -- --  LABPROT 16.5* -- -- -- -- -- 17.1*  INR 1.31 -- -- -- -- -- 1.37  HEPARINUNFRC 0.55 0.47 -- 0.54 -- -- --  CREATININE 1.28 -- -- 1.31 -- -- 1.33  CKTOTAL -- -- 62 57 -- 54 --  CKMB -- -- 2.8 2.7 -- 2.7 --  TROPONINI -- -- <0.30 <0.30 -- <0.30 --   Estimated Creatinine Clearance: 46.7 ml/min (by C-G formula based on Cr of 1.28).   Medications:  Scheduled:     alum & mag hydroxide-simeth       antiseptic oral rinse  15 mL Mouth Rinse BID   aspirin EC  81 mg Oral Daily   carvedilol  3.125 mg Oral BID WC   Chlorhexidine Gluconate Cloth  6 each Topical Q0600   clopidogrel  75 mg Oral Daily   coumadin book   Does not apply Once   digoxin  0.25 mg Intravenous Q6H   digoxin  0.25 mg Intravenous Once   digoxin  0.25 mg Oral Daily   furosemide  40 mg Intravenous Q12H   gabapentin  300 mg Oral BID   guaiFENesin  1,200 mg Oral BID   ipratropium  0.5 mg Nebulization Q8H   isosorbide mononitrate  60 mg Oral Daily   levalbuterol  0.63 mg Nebulization Q8H   levothyroxine  25 mcg Oral QAC breakfast   mupirocin ointment  1 application Nasal BID   niacin  500 mg Oral QHS   simvastatin  10 mg Oral q1800   warfarin  5 mg Oral ONCE-1800   warfarin   Does not apply Once   DISCONTD: furosemide  40 mg Intravenous Q12H   Infusions:       sodium chloride 10 mL/hr (07/15/11 1430)   diltiazem (CARDIZEM) infusion 5 mg/hr (07/15/11 0826)   heparin 1,200 Units/hr (07/16/11 0749)    Assessment: Mr Krutz is an 83YOM with multiple comorbidities on heparin and coumadin for atrial fibrillation. Heparin level this am therapeutic at 0.55 on 1200 units/hr (12 ml/hr), INR subtherapeutic at 1.31, down slightly from yesterday at 1.37. Patient has only received one 5mg  dose of PO coumadin given last night. Patient also on clopidogrel and ASA. H/H low but stable and platelets ok. No bleeding noted.  Goal of Therapy:  INR 2-3 Heparin level 0.3-0.7 units/ml   Plan:  1. Continue heparin IV at 1200 units/hr (12 ml/hr) 2. Coumadin 5mg  PO x 1 tonight 3. Follow up PT/INR, CBC in AM  Laurence Slate, PharmD Candidate 07/16/2011,11:02 AM

## 2011-07-16 NOTE — Progress Notes (Signed)
Subjective:  He had a much better night. This morning he is calm and is not short of breath and is much less anxious. Marked improvement in symptoms. Still very mild chest discomfort in the left lateral chest wall.  Objective:  Vital Signs in the last 24 hours: Temp:  [97.4 F (36.3 C)-98.1 F (36.7 C)] 97.7 F (36.5 C) (11/15 0722) Pulse Rate:  [36-109] 107  (11/15 0320) Cardiac Rhythm:  [-] Atrial fibrillation (11/14 2000) Resp:  [12-25] 17  (11/15 0500) BP: (73-130)/(54-85) 117/74 mmHg (11/15 0600) SpO2:  [93 %-100 %] 97 % (11/15 0620) FiO2 (%):  [2 %-100 %] 40 % (11/15 0400) Weight:  [77.429 kg (170 lb 11.2 oz)] 170 lb 11.2 oz (77.429 kg) (11/15 0500) 1.  Physical Exam: BP Readings from Last 1 Encounters:  07/16/11 117/74    Wt Readings from Last 1 Encounters:  07/16/11 77.429 kg (170 lb 11.2 oz)      Pleasant white male in no acute distress and not complaining of any pain. Lungs:  Clear bilaterally without wheezing. Decreased breath sounds Cardiac:  Irregular rhythm, 2/6 systolic murmur, no S3 Abdomen:  Soft, nontender, no masses Extremities:  Trace dema noted. Chronic venous insufficiency changes seen.  Intake/Output from previous day: 11/14 0701 - 11/15 0700 In: 748 [P.O.:360; I.V.:376; IV Piggyback:12] Out: 2120 [Urine:2120]  Lab Results:  BMET    Component Value Date/Time   NA 136 07/16/2011 0524   K 3.9 07/16/2011 0524   CL 97 07/16/2011 0524   CO2 30 07/16/2011 0524   GLUCOSE 98 07/16/2011 0524   BUN 26* 07/16/2011 0524   CREATININE 1.28 07/16/2011 0524   CALCIUM 8.4 07/16/2011 0524   GFRNONAA 51* 07/16/2011 0524   GFRAA 59* 07/16/2011 0524   CBC    Component Value Date/Time   WBC 4.6 07/16/2011 0524   RBC 3.10* 07/16/2011 0524   HGB 10.6* 07/16/2011 0524   HCT 32.1* 07/16/2011 0524   PLT 134* 07/16/2011 0524   MCV 103.5* 07/16/2011 0524   MCH 34.2* 07/16/2011 0524   MCHC 33.0 07/16/2011 0524   RDW 16.2* 07/16/2011 0524   LYMPHSABS 1.6  07/14/2011 1324   MONOABS 0.5 07/14/2011 1324   EOSABS 0.2 07/14/2011 1324   BASOSABS 0.0 07/14/2011 1324   CARDIAC ENZYMES   Assessment/Plan:  1. Atrial fibrillation    Assessment: His rate is better controlled this morning but is still slightly above 100. He was begun on Lanoxin yesterday and will be given additional Lanoxin this morning. Warfarin is in the process of being initiated. 2.  Interstitial pulmonary fibrosis    Assessment: He is being seen by critical care medicine . This is thought to be stable and likely the atrial fibrillation and fluid overload was causing a lot of his symptoms.  3.  Aortic stenosis  Assessment: Echocardiogram yesterday showed severe mitral regurgitation and only mild aortic stenosis. LV function was normal.. 4.  Hypothyroidism (07/15/2011)   Assessment: Begun on Synthroid yesterday 5.  Diastolic heart failure-acute on chronic: He continues to diurese well. His weight is down and he is -2 L since yesterday. Continue diuresis.   LOS: 2 days    W. Ashley Royalty.  MD [facility] 07/16/2011, 8:04 AM

## 2011-07-17 ENCOUNTER — Inpatient Hospital Stay (HOSPITAL_COMMUNITY): Payer: Medicare Other

## 2011-07-17 LAB — CBC
HCT: 35.3 % — ABNORMAL LOW (ref 39.0–52.0)
MCH: 34.1 pg — ABNORMAL HIGH (ref 26.0–34.0)
MCV: 103.8 fL — ABNORMAL HIGH (ref 78.0–100.0)
Platelets: 140 10*3/uL — ABNORMAL LOW (ref 150–400)
RBC: 3.4 MIL/uL — ABNORMAL LOW (ref 4.22–5.81)
WBC: 4.6 10*3/uL (ref 4.0–10.5)

## 2011-07-17 LAB — BASIC METABOLIC PANEL
BUN: 22 mg/dL (ref 6–23)
CO2: 32 mEq/L (ref 19–32)
Chloride: 97 mEq/L (ref 96–112)
Glucose, Bld: 108 mg/dL — ABNORMAL HIGH (ref 70–99)
Potassium: 3.5 mEq/L (ref 3.5–5.1)
Sodium: 138 mEq/L (ref 135–145)

## 2011-07-17 MED ORDER — CARVEDILOL 3.125 MG PO TABS
3.1250 mg | ORAL_TABLET | Freq: Two times a day (BID) | ORAL | Status: DC
  Administered 2011-07-17 – 2011-07-22 (×10): 3.125 mg via ORAL
  Filled 2011-07-17 (×13): qty 1

## 2011-07-17 MED ORDER — WARFARIN SODIUM 7.5 MG PO TABS
7.5000 mg | ORAL_TABLET | Freq: Once | ORAL | Status: AC
  Administered 2011-07-17: 7.5 mg via ORAL
  Filled 2011-07-17: qty 1

## 2011-07-17 NOTE — Consult Note (Signed)
Agree with the assessment and plan and will proceed accordingly. Sarabella Caprio, [place] R, PharmD 07/16/2011

## 2011-07-17 NOTE — Progress Notes (Signed)
Travis Bond is a 77 y.o. male admitted on 07/14/2011 with A fib and RVR.  Followed by Dr. Craige Bond for COPD/Emphysema, mild pulmonary fibrosis with hx of positive ANA, and chronic hypoxic respiratory failure.  SUBJECTIVE: Required NPPV last night. Off X several hours now and no overt resp distress. C/O general malaise.  OBJECTIVE:  Blood pressure 101/71, pulse 93, temperature 98.8 F (37.1 C), temperature source Oral, resp. rate 18, height 5\' 10"  (1.778 m), weight 77 kg (169 lb 12.1 oz), SpO2 98.00%.   Intake/Output Summary (Last 24 hours) at 07/17/11 1005 Last data filed at 07/17/11 0900  Gross per 24 hour  Intake   1146 ml  Output   4135 ml  Net  -2989 ml   General - no distress HEENT - no sinus tenderness, no oral lesions Chest - prolonged exhalation, scattered rhonchi, no wheeze Cardiac - irregular Abd - soft, nontender Ext - 1+ ankle edema Neuro - normal strength  No new CXR  Lab Results  Component Value Date   CREATININE 1.16 07/17/2011   BUN 22 07/17/2011   NA 138 07/17/2011   K 3.5 07/17/2011   CL 97 07/17/2011   CO2 32 07/17/2011   Lab Results  Component Value Date   WBC 4.6 07/17/2011   HGB 11.6* 07/17/2011   HCT 35.3* 07/17/2011   MCV 103.8* 07/17/2011   PLT 140* 07/17/2011    CXR: NAD  ASSESSMENT/PLAN:  A fib with RVR with Hx of CAD, HTN, hyperlipidemia, PVD -per Dr. Donnie Bond.   Transient hypotension after receiving concurrent lasix and cardizem -resolved  COPD/emphysema -continue current bronchodilator regimen with xopenex due to tachycardia -continue bronchial hygiene -recheck CXR in AM 11/16  Chronic hypoxic respiratory failure -continue supplemental oxygen to keep SpO2 > 92%  Reported hx of pulmonary fibrosis with positive ANA - minimal pulm fibrosis on his most recent CT chest. Not an active problem OK to use amiodarone if deemed necessary by Dr Travis Bond  His pulm status is well stabilized on nebulized Bdilators. PCCM will see PRN this  WE. Please call if pulm or CCM issues arise. Dr Travis Bond will F/U first of next week to help define what his discharge pulmonary medical regimen should be   Travis Bond Mobile:  [phone] 07/17/2011, 10:05 AM

## 2011-07-17 NOTE — Progress Notes (Signed)
SUBJECTIVE:  The patient is doing well today.  He denies any SOB and his only complaint is shoulder pain from laying in bed.  OBJECTIVE:   Vitals:   Filed Vitals:   07/17/11 0600 07/17/11 0700 07/17/11 0727 07/17/11 0800  BP: 103/71 91/48  101/71  Pulse:      Temp:   98.8 F (37.1 C)   TempSrc:   Oral   Resp: 15 18    Height:      Weight:      SpO2: 96% 98%     I&O's:   Intake/Output Summary (Last 24 hours) at 07/17/11 0945 Last data filed at 07/17/11 0900  Gross per 24 hour  Intake   1168 ml  Output   4135 ml  Net  -2967 ml   TELEMETRY: Reviewed telemetry pt in atrial fibrillation with heart rate 100-114bpm    PHYSICAL EXAM General: Well developed, well nourished, in no acute distress Head: Eyes PERRLA, No xanthomas.   Normal cephalic and atramatic  Lungs:   Decreased breath sounds throughout Heart:   Irregularly irregular S1 S2 Pulses are 2+ & equal.            No carotid bruit. No JVD.  No abdominal bruits. No femoral bruits. Abdomen: Bowel sounds are positive, abdomen soft and non-tender without masses  Extremities:   No clubbing, cyanosis or edema.  DP +1 Neuro: Alert and oriented X 3. Psych:  Good affect, responds appropriately   LABS: Basic Metabolic Panel:  Basename 07/17/11 0523 07/16/11 0524  NA 138 136  K 3.5 3.9  CL 97 97  CO2 32 30  GLUCOSE 108* 98  BUN 22 26*  CREATININE 1.16 1.28  CALCIUM 9.1 8.4  MG -- --  PHOS -- --   Liver Function Tests:  [facility] 07/14/11 1324  AST 27  ALT 33  ALKPHOS 84  BILITOT 0.8  PROT 6.8  ALBUMIN 3.6    CBC:  Basename 07/17/11 0523 07/16/11 0524 07/14/11 1324  WBC 4.6 4.6 --  NEUTROABS -- -- 2.4  HGB 11.6* 10.6* --  HCT 35.3* 32.1* --  MCV 103.8* 103.5* --  PLT 140* 134* --   Cardiac Enzymes:  Basename 07/15/11 1006 07/15/11 0225 07/14/11 1944  CKTOTAL 62 57 54  CKMB 2.8 2.7 2.7  CKMBINDEX -- -- --  TROPONINI <0.30 <0.30 <0.30   BNP:  Basename 07/17/11 0523 07/14/11 1421  POCBNP 4967.0*  5019.0*     Thyroid Function Tests:  Basename 07/14/11 1945  TSH 11.784*  T4TOTAL --  T3FREE --  THYROIDAB --   Anemia Panel: No results found for this basename: VITAMINB12,FOLATE,FERRITIN,TIBC,IRON,RETICCTPCT in the last 72 hours Coag Panel:   Lab Results  Component Value Date   INR 1.35 07/17/2011   INR 1.31 07/16/2011   INR 1.37 07/14/2011    RADIOLOGY: Mr Lumbar Spine Wo Contrast  07/03/2011  *RADIOLOGY REPORT*  Clinical Data: Low back pain and bilateral lower extremity weakness.  MRI LUMBAR SPINE WITHOUT CONTRAST  Technique:  Multiplanar and multiecho pulse sequences of the lumbar spine were obtained without intravenous contrast.  Comparison: None.  Findings: Vertebral body height and alignment are maintained. There is reactive endplate signal change eccentric to the left at L2-3 and to the right at L4-5.  The conus medullaris is normal in signal and position.  The patient has a descending abdominal aortic aneurysm measuring 4.4 x 4.3 cm.  T2 hyperintensities in the right kidney are most consistent with cysts.  The T11-12 and  T12-L1 levels are imaged in the sagittal plane only. There is some disc bulging at each level but the central canal and foramina appear open.  L1-2:  Mild disc bulge without central canal or foraminal narrowing.  L2-3:  There is facet degenerative disease and a broad-based disc bulge with endplate spurring.  The disc is eccentrically prominent to the left and causes moderate central canal narrowing.  There is also narrowing of the lateral recesses, worse on the left. Extraforaminal disc on the left contacts and posteriorly deflects the exited left L2 root.  L3-4:  There is a disc bulge, ligamentum flavum thickening and facet arthropathy.  Mild to moderate central canal narrowing is present.  Disc contacts the exiting right L3 root without displacing it.  L4-5:  The patient has a diffuse disc bulge eccentrically prominent in the right paravertebral space with endplate  spurring.  There is mild central canal narrowing.  Narrowing of the right lateral recess is noted with some encroachment on the descending right L5 root.  Foramina appear open.  L5-S1:  There is disc bulge with endplate spurring.  The central canal is open.  Foramina are mildly narrowed.  IMPRESSION:  1.  Multilevel degenerative disease appears most notable at L2-3 where there is moderate central canal narrowing.  Broad-based disc bulge eccentric to the left contacts and posteriorly deflects the exited left L2 root. 2.  Disc bulge causes narrowing of the right lateral recess at L4- 5.  3.  Disc just contacts the exiting right L3 root without compressing or displacing it. 4.  4.4 cm descending abdominal aortic aneurysm. 5.  Negative for fracture or other acute finding.  Original Report Authenticated By: Bernadene Bell. Maricela Curet, M.D.   Dg Chest Port 1 View  07/17/2011  *RADIOLOGY REPORT*  Clinical Data: Respiratory failure.  PORTABLE CHEST - 1 VIEW  Comparison: 07/14/2011  Findings: There has been no change in the appearance of the lungs since the prior study.  Chronic interstitial and obstructive lung disease is noted with chronic prominence of the main pulmonary arteries consistent with pulmonary arterial hypertension.  No new infiltrates.  IMPRESSION: Stable appearance of the lungs since the prior study.  Original Report Authenticated By: Gwynn Burly, M.D.   Dg Chest Port 1 View  07/14/2011  *RADIOLOGY REPORT*  Clinical Data: Shortness of breath  PORTABLE CHEST - 1 VIEW  Comparison: 02/04/2011  Findings: Chronic interstitial markings/emphysematous changes. Increased bilateral lower lobe subpleural reticulation/fibrosis. No superimposed opacities suspicious for pneumonia. No pleural effusion or pneumothorax.  Mild cardiomegaly.  IMPRESSION: Chronic interstitial markings with lower lobe fibrosis.  No superimposed opacities suspicious for pneumonia.  Mild cardiomegaly.  Original Report Authenticated By:  Charline Bills, M.D.      ASSESSMENT:  1.  Atrial fibrillation with mildly elevated heart rate.  Digoxin started yesterday.  Also on Coreg but cannot increase dose any further due to borderline hypotension. 2.  Systemic anticoagulation loading coumadin. 3.  Interstitial pulmonary fibrosis followed by Pulmonary and felt to be stable 4.  Acute on chronic diastolic heart failure with good diuresis.  Since admit he is negative 5L.   5.  Hypothyroidism now on synthroid 6.  Mild aortic stenosis with severe MR by echo PLAN:   1.  Hold Lasix for now due to increased diuresis with borderline  hypotenstion 2.  BMET and BNP in am 3.  Continue Coreg as BP tolerates and digoxin for rate control 4.  Continue coumadin load  Otie Headlee R  07/17/2011  9:45  AM

## 2011-07-17 NOTE — Consult Note (Signed)
PHARMACY - CRITICAL CARE PROGRESS NOTE  Pharmacy Consult for heparin and coumadin Indication: atrial fibrillation  Travis Bond is a 83YOM admitted on 11/13 with atrial fibrillation and HR in the 200s, with complaints of increasing fatigue, dyspnea, edema, and 10 lb weight gain over previous 2 weeks.  No Known Allergies  Patient Measurements: Height: 5\' 10"  (177.8 cm) Weight: 169 lb 12.1 oz (77 kg) IBW/kg (Calculated) : 73   Vital Signs: Temp: 98.8 F (37.1 C) (11/16 0727) Temp src: Oral (11/16 0727) BP: 101/71 mmHg (11/16 0800) Intake/Output from previous day: 11/15 0701 - 11/16 0700 In: 1180 [P.O.:640; I.V.:540] Out: 5135 [Urine:5135] Intake/Output from this shift: Total I/O In: 284 [P.O.:240; I.V.:44] Out: -   Labs:  Basename 07/17/11 0523 07/16/11 0524 07/15/11 1006 07/15/11 0225 07/14/11 1945 07/14/11 1324  WBC 4.6 4.6 5.5 -- -- --  HGB 11.6* 10.6* 10.8* -- -- --  HCT 35.3* 32.1* 33.4* -- -- --  PLT 140* 134* 128* -- -- --  APTT -- -- -- -- 178* --  INR 1.35 1.31 -- -- -- 1.37  CREATININE 1.16 1.28 -- 1.31 -- --  LABCREA -- -- -- -- -- --  CREATININE 1.16 1.28 -- 1.31 -- --  LABCREA -- -- -- -- -- --  [id number] -- -- -- -- -- --  MG -- -- -- -- -- --  PHOS -- -- -- -- -- --  ALBUMIN -- -- -- -- -- 3.6  PROT -- -- -- -- -- 6.8  AST -- -- -- -- -- 27  ALT -- -- -- -- -- 33  ALKPHOS -- -- -- -- -- 84  BILITOT -- -- -- -- -- 0.8  BILIDIR -- -- -- -- -- --  IBILI -- -- -- -- -- --   Estimated Creatinine Clearance: 51.6 ml/min (by C-G formula based on Cr of 1.16).   Medications:  Scheduled:    . antiseptic oral rinse  15 mL Mouth Rinse BID  . aspirin EC  81 mg Oral Daily  . carvedilol  3.125 mg Oral BID WC  . Chlorhexidine Gluconate Cloth  6 each Topical Q0600  . clopidogrel  75 mg Oral Daily  . digoxin  0.25 mg Oral Daily  . furosemide  40 mg Intravenous Q12H  . gabapentin  300 mg Oral BID  . guaiFENesin  1,200 mg Oral BID  . ipratropium  0.5 mg  Nebulization Q8H  . isosorbide mononitrate  60 mg Oral Daily  . levalbuterol  0.63 mg Nebulization Q8H  . levothyroxine  25 mcg Oral QAC breakfast  . mupirocin ointment  1 application Nasal BID  . niacin  500 mg Oral QHS  . simvastatin  10 mg Oral q1800  . warfarin  5 mg Oral ONCE-1800  . warfarin   Does not apply Once   Infusions:    . sodium chloride 10 mL/hr (07/15/11 1430)  . diltiazem (CARDIZEM) infusion 5 mg/hr (07/15/11 0826)  . heparin 12 mL/hr (07/17/11 0900)    Patient Active Problem List  Diagnoses  . C O P D  . Interstitial pulmonary fibrosis  . Chronic respiratory failure with hypoxia  . CAD (coronary artery disease) s/p CABG in 1984 and DES in 2001  . Atrial fibrillation  . Hypertensive heart disease without CHF  . Peripheral neuropathy  . Hyperlipidemia  . Peripheral vascular disease  . Mitral regurgitation  . Aortic stenosis  . Drug eluting stent   . Hypothyroidism   Pharmacy problem list: Atrial  fibrillation: Heparin level therapeutic this am at 0.50 on 1200 units/hr (12 ml/hr). INR subtherapeutic at 1.35 after receiving coumadin 5mg  PO x 2 doses, will increase dose today. Patient also on clopidogrel and ASA. Patient received digoxin load of 1mg  total on 11/14 and 11/15, then started on 0.25 mg PO daily. High daily dose with respect to advanced age and hypothyroidism. Consider digoxin level next week. H/H and platelets low, but uptrending. Renal function improving.  Acute on chronic diastolic heart failure: EF 50-55% Patient has been diuresed with furosemide and is down ~15lbs since admission. Furosemide has been d/c'd. Electrolytes WNL except K+ at 3.5. Patient currently on carvedilol.   Borderline hypotension: BPs 87-110/58-73 over the past 24 hours with pulse rate 90s-low 100s. Patient on low-dose carvedilol at 3.125mg  PO BID. Furosemide has been d/c'd to stop diuresis.  Renal dysfunction: SCr 1.33 on admission (est CrCl 48 ml/min by C-G) decreased to 1.16  (est CrCl 57 ml/min).  Hypothyroidism: TSH elevated at 11.784 on 11/13 and patient subsequently started on levothyroxine PO 11/14.   Goal of Therapy:  Heparin level 0.3-0.7 INR 2-3  Plan:  1. Continue heparin IV at 1200 units/hr (12 ml/hr) 2. Coumadin 7.5mg  PO x 1 tonight 3. Follow up PT/INR, heparin level, CBC, electrolytes, SCr in AM 4. Monitor for s/sx digoxin toxicity   Travis Bond 07/17/2011,9:49 AM  I have discussed the patient with Verdon Cummins and agree with the assessment and plan. Robbin Escher, [place] R 07/17/2011 2:28 PM

## 2011-07-18 LAB — BASIC METABOLIC PANEL
CO2: 29 mEq/L (ref 19–32)
Chloride: 99 mEq/L (ref 96–112)
Creatinine, Ser: 0.93 mg/dL (ref 0.50–1.35)
GFR calc Af Amer: 89 mL/min — ABNORMAL LOW (ref >90)
Potassium: 4.1 mEq/L (ref 3.5–5.1)
Sodium: 138 mEq/L (ref 135–145)

## 2011-07-18 LAB — CBC
HCT: 36.7 % — ABNORMAL LOW (ref 39.0–52.0)
Hemoglobin: 12 g/dL — ABNORMAL LOW (ref 13.0–17.0)
MCHC: 32.7 g/dL (ref 30.0–36.0)
RBC: 3.54 MIL/uL — ABNORMAL LOW (ref 4.22–5.81)
WBC: 4.7 10*3/uL (ref 4.0–10.5)

## 2011-07-18 LAB — PROTIME-INR
INR: 1.92 — ABNORMAL HIGH (ref 0.00–1.49)
Prothrombin Time: 22.3 seconds — ABNORMAL HIGH (ref 11.6–15.2)

## 2011-07-18 LAB — HEPARIN LEVEL (UNFRACTIONATED): Heparin Unfractionated: 0.42 IU/mL (ref 0.30–0.70)

## 2011-07-18 LAB — PRO B NATRIURETIC PEPTIDE: Pro B Natriuretic peptide (BNP): 3687 pg/mL — ABNORMAL HIGH (ref 0–450)

## 2011-07-18 MED ORDER — CHLORHEXIDINE GLUCONATE CLOTH 2 % EX PADS
6.0000 | MEDICATED_PAD | Freq: Every day | CUTANEOUS | Status: AC
  Administered 2011-07-18 – 2011-07-20 (×3): 6 via TOPICAL

## 2011-07-18 MED ORDER — WARFARIN SODIUM 3 MG PO TABS
3.0000 mg | ORAL_TABLET | Freq: Once | ORAL | Status: AC
  Administered 2011-07-18: 3 mg via ORAL
  Filled 2011-07-18: qty 1

## 2011-07-18 NOTE — Progress Notes (Signed)
ANTICOAGULATION CONSULT NOTE - Follow Up Consult  Pharmacy Consult for heparin and coumadin Indication: Afib  No Known Allergies  Patient Measurements: Height: 5\' 10"  (177.8 cm) Weight: 167 lb 8.8 oz (76 kg) IBW/kg (Calculated) : 73  Adjusted Body Weight:   Vital Signs: Temp: 96.5 F (35.8 C) (11/17 0800) Temp src: Oral (11/17 0800) BP: 106/75 mmHg (11/17 0900)  Labs:  Basename 07/18/11 0500 07/17/11 0523 07/16/11 0524 07/15/11 1006  HGB 12.0* 11.6* -- --  HCT 36.7* 35.3* 32.1* --  PLT 163 140* 134* --  APTT -- -- -- --  LABPROT 22.3* 16.9* 16.5* --  INR 1.92* 1.35 1.31 --  HEPARINUNFRC 0.42 0.50 0.55 --  CREATININE 0.93 1.16 1.28 --  CKTOTAL -- -- -- 62  CKMB -- -- -- 2.8  TROPONINI -- -- -- <0.30   Estimated Creatinine Clearance: 64.3 ml/min (by C-G formula based on Cr of 0.93).    Assessment: Patient is an 77 y.o M on anticoagulation for afib.  Heparin level is therapeutic at 0.42.  INR is slightly below goal range but increased sharply from 1.35 to 1.92 today.    Goal of Therapy:  Heparin level 0.3-0.7; INR 2-3   Plan:  1) cont heparin gtt at 1200 units/hr 2) coumadin 3mg  PO x1 today  Viliami Bracco P 07/18/2011,10:00 AM

## 2011-07-18 NOTE — Progress Notes (Signed)
SUBJECTIVE:  He is feeling better today.  He denies any SOB.  His heart rate is under better control.  OBJECTIVE:   Vitals:   Filed Vitals:   07/18/11 0400 07/18/11 0500 07/18/11 0600 07/18/11 0700  BP: 95/43 95/43 81/50  90/64  Pulse:      Temp: 97.5 F (36.4 C)     TempSrc: Oral     Resp: 13 13 13 19   Height:      Weight: 76 kg (167 lb 8.8 oz) 76 kg (167 lb 8.8 oz)    SpO2: 98% 98% 97% 98%   I&O's:   Intake/Output Summary (Last 24 hours) at 07/18/11 1610 Last data filed at 07/18/11 0600  Gross per 24 hour  Intake    818 ml  Output   1376 ml  Net   -558 ml   TELEMETRY: Reviewed telemetry pt in afib with controlled VR:   PHYSICAL EXAM General: Well developed, well nourished, in no acute distress Head: Eyes PERRLA, No xanthomas.   Normal cephalic and atramatic  Lungs:   Decreased breath sounds at bases with a few crackles. Heart:   Irregularly irregular S1 S2 Pulses are 2+ & equal.            No carotid bruit. No JVD.  No abdominal bruits. No femoral bruits. Abdomen: Bowel sounds are positive, abdomen soft and non-tender without masses                 Extremities:   No clubbing, cyanosis or edema.  DP +1 Neuro: Alert and oriented X 3. Psych:  Good affect, responds appropriately   LABS: Basic Metabolic Panel:  Basename 07/17/11 0523 07/16/11 0524  NA 138 136  K 3.5 3.9  CL 97 97  CO2 32 30  GLUCOSE 108* 98  BUN 22 26*  CREATININE 1.16 1.28  CALCIUM 9.1 8.4  MG -- --  PHOS -- --   CBC:  Basename 07/18/11 0500 07/17/11 0523  WBC 4.7 4.6  NEUTROABS -- --  HGB 12.0* 11.6*  HCT 36.7* 35.3*  MCV 103.7* 103.8*  PLT 163 140*   Cardiac Enzymes:  Basename 07/15/11 1006  CKTOTAL 62  CKMB 2.8  CKMBINDEX --  TROPONINI <0.30   BNP:  Basename 07/18/11 0018 07/17/11 0523  POCBNP 3687.0* 4967.0*    Coag Panel:   Lab Results  Component Value Date   INR 1.35 07/17/2011   INR 1.31 07/16/2011   INR 1.37 07/14/2011    RADIOLOGY: Mr Lumbar Spine Wo  Contrast  07/03/2011  *RADIOLOGY REPORT*  Clinical Data: Low back pain and bilateral lower extremity weakness.  MRI LUMBAR SPINE WITHOUT CONTRAST  Technique:  Multiplanar and multiecho pulse sequences of the lumbar spine were obtained without intravenous contrast.  Comparison: None.  Findings: Vertebral body height and alignment are maintained. There is reactive endplate signal change eccentric to the left at L2-3 and to the right at L4-5.  The conus medullaris is normal in signal and position.  The patient has a descending abdominal aortic aneurysm measuring 4.4 x 4.3 cm.  T2 hyperintensities in the right kidney are most consistent with cysts.  The T11-12 and T12-L1 levels are imaged in the sagittal plane only. There is some disc bulging at each level but the central canal and foramina appear open.  L1-2:  Mild disc bulge without central canal or foraminal narrowing.  L2-3:  There is facet degenerative disease and a broad-based disc bulge with endplate spurring.  The disc is eccentrically prominent  to the left and causes moderate central canal narrowing.  There is also narrowing of the lateral recesses, worse on the left. Extraforaminal disc on the left contacts and posteriorly deflects the exited left L2 root.  L3-4:  There is a disc bulge, ligamentum flavum thickening and facet arthropathy.  Mild to moderate central canal narrowing is present.  Disc contacts the exiting right L3 root without displacing it.  L4-5:  The patient has a diffuse disc bulge eccentrically prominent in the right paravertebral space with endplate spurring.  There is mild central canal narrowing.  Narrowing of the right lateral recess is noted with some encroachment on the descending right L5 root.  Foramina appear open.  L5-S1:  There is disc bulge with endplate spurring.  The central canal is open.  Foramina are mildly narrowed.  IMPRESSION:  1.  Multilevel degenerative disease appears most notable at L2-3 where there is moderate central  canal narrowing.  Broad-based disc bulge eccentric to the left contacts and posteriorly deflects the exited left L2 root. 2.  Disc bulge causes narrowing of the right lateral recess at L4- 5.  3.  Disc just contacts the exiting right L3 root without compressing or displacing it. 4.  4.4 cm descending abdominal aortic aneurysm. 5.  Negative for fracture or other acute finding.  Original Report Authenticated By: Bernadene Bell. Maricela Curet, M.D.   Dg Chest Port 1 View  07/17/2011  *RADIOLOGY REPORT*  Clinical Data: Respiratory failure.  PORTABLE CHEST - 1 VIEW  Comparison: 07/14/2011  Findings: There has been no change in the appearance of the lungs since the prior study.  Chronic interstitial and obstructive lung disease is noted with chronic prominence of the main pulmonary arteries consistent with pulmonary arterial hypertension.  No new infiltrates.  IMPRESSION: Stable appearance of the lungs since the prior study.  Original Report Authenticated By: Gwynn Burly, M.D.   Dg Chest Port 1 View  07/14/2011  *RADIOLOGY REPORT*  Clinical Data: Shortness of breath  PORTABLE CHEST - 1 VIEW  Comparison: 02/04/2011  Findings: Chronic interstitial markings/emphysematous changes. Increased bilateral lower lobe subpleural reticulation/fibrosis. No superimposed opacities suspicious for pneumonia. No pleural effusion or pneumothorax.  Mild cardiomegaly.  IMPRESSION: Chronic interstitial markings with lower lobe fibrosis.  No superimposed opacities suspicious for pneumonia.  Mild cardiomegaly.  Original Report Authenticated By: Charline Bills, M.D.      ASSESSMENT:  1.  Atrial fibrillation no with controlled ventricular respsonse 2.  Hypotension improved after holding Lasix and stopping Cardizem 3.  Chronic hypoxic respiratory failure 4.  Pulmonary fibrosis 5.  Systemic anticoagulation on IV Heparin while loading coumadin 6.  Acute on chronic diastolic HF currently Chest xray with no edema.  Lasix on hold due to  low BP 7.  Hypothyroidism now on synthroid 8.  Mild AS with severe MR on echo   PLAN:   1.  Continue coreg and dig for rate control 2.  Countinue coumadin load 3.  Will hold Lasix again today  TURNER,TRACI R  07/18/2011  8:32 AM

## 2011-07-18 NOTE — Progress Notes (Signed)
Travis Bond is a 77 y.o. male admitted on 07/14/2011 with A fib and RVR.  Followed by Dr. Craige Cotta for COPD/Emphysema, mild pulmonary fibrosis with hx of positive ANA, and chronic hypoxic respiratory failure.  SUBJECTIVE: [name] BIPAP used over night  OBJECTIVE:  Blood pressure 90/64, pulse 85, temperature 96.5 F (35.8 C), temperature source Oral, resp. rate 19, height 5\' 10"  (1.778 m), weight 76 kg (167 lb 8.8 oz), SpO2 98.00%.   Intake/Output Summary (Last 24 hours) at 07/18/11 0845 Last data filed at 07/18/11 0600  Gross per 24 hour  Intake    818 ml  Output   1376 ml  Net   -558 ml   General - no distress HEENT - no sinus tenderness, no oral lesions Chest - scattered ronchi Cardiac - irregular Abd - soft, nontender Ext - 1+ ankle edema Neuro - normal strength  No new CXR  Lab Results  Component Value Date   CREATININE 0.93 07/18/2011   BUN 16 07/18/2011   NA 138 07/18/2011   K 4.1 07/18/2011   CL 99 07/18/2011   CO2 29 07/18/2011   Lab Results  Component Value Date   WBC 4.7 07/18/2011   HGB 12.0* 07/18/2011   HCT 36.7* 07/18/2011   MCV 103.7* 07/18/2011   PLT 163 07/18/2011     ASSESSMENT/PLAN:  A fib with RVR with Hx of CAD, HTN, hyperlipidemia, PVD -per Dr. Donnie Aho.  Controlled currently well, dig  Transient hypotension after receiving concurrent lasix and cardizem -resolved, tele , monitor  COPD/emphysema -continue current bronchodilator regimen with xopenex  / atrovent -continue bronchial hygiene -, reviewed CXR in AM 11/16, some bibasilr infiltrates/ edema / atx  Currently  No distress, did great last pm without NIMV No NIMv further needed Continue to control rvr and diuresis per cards Low O2 support No empiric steroids rewuired Tolerating coreg well  Chronic hypoxic respiratory failure -continue supplemental oxygen to keep SpO2 > 92%  Will return Monday, call if needed over weekend further   Nelda Bucks Mobile:   [phone] 07/18/2011, 8:45 AM

## 2011-07-19 LAB — PROTIME-INR
INR: 2.2 — ABNORMAL HIGH (ref 0.00–1.49)
Prothrombin Time: 24.8 seconds — ABNORMAL HIGH (ref 11.6–15.2)

## 2011-07-19 LAB — CBC
HCT: 36.3 % — ABNORMAL LOW (ref 39.0–52.0)
Hemoglobin: 11.9 g/dL — ABNORMAL LOW (ref 13.0–17.0)
MCHC: 32.8 g/dL (ref 30.0–36.0)
WBC: 4.1 10*3/uL (ref 4.0–10.5)

## 2011-07-19 LAB — PRO B NATRIURETIC PEPTIDE: Pro B Natriuretic peptide (BNP): 2885 pg/mL — ABNORMAL HIGH (ref 0–450)

## 2011-07-19 LAB — HEPARIN LEVEL (UNFRACTIONATED): Heparin Unfractionated: 0.3 IU/mL (ref 0.30–0.70)

## 2011-07-19 MED ORDER — WARFARIN SODIUM 3 MG PO TABS
3.0000 mg | ORAL_TABLET | Freq: Once | ORAL | Status: AC
  Administered 2011-07-19: 3 mg via ORAL
  Filled 2011-07-19: qty 1

## 2011-07-19 NOTE — Progress Notes (Signed)
ANTICOAGULATION CONSULT NOTE - Follow Up Consult  Pharmacy Consult for coumadin Indication: atrial fibrillation  No Known Allergies  Patient Measurements: Height: 5\' 10"  (177.8 cm) Weight: 169 lb 5 oz (76.8 kg) IBW/kg (Calculated) : 73  Adjusted Body Weight:   Vital Signs: Temp: 96 F (35.6 C) (11/18 0800) Temp src: Oral (11/18 0800) BP: 93/57 mmHg (11/18 0800) Pulse Rate: 86  (11/18 0933)  Labs:  Basename 07/19/11 0500 07/18/11 0500 07/17/11 0523  HGB 11.9* 12.0* --  HCT 36.3* 36.7* 35.3*  PLT 156 163 140*  APTT -- -- --  LABPROT 24.8* 22.3* 16.9*  INR 2.20* 1.92* 1.35  HEPARINUNFRC 0.30 0.42 0.50  CREATININE -- 0.93 1.16  CKTOTAL -- -- --  CKMB -- -- --  TROPONINI -- -- --   Estimated Creatinine Clearance: 64.3 ml/min (by C-G formula based on Cr of 0.93).    Assessment: Patient is an 77 y.o M on anticoagulation for Afib.  INR is therapeutic at 2.20 with heparin d/ced this AM.  Goal of Therapy:  INR 2-3   Plan:  1) Repeat coumadin 3mg  po x1 today  [place] Kievit P 07/19/2011,9:37 AM

## 2011-07-19 NOTE — Progress Notes (Signed)
SUBJECTIVE:  Overall doing well.  He thinks he may be slightly more SOB today than yesterday. OBJECTIVE:   Vitals:   Filed Vitals:   07/19/11 0533 07/19/11 0600 07/19/11 0757 07/19/11 0800  BP: 95/52 94/55 93/57  93/57  Pulse: 71     Temp:    96 F (35.6 C)  TempSrc:    Oral  Resp: 15 15  20   Height:      Weight:      SpO2: 97% 97%  97%   I&O's:   Intake/Output Summary (Last 24 hours) at 07/19/11 0845 Last data filed at 07/19/11 0600  Gross per 24 hour  Intake   1020 ml  Output   1950 ml  Net   -930 ml   TELEMETRY: Reviewed telemetry pt in atrial fibrillation   PHYSICAL EXAM General: Well developed, well nourished, in no acute distress Head: Eyes PERRLA, No xanthomas.   Normal cephalic and atramatic  Lungs:   Decreased BS throughout with a few scattered crackles Heart:   Irregularly irregular HRRR S1 S2 Pulses are 2+ & equal.            No carotid bruit. No JVD.  No abdominal bruits. No femoral bruits. Abdomen: Bowel sounds are positive, abdomen soft and non-tender without masses  Extremities:   No clubbing, cyanosis or edema.  DP +1 Neuro: Alert and oriented X 3. Psych:  Good affect, responds appropriately   LABS: Basic Metabolic Panel:  Basename 07/18/11 0500 07/17/11 0523  NA 138 138  K 4.1 3.5  CL 99 97  CO2 29 32  GLUCOSE 102* 108*  BUN 16 22  CREATININE 0.93 1.16  CALCIUM 8.9 9.1  MG -- --  PHOS -- --    Basename 07/19/11 0500 07/18/11 0500  WBC 4.1 4.7  NEUTROABS -- --  HGB 11.9* 12.0*  HCT 36.3* 36.7*  MCV 104.0* 103.7*  PLT 156 163   BNP:  Basename 07/18/11 0018 07/17/11 0523  POCBNP 3687.0* 4967.0*    Coag Panel:   Lab Results  Component Value Date   INR 2.20* 07/19/2011   INR 1.92* 07/18/2011   INR 1.35 07/17/2011    RADIOLOGY: Mr Lumbar Spine Wo Contrast  07/03/2011  *RADIOLOGY REPORT*  Clinical Data: Low back pain and bilateral lower extremity weakness.  MRI LUMBAR SPINE WITHOUT CONTRAST  Technique:  Multiplanar and multiecho  pulse sequences of the lumbar spine were obtained without intravenous contrast.  Comparison: None.  Findings: Vertebral body height and alignment are maintained. There is reactive endplate signal change eccentric to the left at L2-3 and to the right at L4-5.  The conus medullaris is normal in signal and position.  The patient has a descending abdominal aortic aneurysm measuring 4.4 x 4.3 cm.  T2 hyperintensities in the right kidney are most consistent with cysts.  The T11-12 and T12-L1 levels are imaged in the sagittal plane only. There is some disc bulging at each level but the central canal and foramina appear open.  L1-2:  Mild disc bulge without central canal or foraminal narrowing.  L2-3:  There is facet degenerative disease and a broad-based disc bulge with endplate spurring.  The disc is eccentrically prominent to the left and causes moderate central canal narrowing.  There is also narrowing of the lateral recesses, worse on the left. Extraforaminal disc on the left contacts and posteriorly deflects the exited left L2 root.  L3-4:  There is a disc bulge, ligamentum flavum thickening and facet arthropathy.  Mild to  moderate central canal narrowing is present.  Disc contacts the exiting right L3 root without displacing it.  L4-5:  The patient has a diffuse disc bulge eccentrically prominent in the right paravertebral space with endplate spurring.  There is mild central canal narrowing.  Narrowing of the right lateral recess is noted with some encroachment on the descending right L5 root.  Foramina appear open.  L5-S1:  There is disc bulge with endplate spurring.  The central canal is open.  Foramina are mildly narrowed.  IMPRESSION:  1.  Multilevel degenerative disease appears most notable at L2-3 where there is moderate central canal narrowing.  Broad-based disc bulge eccentric to the left contacts and posteriorly deflects the exited left L2 root. 2.  Disc bulge causes narrowing of the right lateral recess at  L4- 5.  3.  Disc just contacts the exiting right L3 root without compressing or displacing it. 4.  4.4 cm descending abdominal aortic aneurysm. 5.  Negative for fracture or other acute finding.  Original Report Authenticated By: Bernadene Bell. Maricela Curet, M.D.   Dg Chest Port 1 View  07/17/2011  *RADIOLOGY REPORT*  Clinical Data: Respiratory failure.  PORTABLE CHEST - 1 VIEW  Comparison: 07/14/2011  Findings: There has been no change in the appearance of the lungs since the prior study.  Chronic interstitial and obstructive lung disease is noted with chronic prominence of the main pulmonary arteries consistent with pulmonary arterial hypertension.  No new infiltrates.  IMPRESSION: Stable appearance of the lungs since the prior study.  Original Report Authenticated By: Gwynn Burly, M.D.   Dg Chest Port 1 View  07/14/2011  *RADIOLOGY REPORT*  Clinical Data: Shortness of breath  PORTABLE CHEST - 1 VIEW  Comparison: 02/04/2011  Findings: Chronic interstitial markings/emphysematous changes. Increased bilateral lower lobe subpleural reticulation/fibrosis. No superimposed opacities suspicious for pneumonia. No pleural effusion or pneumothorax.  Mild cardiomegaly.  IMPRESSION: Chronic interstitial markings with lower lobe fibrosis.  No superimposed opacities suspicious for pneumonia.  Mild cardiomegaly.  Original Report Authenticated By: Charline Bills, M.D.      ASSESSMENT:  1.  Atrial fibrillation rate controlled on dig and coreg 2.  Systemic anticoagulation with therapeutic INR 3.  Chronic hypoxic respiratory failure 4.  Pulmonary fibrosis 5.  Acute on chronic diastolic HF 6.  hypothyroidism now on synthroid 7.  Mild AS with severe MR 8.  Hypotension improved after stopping Cardizem and holding Lasix  PLAN:   1.  Continue coreg and dig for rate control 2.  D/C IV Heparin gtt 3.  BMET and BNP in am 4.  Will continue to hold Lasix for now since his BP is running low and his lung exam is not very  impressive for CHF.  His O2 sats are good on O2 and he is negative  7L since admit  TURNER,TRACI R  07/19/2011  8:45 AM

## 2011-07-20 DIAGNOSIS — I4891 Unspecified atrial fibrillation: Secondary | ICD-10-CM

## 2011-07-20 DIAGNOSIS — J438 Other emphysema: Secondary | ICD-10-CM

## 2011-07-20 DIAGNOSIS — R0602 Shortness of breath: Secondary | ICD-10-CM

## 2011-07-20 LAB — BASIC METABOLIC PANEL
BUN: 18 mg/dL (ref 6–23)
Creatinine, Ser: 0.96 mg/dL (ref 0.50–1.35)
GFR calc Af Amer: 88 mL/min — ABNORMAL LOW (ref >90)
GFR calc non Af Amer: 76 mL/min — ABNORMAL LOW (ref >90)
Potassium: 4.7 mEq/L (ref 3.5–5.1)

## 2011-07-20 LAB — CBC
MCH: 33.2 pg (ref 26.0–34.0)
MCHC: 32.2 g/dL (ref 30.0–36.0)
Platelets: 167 10*3/uL (ref 150–400)
RBC: 3.64 MIL/uL — ABNORMAL LOW (ref 4.22–5.81)

## 2011-07-20 LAB — PROTIME-INR
INR: 2.05 — ABNORMAL HIGH (ref 0.00–1.49)
Prothrombin Time: 23.5 seconds — ABNORMAL HIGH (ref 11.6–15.2)

## 2011-07-20 MED ORDER — WARFARIN SODIUM 5 MG PO TABS
5.0000 mg | ORAL_TABLET | Freq: Once | ORAL | Status: AC
  Administered 2011-07-20: 5 mg via ORAL
  Filled 2011-07-20 (×2): qty 1

## 2011-07-20 NOTE — Progress Notes (Signed)
SUBJECTIVE:  Much improved over the weekend. He is currently not short of breath and is not having any other complaints. He is diuresed about 5 L since admission. His atrial fibrillation rate is much better controlled.  OBJECTIVE:   Vitals:   Filed Vitals:   07/20/11 0500 07/20/11 0600 07/20/11 0700 07/20/11 0742  BP: 94/45 107/49 100/53 103/49  Pulse:      Temp:    97.9 F (36.6 C)  TempSrc:    Oral  Resp: 16 16 16 20   Height:      Weight:      SpO2: 99% 96% 97% 99%   I&O's:    Intake/Output Summary (Last 24 hours) at 07/20/11 0759 Last data filed at 07/20/11 0400  Gross per 24 hour  Intake   1284 ml  Output   1425 ml  Net   -141 ml   TELEMETRY: Reviewed telemetry pt in atrial fibrillation with controlled response  PHYSICAL EXAM General: Well developed, well nourished, in no acute distress Lungs:   Reduced breath sounds but clear bilaterally. Heart:   Irregularly irregular HRRR S1 S2 no edema is present. 2/6 systolic murmur. Abdomen: Bowel sounds are positive, abdomen soft and non-tender without masses  Extremities:   No edema  LABS: Basic Metabolic Panel:  Basename 07/20/11 0530 07/18/11 0500  NA 136 138  K 4.7 4.1  CL 99 99  CO2 26 29  GLUCOSE 82 102*  BUN 18 16  CREATININE 0.96 0.93  CALCIUM 9.6 8.9  MG -- --  PHOS -- --    Basename 07/20/11 0530 07/19/11 0500  WBC 4.1 4.1  NEUTROABS -- --  HGB 12.1* 11.9*  HCT 37.6* 36.3*  MCV 103.3* 104.0*  PLT 167 156   BNP:  Basename 07/19/11 1513 07/18/11 0018  POCBNP 2885.0* 3687.0*    Coag Panel:   Lab Results  Component Value Date   INR 2.05* 07/20/2011   INR 2.20* 07/19/2011   INR 1.92* 07/18/2011   ASSESSMENT:  1.  Atrial fibrillation rate controlled on dig and coreg 2.  Systemic anticoagulation with therapeutic INR 3.  Chronic hypoxic respiratory failure 4.  Pulmonary fibrosis 5.  Acute on chronic diastolic HF-resolved  6.  hypothyroidism now on synthroid 7.  Mild AS with severe MR 8.   Hypotension improved after stopping Cardizem and holding Lasix  PLAN:   1.  Continue coreg and dig for rate control 2.  transfer to floor and hopefully discharge in the next couple of days. 3.  BMET  in am 4.  restart Lasix low dose to prevent further fluid accumulation.   Darden Palmer, MD  07/20/2011  7:59 AM

## 2011-07-20 NOTE — Progress Notes (Signed)
Travis Bond is a 77 y.o. male admitted on 07/14/2011 with A fib and RVR.  Followed by Dr. Craige Cotta for COPD/Emphysema, mild pulmonary fibrosis with hx of positive ANA, and chronic hypoxic respiratory failure.  SUBJECTIVE: No cp/ dyspnea  OBJECTIVE:  Blood pressure 102/63, pulse 94, temperature 97.9 F (36.6 C), temperature source Oral, resp. rate 16, height 5\' 10"  (1.778 m), weight 76.5 kg (168 lb 10.4 oz), SpO2 94.00%.   Intake/Output Summary (Last 24 hours) at 07/20/11 1027 Last data filed at 07/20/11 0900  Gross per 24 hour  Intake   1260 ml  Output   1975 ml  Net   -715 ml   General - no distress HEENT - no sinus tenderness, no oral lesions Chest - scattered ronchi Cardiac - irregular Abd - soft, nontender Ext - 1+ ankle edema Neuro - normal strength  No new CXR  Lab Results  Component Value Date   CREATININE 0.96 07/20/2011   BUN 18 07/20/2011   NA 136 07/20/2011   K 4.7 07/20/2011   CL 99 07/20/2011   CO2 26 07/20/2011   Lab Results  Component Value Date   WBC 4.1 07/20/2011   HGB 12.1* 07/20/2011   HCT 37.6* 07/20/2011   MCV 103.3* 07/20/2011   PLT 167 07/20/2011     ASSESSMENT/PLAN:  A fib with RVR with Hx of CAD, HTN, hyperlipidemia, PVD -per Dr. Donnie Aho.  Controlled currently well, dig  Transient hypotension after receiving concurrent lasix and cardizem -resolved, tele , monitor  COPD/emphysema -continue current bronchodilator regimen with xopenex  / atrovent -continue bronchial hygiene -, reviewed CXR in AM 11/16, some bibasilr infiltrates/ edema / atx No NIMv further needed Low O2 support No empiric steroids needed Tolerating coreg well  Chronic hypoxic respiratory failure -continue supplemental oxygen to keep SpO2 > 92%  PCCM to sign off, Out pt FU with dr Craige Cotta Ok tot ransfer to t ele   ALVA,RAKESH V.  07/20/2011, 10:27 AM

## 2011-07-20 NOTE — Progress Notes (Signed)
ANTICOAGULATION CONSULT NOTE - Follow Up Consult  Pharmacy Consult for Coumadin Indication: atrial fibrillation  No Known Allergies  Patient Measurements: Height: 5\' 10"  (177.8 cm) Weight: 168 lb 10.4 oz (76.5 kg) IBW/kg (Calculated) : 73   Vital Signs: Temp: 97.6 F (36.4 C) (11/19 1136) Temp src: Oral (11/19 1136) BP: 106/64 mmHg (11/19 1136) Pulse Rate: 97  (11/19 1136)  Labs:  Basename 07/20/11 0530 07/19/11 0500 07/18/11 0500  HGB 12.1* 11.9* --  HCT 37.6* 36.3* 36.7*  PLT 167 156 163  APTT -- -- --  LABPROT 23.5* 24.8* 22.3*  INR 2.05* 2.20* 1.92*  HEPARINUNFRC -- 0.30 0.42  CREATININE 0.96 -- 0.93  CKTOTAL -- -- --  CKMB -- -- --  TROPONINI -- -- --   Estimated Creatinine Clearance: 62.3 ml/min (by C-G formula based on Cr of 0.96).   Medications:  Scheduled:    . antiseptic oral rinse  15 mL Mouth Rinse BID  . aspirin EC  81 mg Oral Daily  . carvedilol  3.125 mg Oral BID WC  . Chlorhexidine Gluconate Cloth  6 each Topical Q0600  . clopidogrel  75 mg Oral Daily  . digoxin  0.25 mg Oral Daily  . gabapentin  300 mg Oral BID  . guaiFENesin  1,200 mg Oral BID  . ipratropium  0.5 mg Nebulization Q8H  . isosorbide mononitrate  60 mg Oral Daily  . levalbuterol  0.63 mg Nebulization Q8H  . levothyroxine  25 mcg Oral QAC breakfast  . mupirocin ointment  1 application Nasal BID  . niacin  500 mg Oral QHS  . simvastatin  10 mg Oral q1800  . warfarin  3 mg Oral ONCE-1800  . warfarin   Does not apply Once   Infusions:    Assessment: Mr. Tat is an 77 year old gentleman with a complex medical history relevant for new onset atrial fibrillation with rapid ventricular response. Doing well clinically today. Day #6 coumadin therapy, INR 2.05<---2.2. CBC stable. Goal of Therapy:  INR 2-3   Plan:  Coumadin 5 mg po x1 tonight INR 11/20 goal 2-3  Shateka Petrea, [place] R 07/20/2011,11:55 AM

## 2011-07-21 LAB — CBC
HCT: 37.7 % — ABNORMAL LOW (ref 39.0–52.0)
Hemoglobin: 12.3 g/dL — ABNORMAL LOW (ref 13.0–17.0)
MCV: 103.6 fL — ABNORMAL HIGH (ref 78.0–100.0)
Platelets: 166 10*3/uL (ref 150–400)
RBC: 3.64 MIL/uL — ABNORMAL LOW (ref 4.22–5.81)
WBC: 4.5 10*3/uL (ref 4.0–10.5)

## 2011-07-21 LAB — CREATININE, SERUM: Creatinine, Ser: 0.96 mg/dL (ref 0.50–1.35)

## 2011-07-21 MED ORDER — FUROSEMIDE 40 MG PO TABS
40.0000 mg | ORAL_TABLET | Freq: Every day | ORAL | Status: DC
  Administered 2011-07-21 – 2011-07-22 (×2): 40 mg via ORAL
  Filled 2011-07-21 (×2): qty 1

## 2011-07-21 MED ORDER — WARFARIN SODIUM 7.5 MG PO TABS
7.5000 mg | ORAL_TABLET | Freq: Once | ORAL | Status: AC
  Administered 2011-07-21: 7.5 mg via ORAL
  Filled 2011-07-21: qty 1

## 2011-07-21 NOTE — Progress Notes (Signed)
Utilization review completed. Yocheved Depner, RN, BSN. 07/21/11 

## 2011-07-21 NOTE — Progress Notes (Signed)
SUBJECTIVE:  He is now on telemetry. He remains in atrial fibrillation with controlled response. His breathing is much better and he denies significant shortness of breath at rest. His edema has now resolved. He has no significant chest pain.  OBJECTIVE:   Vitals:   Filed Vitals:   07/20/11 2042 07/20/11 2116 07/21/11 0614 07/21/11 0755  BP:  124/75 127/74 108/64  Pulse:  83 76 60  Temp:  98.3 F (36.8 C) 97.4 F (36.3 C)   TempSrc:  Oral Oral   Resp:  16 14   Height:      Weight:      SpO2: 93% 99% 96%    I&O's:    Intake/Output Summary (Last 24 hours) at 07/21/11 0454 Last data filed at 07/21/11 0800  Gross per 24 hour  Intake    840 ml  Output   1450 ml  Net   -610 ml   TELEMETRY: Reviewed telemetry pt in atrial fibrillation with controlled response  PHYSICAL EXAM General: Well developed, well nourished, in no acute distress Lungs:   Reduced breath sounds but clear bilaterally. Heart:   Irregularly irregular HRRR S1 S2 no edema is present. 2/6 systolic murmur. Abdomen: Bowel sounds are positive, abdomen soft and non-tender without masses  Extremities:   No edema  LABS: Basic Metabolic Panel:  Basename 07/20/11 0530  NA 136  K 4.7  CL 99  CO2 26  GLUCOSE 82  BUN 18  CREATININE 0.96  CALCIUM 9.6  MG --  PHOS --    Basename 07/21/11 0642 07/20/11 0530  WBC 4.5 4.1  NEUTROABS -- --  HGB 12.3* 12.1*  HCT 37.7* 37.6*  MCV 103.6* 103.3*  PLT 166 167   BNP:  Basename 07/19/11 1513  POCBNP 2885.0*    Coag Panel:   Lab Results  Component Value Date   INR 1.94* 07/21/2011   INR 2.05* 07/20/2011   INR 2.20* 07/19/2011   ASSESSMENT:  1.  Atrial fibrillation rate controlled on dig and coreg 2.  Systemic anticoagulation with therapeutic INR 3.  Chronic hypoxic respiratory failure 4.  Pulmonary fibrosis 5.  Acute on chronic diastolic HF-resolved  6.  hypothyroidism now on synthroid 7.  Mild AS with severe MR 8.  Hypotension improved after stopping  Cardizem and holding Lasix  PLAN:   1.  Continue coreg and dig for rate control 2.  arrange for home oxygen. If he remains stable overnight we will plan to discharge him in the morning.  3.  BMET  in am 4. Extra warfarin because INR is subtherapeutic.   Darden Palmer, MD  07/21/2011  8:38 AM

## 2011-07-21 NOTE — Progress Notes (Signed)
ANTICOAGULATION CONSULT NOTE - Follow Up Consult  Pharmacy Consult for Coumadin Indication: atrial fibrillation  No Known Allergies  Patient Measurements: Height: 5\' 10"  (177.8 cm) Weight: 168 lb 10.4 oz (76.5 kg) IBW/kg (Calculated) : 73   Vital Signs: Temp: 97.3 F (36.3 C) (11/20 1047) Temp src: Oral (11/20 1047) BP: 137/83 mmHg (11/20 1047) Pulse Rate: 80  (11/20 1047)  Labs:  Basename 07/21/11 0642 07/20/11 0530 07/19/11 0500  HGB 12.3* 12.1* --  HCT 37.7* 37.6* 36.3*  PLT 166 167 156  APTT -- -- --  LABPROT 22.5* 23.5* 24.8*  INR 1.94* 2.05* 2.20*  HEPARINUNFRC -- -- 0.30  CREATININE 0.96 0.96 --  CKTOTAL -- -- --  CKMB -- -- --  TROPONINI -- -- --   Estimated Creatinine Clearance: 62.3 ml/min (by C-G formula based on Cr of 0.96).   Medications:  Scheduled:     . antiseptic oral rinse  15 mL Mouth Rinse BID  . aspirin EC  81 mg Oral Daily  . carvedilol  3.125 mg Oral BID WC  . clopidogrel  75 mg Oral Daily  . digoxin  0.25 mg Oral Daily  . gabapentin  300 mg Oral BID  . guaiFENesin  1,200 mg Oral BID  . ipratropium  0.5 mg Nebulization Q8H  . isosorbide mononitrate  60 mg Oral Daily  . levalbuterol  0.63 mg Nebulization Q8H  . levothyroxine  25 mcg Oral QAC breakfast  . niacin  500 mg Oral QHS  . simvastatin  10 mg Oral q1800  . warfarin  5 mg Oral ONCE-1800  . warfarin   Does not apply Once   Infusions:    Assessment: 77 yo M on Coumadin for new onset atrial fibrillation with rapid ventricular response. INR has trended down on reduced Coumadin doses.  Will increase dose tonight.  CBC stable.  Goal of Therapy:  INR 2-3   Plan:  Coumadin 7.5mg  tonight. INR in AM.  Travis Bond, Judie Bonus 07/21/2011,1:17 PM

## 2011-07-21 NOTE — Progress Notes (Signed)
Ambulated pt without O2, desat to 84%. Stated he is on 3 LPM/[place] at home continuously. The only thing he says would be helpful would to have a portable home O2 tank instead of just a concentrator. Sticky note left for MD.

## 2011-07-21 NOTE — Progress Notes (Signed)
Pt ambulated 250 feet in hallway with 3 L O2 and standby assist x1 at 2130. Gait steady. Pt said his back felt weak. Overall, pt tolerated walk well. Will continue to monitor.  Alfonso Ellis, RN 07/21/2011 11:27 PM

## 2011-07-22 LAB — CBC
Hemoglobin: 12.9 g/dL — ABNORMAL LOW (ref 13.0–17.0)
MCV: 102.6 fL — ABNORMAL HIGH (ref 78.0–100.0)
Platelets: 171 10*3/uL (ref 150–400)
RBC: 3.79 MIL/uL — ABNORMAL LOW (ref 4.22–5.81)
WBC: 5 10*3/uL (ref 4.0–10.5)

## 2011-07-22 LAB — BASIC METABOLIC PANEL
Chloride: 98 mEq/L (ref 96–112)
GFR calc Af Amer: 77 mL/min — ABNORMAL LOW (ref >90)
GFR calc non Af Amer: 67 mL/min — ABNORMAL LOW (ref >90)
Glucose, Bld: 102 mg/dL — ABNORMAL HIGH (ref 70–99)
Potassium: 4.3 mEq/L (ref 3.5–5.1)
Sodium: 136 mEq/L (ref 135–145)

## 2011-07-22 MED ORDER — WARFARIN SODIUM 4 MG PO TABS: 4.0000 mg | ORAL_TABLET | Freq: Every day | ORAL | Status: DC

## 2011-07-22 MED ORDER — IPRATROPIUM BROMIDE 0.02 % IN SOLN
0.5000 mg | Freq: Three times a day (TID) | RESPIRATORY_TRACT | Status: DC
  Administered 2011-07-22: 0.5 mg via RESPIRATORY_TRACT
  Filled 2011-07-22: qty 2.5

## 2011-07-22 MED ORDER — FUROSEMIDE 40 MG PO TABS: 40.0000 mg | ORAL_TABLET | Freq: Every day | ORAL | Status: DC

## 2011-07-22 MED ORDER — LEVALBUTEROL HCL 0.63 MG/3ML IN NEBU
0.6300 mg | INHALATION_SOLUTION | Freq: Three times a day (TID) | RESPIRATORY_TRACT | Status: DC
  Administered 2011-07-22: 0.63 mg via RESPIRATORY_TRACT
  Filled 2011-07-22 (×3): qty 3

## 2011-07-22 MED ORDER — LEVOTHYROXINE SODIUM 25 MCG PO TABS: 25.0000 ug | ORAL_TABLET | Freq: Every day | ORAL | Status: DC

## 2011-07-22 MED ORDER — DIGOXIN 250 MCG PO TABS: 0.2500 mg | ORAL_TABLET | Freq: Every day | ORAL | Status: DC

## 2011-07-22 NOTE — Progress Notes (Signed)
Went over all d/c orders with patient and he understood, all follow up care and RX diet etc. Meds /s/s to call MD, education sheets chf/meds

## 2011-07-22 NOTE — Progress Notes (Signed)
Pt is active with [facility] for home 02. Per Christoper Allegra pt has 02 3l at night and with activity and 2l at rest. CM spoke to representative and they will contact pt about concerns with tank. Pt wanted a smaller tank. Pt has small portable tank that is over the shoulder.  Rep stated that he has the smallest tank. CM received order for hh RN and will set pt up with [facility]. SOC to begin-within 24-48 hrs post d/c. Gala Lewandowsky, [place] 07-22-11 [phone]

## 2011-07-22 NOTE — Discharge Summary (Signed)
Physician Discharge Summary  Patient ID: Travis Bond MRN: [id number] DOB/AGE: 10/22/1926 77 y.o.  Admit date: 07/14/2011 Discharge date: 07/22/2011  Primary Discharge Diagnosis  Atrial fibrillation with rapid response with accompanying congestive heart failure  Secondary Discharge Diagnosis 1. Rapid atrial fibrillation-resolved 2. Acute diastolic heart failure due 2 rapid atrial fibrillation 3. Severe mitral regurgitation 4. Mild-to-moderate aortic stenosis 5. Coronary artery disease with previous stent placement to the LAD 6. Severe interstitial lung disease with hypoxemia 7. Hypertensive heart disease 8. Hyperlipidemia 9. Peripheral neuropathy 10.  Hypothyroidism new diagnosis  Significant Diagnostic Studies: Echocardiogram  Consults: Critical care medicine  Hospital Course:  77 year old male who has a history of COPD and interstitial pulmonary fibrosis with chronic oxygen therapy. He had a drug-eluting stent placed to the LAD 2 years ago and has an occluded right coronary artery. He has moderate aortic stenosis and moderate to severe mitral regurgitation. He is felt poorly over the month prior to admission with increasing malaise and fatigue as well as increasing dyspnea and presented to the emergency room in rapid atrial fibrillation with a 10 pound weight gain after he was sent over from the pulmonary office. He was started on a diltiazem drip in the emergency room but became hypotensive in the drip was stopped.  The patient was admitted to the step down unit but later was transferred to the intensive care unit because of hypotension on diltiazem.He was seen by the pulmonary physicians who continued his pulmonary treatments. He was edematous and his BNP was elevated so he was diuresed.  He also complained of chest pain when he would roll over onto his side. All of his cardiac enzymes remained normal. He became hypotensive on the diltiazem I stopped that and loaded him with  Lanoxin and after that he gradually improved.  With this his heart rate was under much better control and he was diuresed. He had a diuresis of 5 kg. His renal function remained stable and his breathing was much better. BNP level fell from 4967 to 2885.  An echocardiogram showed moderate to severe mitral regurgitation as well as mild to moderate aortic stenosis. He was not felt to be a good candidate for surgery. He was gradually transitioned to oral diuretics and was able to be discharged home. He will need to be on home oxygen on a perpetual basis and had an oxygen saturation of 84%  with exercise. His aspirin was stopped but he will be continued on Plavix. He was initiated on treatment with warfarin and achieved a therapeutic INR. He was also found to be hypothyroid and was initiated under treatment with Synthroid. He improved and was able to be discharged home. He will have home health nursing come by and also will have additional look at his home oxygen requirements.   Discharge Exam: Blood pressure 123/75, pulse 81, temperature 97.2 F (36.2 C), temperature source Oral, resp. rate 16, height 5\' 10"  (1.778 m), weight 80.9 kg (178 lb 5.6 oz), SpO2 98.00%.     He was alert and oriented, his lungs showed reduced breath sounds at the bases. He had a 2-3/6 systolic murmur. There was no edema.  Labs: CMP     Component Value Date/Time   NA 136 07/22/2011 0545   K 4.3 07/22/2011 0545   CL 98 07/22/2011 0545   CO2 30 07/22/2011 0545   GLUCOSE 102* 07/22/2011 0545   BUN 19 07/22/2011 0545   CREATININE 1.02 07/22/2011 0545   CALCIUM 9.7 07/22/2011 0545   PROT  6.8 07/14/2011 1324   ALBUMIN 3.6 07/14/2011 1324   AST 27 07/14/2011 1324   ALT 33 07/14/2011 1324   ALKPHOS 84 07/14/2011 1324   BILITOT 0.8 07/14/2011 1324   GFRNONAA 67* 07/22/2011 0545   GFRAA 77* 07/22/2011 0545    Lab Results  Component Value Date   WBC 5.0 07/22/2011   HGB 12.9* 07/22/2011   HCT 38.9* 07/22/2011   MCV  102.6* 07/22/2011   PLT 171 07/22/2011    Lab 07/22/11 0545  NA 136  K 4.3  CL 98  CO2 30  BUN 19  CREATININE 1.02  CALCIUM 9.7  PROT --  BILITOT --  ALKPHOS --  ALT --  AST --  GLUCOSE 102*   Lab Results  Component Value Date   CKTOTAL 62 07/15/2011   CKMB 2.8 07/15/2011   TROPONINI <0.30 07/15/2011    FOLLOW UP PLANS AND APPOINTMENTS:  He is to go home to the care of his daughter and and will have a home health RN combine. He is to weigh daily and should use furosemide 40 mg daily. He was given warfarin instruction and is to see [place] on Monday for a protime check and I will see him again in one week.  Current Discharge Medication List    START taking these medications   Details  digoxin (LANOXIN) 0.25 MG tablet Take 1 tablet (0.25 mg total) by mouth daily. Qty: 30 tablet, Refills: 12    furosemide (LASIX) 40 MG tablet Take 1 tablet (40 mg total) by mouth daily. Qty: 30 tablet, Refills: 12    levothyroxine (SYNTHROID, LEVOTHROID) 25 MCG tablet Take 1 tablet (25 mcg total) by mouth daily before breakfast. Qty: 30 tablet, Refills: 12    warfarin (COUMADIN) 4 MG tablet Take 1 tablet (4 mg total) by mouth daily with supper. Qty: 30 tablet, Refills: 12      CONTINUE these medications which have NOT CHANGED   Details  albuterol (PROVENTIL) (2.5 MG/3ML) 0.083% nebulizer solution Take 2.5 mg by nebulization every 4 (four) hours as needed. Shortness of breath    albuterol (PROVENTIL,VENTOLIN) 90 MCG/ACT inhaler Inhale 2 puffs into the lungs as needed. Shortness of breath.     ALPRAZolam (XANAX) 0.25 MG tablet Take 0.25 mg by mouth at bedtime as needed. sleep    carvedilol (COREG) 3.125 MG tablet Take 3.125 mg by mouth.     clopidogrel (PLAVIX) 75 MG tablet Take 75 mg by mouth daily.      gabapentin (NEURONTIN) 300 MG capsule Take 300 mg by mouth 2 (two) times daily. 2 by mouth daily     guaiFENesin (MUCINEX) 600 MG 12 hr tablet Take 1,200 mg by mouth 2 (two) times  daily.      ipratropium (ATROVENT) 0.02 % nebulizer solution Take 500 mcg by nebulization 4 (four) times daily as needed.     isosorbide mononitrate (IMDUR) 60 MG 24 hr tablet Take 60 mg by mouth daily.      niacin (NIASPAN) 500 MG CR tablet Take 500 mg by mouth at bedtime.      pravastatin (PRAVACHOL) 20 MG tablet Take 20 mg by mouth. 1 BY MOUTH AT BEDTIME     nitroGLYCERIN (NITROSTAT) 0.4 MG SL tablet Place 0.4 mg under the tongue as needed. Chest pain      STOP taking these medications     aspirin 81 MG EC tablet        Follow-up Information    Follow up with TILLEY JR,W  SPENCER, MD. Make an appointment on 07/27/2011. (to have protime checked)    Follow up with TILLEY JR,W SPENCER, MD. Make an appointment in 1 week. (to see doctor)        Signed:  Darden Palmer. MD [employer]  07/22/2011, 2:45 PM

## 2011-09-16 ENCOUNTER — Encounter (HOSPITAL_COMMUNITY): Payer: Self-pay | Admitting: Pharmacy Technician

## 2011-09-17 ENCOUNTER — Other Ambulatory Visit: Payer: Self-pay | Admitting: Cardiology

## 2011-09-17 ENCOUNTER — Ambulatory Visit
Admission: RE | Admit: 2011-09-17 | Discharge: 2011-09-17 | Disposition: A | Discharge status: Home / Self Care | Payer: Medicare Other | Source: Ambulatory Visit | Attending: Cardiology | Admitting: Cardiology

## 2011-09-17 ENCOUNTER — Encounter: Payer: Self-pay | Admitting: Cardiology

## 2011-09-17 DIAGNOSIS — I4891 Unspecified atrial fibrillation: Secondary | ICD-10-CM

## 2011-09-17 NOTE — H&P (Signed)
Travis Bond    Date of visit:  09/17/2011 DOB:  [birth year]    Age:  78 yrs. Medical record number:  [id number]     Account number:  [id number] Primary Care Provider: Maxwell Caul ____________________________ CURRENT DIAGNOSES  1. Arrhythmia-Atrial Fibrillation  2. Congestive Heart Failure Chronic Diastolic  3. Long-term (current) use of anticoagulants  4. Idiopathic Fibrosing Alveolitis  5. Hyperlipidemia  6. CAD,Native  7. Unspecified Idiopathic Peripheral Neuropathy  8. Carotid Artery Stenosis  9. Stent Placement  10. Peripheral Vascular Disease  11. Aortic Valve Disorder  12. Hypothyroidism  13. COPD ____________________________ ALLERGIES  NKDA ____________________________ MEDICATIONS  1. Nitroglycerin 0.4 mg Tablet, Sublingual, use prn  2. Proventil HFA 90 mcg/Actuation HFA Aerosol Inhaler, PRN  3. albuterol sulfate 2.5 mg/0.5 mL Solution for Nebulization, QID  4. Mucinex 600 mg Tablet Sustained Release, BID  5. ipratropium bromide 0.06 % Spray, Non-Aerosol, PRN  6. gabapentin 300 mg Capsule, BID  7. carvedilol 3.125 mg Tablet, BID  8. pravastatin 20 mg Tablet, QHS  9. Niaspan Extended-Release 500 mg Tablet Extended Release 24 hr, 1 p.o. daily  10. Plavix 75 mg Tablet, 1 p.o. daily  11. isosorbide mononitrate 60 mg tablet extended release 24 hr, 1 p.o. daily  12. warfarin 4 mg tablet, 1 p.o. daily  13. digoxin 250 mcg tablet, 1 p.o. daily  14. Synthroid 25 mcg tablet, 1 p.o. daily  15. furosemide 40 mg tablet, 1 p.o. daily  16. magnesium oxide 400 mg tablet, 1 p.o. daily  17. alprazolam 0.25 mg tablet, QHS ____________________________ CHIEF COMPLAINTS  Followup of Arrhythmia-Atrial Fibrillation  Followup of CAD,Native ____________________________ HISTORY OF PRESENT ILLNESS  Patient seen for cardiac followup. He continues to have a modest amount of dyspnea but is largely recovered from his pneumonia. He still feels as if he is unable to do the normal amount  of activities that he was able to do prior to his illness and remains in atrial fibrillation. He denies PND, orthopnea or edema and his not currently having any angina. He has had no bleeding complications from warfarin. He was found to be in atrial fibrillation recently when he went in for pneumonia  and has been anticoagulated with warfarin with therapeutic INR  for over 3 weeks. ____________________________ PAST HISTORY  Past Medical Illnesses:  hypertension, hyperlipidemia, pneumonia, COPD, peripheral neuropathy, interstitial fibrosis;  Cardiovascular Illnesses:  CAD, peripheral vascular disease, mitral regurgitation;  Infectious Diseases:  no previous history of significant infectious diseases;  Surgical Procedures:  hernia repair;  Trauma History:  no previous history of significant trauma;  Cardiology Procedures-Invasive:  cardiac cath (left) April 2010, Xience DES stent  LAD April 2010;  Cardiology Procedures-Noninvasive:  regadenoson cardiolte, lexiscan cardiolite March 2011, echocardiogram August 2012;  Cardiac Cath Results:  30% stenosis proximal Left main, 90% stenosis mid LAD, no significant disease CFX, occluded RCA, left to right collateral RCA, well collateralized RCA;  Peripheral Vascular Procedures:  carotid doppler April 2010, lower extremity arterial doppler April 2010;  LVEF of 60% documented via echocardiogram on 04/01/2011 ____________________________ CARDIO-PULMONARY TEST DATES EKG Date:  08/03/2011;   Cardiac Cath Date:  12/14/2008;  Stent Placement Date: 12/14/2008;  Nuclear Study Date:  11/28/2009;   Echocardiography Date: 04/01/2011;  Chest Xray Date: 11/26/2009;  CT Scan Date:  11/26/2009   ____________________________ SOCIAL HISTORY Alcohol Use:  no alcohol use;  Smoking:  40 pack year history, used to smoke but quit March 2011;  Diet:  regular diet;  Lifestyle:  divorced;  Exercise:  no regular exercise;  Occupation:  part time Arts administrator;  Residence:  lives with daughter;   Spouse's Occupation:  divorced ____________________________ REVIEW OF SYSTEMS General:  malaise and fatigue  Eyes:  laser treatments, cataract extraction  Respiratory:  dyspnea with exertion  Cardiovascular:  please review HPI  Abdominal:  dyspepsia  Genitourinary-Male:  nocturia  Musculoskeletal:  chronic low back pain  Neurological:  neuropathy ____________________________ PHYSICAL EXAMINATION VITAL SIGNS  Blood Pressure:  116/66 Sitting, Right arm, regular cuff  , 120/64 Standing, Right arm and regular cuff   Pulse:  84/min. Weight:  169.00 lbs. Height:  71"BMI: 23  Constitutional:  pleasant thin, white male in no acute distress Head:  normocephalic, normal hair pattern, no masses or tenderness ENT:  ears, nose and throat reveal no gross abnormalities.  Dentition good. Neck:  no JVD, bilateral carotid bruits, no masses, non-tender Chest:  normal symmetry, increased A-P diameter, clear to auscultation and percussion Cardiac:  irregular rhythm, normal S1 and S2, no S3 or S4, grade 2/6 systolic murmur Peripheral Pulses:  femoral pulse 1+ on the right, femoral pulse 2+ on the left, pulses below the femoral arteries are diminished, bilateral femoral bruits present Extremities & Back:  bilateral venous insufficiency changes present Neurological:  no gross motor or sensory deficits noted, affect appropriate, oriented x3. ____________________________ MOST RECENT LIPID PANEL 02/10/11  CHOL TOTL 101 mg/dl, LDL 59 calc, HDL 24 mg/dl, TRIGLYCER 86 mg/dl and CHOL/HDL 4.1 (Calc) ____________________________ IMPRESSIONS/PLAN  1. Persistent atrial fibrillation 2. Coronary artery disease with prior stent 3. Significant COPD with interstitial fibrosis 4. Long-term anticoagulation with warfarin 5. Mitral regurgitation  Recommendations:  Discussed potential cardioversion since he remains symptomatic. He will discuss this with his daughter. Discussed cardioversion risks and he is agreeable to having  this doneand scheduled for 1/21. ____________________________ Cardiology Physician:  Darden Palmer. MD [facility]

## 2011-09-21 ENCOUNTER — Encounter (HOSPITAL_COMMUNITY): Payer: Self-pay | Admitting: Cardiology

## 2011-09-21 ENCOUNTER — Encounter (HOSPITAL_COMMUNITY): Payer: Self-pay | Admitting: Anesthesiology

## 2011-09-21 ENCOUNTER — Encounter (HOSPITAL_COMMUNITY)
Admission: RE | Disposition: A | Discharge status: Home / Self Care | Payer: Self-pay | Source: Ambulatory Visit | Attending: Cardiology

## 2011-09-21 ENCOUNTER — Other Ambulatory Visit: Payer: Self-pay

## 2011-09-21 ENCOUNTER — Ambulatory Visit (HOSPITAL_COMMUNITY): Payer: Medicare Other | Admitting: Anesthesiology

## 2011-09-21 ENCOUNTER — Ambulatory Visit (HOSPITAL_COMMUNITY)
Admission: RE | Admit: 2011-09-21 | Discharge: 2011-09-21 | Disposition: A | Discharge status: Home / Self Care | Payer: Medicare Other | Source: Ambulatory Visit | Attending: Cardiology | Admitting: Cardiology

## 2011-09-21 DIAGNOSIS — I4891 Unspecified atrial fibrillation: Secondary | ICD-10-CM | POA: Insufficient documentation

## 2011-09-21 HISTORY — PX: CARDIOVERSION: SHX1299

## 2011-09-21 LAB — PROTIME-INR: INR: 2.34 — ABNORMAL HIGH (ref 0.00–1.49)

## 2011-09-21 SURGERY — CARDIOVERSION
Anesthesia: General | Wound class: Clean

## 2011-09-21 MED ORDER — SODIUM CHLORIDE 0.45 % IV SOLN: INTRAVENOUS | Status: DC

## 2011-09-21 MED ORDER — HYDROCORTISONE 1 % EX CREA: 1.0000 "application " | TOPICAL_CREAM | Freq: Three times a day (TID) | CUTANEOUS | Status: AC | PRN

## 2011-09-21 MED ORDER — PROPOFOL 10 MG/ML IV EMUL
INTRAVENOUS | Status: DC | PRN
  Administered 2011-09-21: 50 mg via INTRAVENOUS

## 2011-09-21 MED ORDER — SODIUM CHLORIDE 0.9 % IJ SOLN: 3.0000 mL | INTRAMUSCULAR | Status: DC | PRN

## 2011-09-21 MED ORDER — HYDROCORTISONE 1 % EX CREA: 1.0000 "application " | TOPICAL_CREAM | Freq: Three times a day (TID) | CUTANEOUS | Status: DC | PRN

## 2011-09-21 MED ORDER — SODIUM CHLORIDE 0.9 % IV SOLN
INTRAVENOUS | Status: DC | PRN
  Administered 2011-09-21: 13:00:00 via INTRAVENOUS

## 2011-09-21 MED ORDER — SODIUM CHLORIDE 0.9 % IJ SOLN: 3.0000 mL | Freq: Two times a day (BID) | INTRAMUSCULAR | Status: DC

## 2011-09-21 MED ORDER — SODIUM CHLORIDE 0.9 % IV SOLN: 250.0000 mL | INTRAVENOUS | Status: DC

## 2011-09-21 NOTE — Anesthesia Postprocedure Evaluation (Signed)
  Anesthesia Post-op Note  Patient: Travis Bond  Procedure(s) Performed:  CARDIOVERSION  Patient Location: PACU  Anesthesia Type: General  Level of Consciousness: awake and alert   Airway and Oxygen Therapy: Patient Spontanous Breathing and Patient connected to nasal cannula oxygen  Post-op Pain: none  Post-op Assessment: Post-op Vital signs reviewed, Patient's Cardiovascular Status Stable, Respiratory Function Stable and Patent Airway  Post-op Vital Signs: Reviewed and stable  Complications: No apparent anesthesia complications

## 2011-09-21 NOTE — H&P (Signed)
   Patient seen and examined.  No interval change in history and exam since last note 09/17/11.  Stable for procedure.  Darden Palmer. MD [facility]  09/21/2011

## 2011-09-21 NOTE — Anesthesia Preprocedure Evaluation (Addendum)
Anesthesia Evaluation    History of Anesthesia Complications (+) AWARENESS UNDER ANESTHESIA  Airway Mallampati: III TM Distance: >3 FB   Mouth opening: Limited Mouth Opening  Dental  (+) Lower Dentures and Upper Dentures   Pulmonary pneumonia , COPDformer smoker Pulmonary Fibrosis   Pulmonary exam normal       Cardiovascular Exercise Tolerance: Poor hypertension, + CAD and + Peripheral Vascular Disease + dysrhythmias Atrial Fibrillation Irregular Abnormal    Neuro/Psych Depression  Neuromuscular disease    GI/Hepatic   Endo/Other  Hypothyroidism   Renal/GU      Musculoskeletal   Abdominal   Peds  Hematology   Anesthesia Other Findings   Reproductive/Obstetrics                          Anesthesia Physical Anesthesia Plan  ASA: III  Anesthesia Plan: General   Post-op Pain Management:    Induction: Intravenous  Airway Management Planned: Mask  Additional Equipment:   Intra-op Plan:   Post-operative Plan: Extubation in OR  Informed Consent: I have reviewed the patients History and Physical, chart, labs and discussed the procedure including the risks, benefits and alternatives for the proposed anesthesia with the patient or authorized representative who has indicated his/her understanding and acceptance.     Plan Discussed with: CRNA  Anesthesia Plan Comments:         Anesthesia Quick Evaluation

## 2011-09-21 NOTE — Transfer of Care (Signed)
Immediate Anesthesia Transfer of Care Note  Patient: Travis Bond  Procedure(s) Performed:  CARDIOVERSION  Patient Location: Short Stay  Anesthesia Type: General  Level of Consciousness: awake, alert , oriented and patient cooperative  Airway & Oxygen Therapy: Patient Spontanous Breathing and Patient connected to nasal cannula oxygen  Post-op Assessment: Report given to PACU RN and Post -op Vital signs reviewed and stable  Post vital signs: Reviewed and stable  Complications: No apparent anesthesia complications

## 2011-09-21 NOTE — Preoperative (Signed)
Beta Blockers   Took Coreg 3.175 mg this early am

## 2011-09-21 NOTE — Op Note (Signed)
Electrical Cardioversion Procedure Note Travis Bond [id number] 1926/09/24  Procedure: Electrical Cardioversion Indications:  Atrial Fibrillation  Time Out: Verified patient identification, verified procedure,medications/allergies/relevent history reviewed, required imaging and test results available.  Performed  Procedure Details  The patient was NPO after midnight. Anesthesia was administered at the beside  by Dr.Ftizgerald with 50mg  of propofol.  Cardioversion was done with synchronized biphasic defibrillation with AP pads with 100 watts.  The patient converted to normal sinus rhythm with PAC's. The patient tolerated the procedure well   IMPRESSION:  Successful cardioversion of atrial fibrillation    W. [name], Montez Hageman. MD [facility] 09/21/2011, 1:22 PM

## 2011-09-22 ENCOUNTER — Encounter (HOSPITAL_COMMUNITY): Payer: Self-pay | Admitting: Cardiology

## 2011-10-15 ENCOUNTER — Ambulatory Visit
Admission: RE | Admit: 2011-10-15 | Discharge: 2011-10-15 | Disposition: A | Discharge status: Home / Self Care | Payer: Medicare Other | Source: Ambulatory Visit | Attending: Cardiology | Admitting: Cardiology

## 2011-10-15 ENCOUNTER — Other Ambulatory Visit: Payer: Self-pay | Admitting: Cardiology

## 2011-10-15 DIAGNOSIS — R0602 Shortness of breath: Secondary | ICD-10-CM

## 2011-11-05 ENCOUNTER — Ambulatory Visit (INDEPENDENT_AMBULATORY_CARE_PROVIDER_SITE_OTHER): Payer: Medicare Other | Admitting: Pulmonary Disease

## 2011-11-05 ENCOUNTER — Encounter: Payer: Self-pay | Admitting: Pulmonary Disease

## 2011-11-05 DIAGNOSIS — J449 Chronic obstructive pulmonary disease, unspecified: Secondary | ICD-10-CM

## 2011-11-05 DIAGNOSIS — J8410 Pulmonary fibrosis, unspecified: Secondary | ICD-10-CM

## 2011-11-05 NOTE — Assessment & Plan Note (Signed)
He is to continue supplemental oxygen 24/7.   

## 2011-11-05 NOTE — Progress Notes (Signed)
Chief Complaint  Patient presents with  . COPD    Pt c/o dry cough, occasional wheezing, DOE...he denies any fluid retention.   CC: Travis Bond   History of Present Illness: Travis Bond is a 78 y.o. male former smoker with COPD, Pulmonary fibrosis, and hypoxemia.  He has been doing okay.  He uses his nebulizer 2 times per day, but feels like he could use it more.  He has occasional nose bleeds.  He is not having much cough, wheeze, or chest congestion.  He denies leg swelling.  Past Medical History  Diagnosis Date  . COPD (chronic obstructive pulmonary disease)   . Chronic respiratory failure with hypoxia   . Pulmonary fibrosis   . Coronary artery disease   . Hypertension   . Hyperlipidemia   . Carotid stenosis   . Peripheral vascular disease   . Anemia   . Peripheral neuropathy   . B12 deficiency   . Pneumonia   . CAD (coronary artery disease)   . Atrial fibrillation 07/14/2011  . Hypothyroidism 07/15/2011    Past Surgical History  Procedure Date  . Inguinal hernia repair 1994  . Cardioversion 09/21/2011    Procedure: CARDIOVERSION;  Surgeon: Darden Palmer., MD;  Location: [facility] OR;  Service: Cardiovascular;  Laterality: N/A;    No Known Allergies  Physical Exam:  Blood pressure 120/72, pulse 103, temperature 97.7 F (36.5 C), temperature source Oral, height 5' 10.5" (1.791 m), weight 161 lb 6.4 oz (73.211 kg), SpO2 94.00%. Body mass index is 22.83 kg/(m^2). Wt Readings from Last 2 Encounters:  11/05/11 161 lb 6.4 oz (73.211 kg)  09/21/11 164 lb (74.39 kg)    General - thin  HEENT - no sinus tenderness, clear nasal drainage, no oral exudate, no LAN  Cardiac - s1s2 irregular, with 2/6 SM  Chest - no wheeze  Abd - soft, nontender  Ext - no edema  Neuro - normal strength  Skin - no rashes  Psych - normal mood/behavior   Dg Chest 2 View  10/15/2011  *RADIOLOGY REPORT*  Clinical Data: Shortness of breath, congestion  CHEST - 2 VIEW  Comparison:  Chest x-ray of 09/17/2011  Findings: The lungs are hyperaerated consistent with COPD.  No infiltrate or effusion is seen.  Mild basilar markings are present most consistent with fibrosis.  Mediastinal contours appear stable. The heart is within normal limits in size.  There are degenerative changes throughout the thoracic spine.  IMPRESSION: COPD and mild basilar fibrosis.  No active lung disease.   Original Report Authenticated By: Juline Patch, M.D.    Assessment/Plan:  Outpatient Encounter Prescriptions as of 11/05/2011  Medication Sig Dispense Refill  . albuterol (PROVENTIL) (2.5 MG/3ML) 0.083% nebulizer solution Take 2.5 mg by nebulization 2 (two) times daily. Shortness of breath      . albuterol (PROVENTIL,VENTOLIN) 90 MCG/ACT inhaler Inhale 2 puffs into the lungs as needed. Shortness of breath.       . ALPRAZolam (XANAX) 0.25 MG tablet Take 0.25 mg by mouth at bedtime as needed. sleep      . clopidogrel (PLAVIX) 75 MG tablet Take 75 mg by mouth daily.        . digoxin (LANOXIN) 0.25 MG tablet Take 0.25 mg by mouth daily.      . furosemide (LASIX) 40 MG tablet Take 40 mg by mouth daily.      Marland Kitchen gabapentin (NEURONTIN) 300 MG capsule Take 300 mg by mouth 2 (two) times daily.       Marland Kitchen  guaifenesin (HUMIBID E) 400 MG TABS Take 400 mg by mouth 2 (two) times daily.      . hydrocortisone cream 1 % Apply 1 application topically 3 (three) times daily as needed (skin irritation).  30 g  0  . ipratropium (ATROVENT) 0.02 % nebulizer solution Take 500 mcg by nebulization 2 (two) times daily.       . isosorbide mononitrate (IMDUR) 60 MG 24 hr tablet Take 60 mg by mouth daily.        Marland Kitchen levothyroxine (SYNTHROID, LEVOTHROID) 25 MCG tablet Take 25 mcg by mouth daily before breakfast.      . magnesium oxide (MAG-OX) 400 MG tablet Take 400 mg by mouth daily.      . niacin (NIASPAN) 500 MG CR tablet Take 500 mg by mouth at bedtime.        . nitroGLYCERIN (NITROSTAT) 0.4 MG SL tablet Place 0.4 mg under the tongue  as needed. Chest pain      . pravastatin (PRAVACHOL) 20 MG tablet Take 20 mg by mouth. 1 BY MOUTH AT BEDTIME       . warfarin (COUMADIN) 4 MG tablet Take 4 mg by mouth daily with supper.      Marland Kitchen DISCONTD: carvedilol (COREG) 3.125 MG tablet Take 3.125 mg by mouth 2 (two) times daily at 10 AM and 5 PM.       . DISCONTD: guaiFENesin (MUCINEX) 600 MG 12 hr tablet Take 600 mg by mouth 2 (two) times daily.         Exa Bomba Pager:  [phone] 11/05/2011, 2:27 PM

## 2011-11-05 NOTE — Patient Instructions (Signed)
Follow up in 6 months 

## 2011-11-05 NOTE — Assessment & Plan Note (Signed)
Advised that he could try using his nebulizer more often, particularly before activity.

## 2011-11-16 ENCOUNTER — Other Ambulatory Visit (HOSPITAL_BASED_OUTPATIENT_CLINIC_OR_DEPARTMENT_OTHER): Payer: Self-pay | Admitting: Internal Medicine

## 2011-11-16 DIAGNOSIS — I714 Abdominal aortic aneurysm, without rupture: Secondary | ICD-10-CM

## 2011-11-18 ENCOUNTER — Telehealth: Payer: Self-pay | Admitting: Pulmonary Disease

## 2011-11-18 NOTE — Telephone Encounter (Signed)
Per [employer] they are just re submitting pt claim and they just have to send a letter advising the of this. Per [employer] they currently do not need anything from our office but will call us if they do. I called Renee to make her aware of this and she voiced her understanding and had no questions

## 2011-11-19 ENCOUNTER — Ambulatory Visit
Admission: RE | Admit: 2011-11-19 | Discharge: 2011-11-19 | Disposition: A | Discharge status: Home / Self Care | Payer: Medicare Other | Source: Ambulatory Visit | Attending: Internal Medicine | Admitting: Internal Medicine

## 2011-11-19 DIAGNOSIS — I714 Abdominal aortic aneurysm, without rupture: Secondary | ICD-10-CM

## 2011-11-24 ENCOUNTER — Other Ambulatory Visit: Payer: Self-pay | Admitting: Pulmonary Disease

## 2011-11-24 MED ORDER — ALBUTEROL SULFATE HFA 108 (90 BASE) MCG/ACT IN AERS: INHALATION_SPRAY | RESPIRATORY_TRACT | Status: DC

## 2011-11-25 DIAGNOSIS — I714 Abdominal aortic aneurysm, without rupture: Secondary | ICD-10-CM | POA: Insufficient documentation

## 2011-12-04 ENCOUNTER — Encounter: Payer: Medicare Other | Admitting: Vascular Surgery

## 2012-02-12 ENCOUNTER — Telehealth: Payer: Self-pay | Admitting: Pulmonary Disease

## 2012-02-12 NOTE — Telephone Encounter (Signed)
Will print out rx's on Monday and have VS sign these when he is in office and will fax over to [employer]

## 2012-02-15 MED ORDER — ALBUTEROL SULFATE (2.5 MG/3ML) 0.083% IN NEBU: 2.5000 mg | INHALATION_SOLUTION | Freq: Four times a day (QID) | RESPIRATORY_TRACT | Status: DC | PRN

## 2012-02-15 MED ORDER — IPRATROPIUM BROMIDE 0.02 % IN SOLN: 500.0000 ug | Freq: Two times a day (BID) | RESPIRATORY_TRACT | Status: DC

## 2012-02-15 NOTE — Telephone Encounter (Signed)
RX's have been printed off and will have VS sign these when he comes into the office and fax back to [employer]

## 2012-02-15 NOTE — Telephone Encounter (Signed)
RX's has been signed and i faxed this back over to [employer]. Will sign off message

## 2012-02-16 ENCOUNTER — Telehealth: Payer: Self-pay | Admitting: Pulmonary Disease

## 2012-02-16 NOTE — Telephone Encounter (Signed)
I called # listed and was told the branch was currently closed for the day. WCB in the AM

## 2012-02-17 NOTE — Telephone Encounter (Signed)
Spoke with Travis Bond; nothing needed. He fixed the issue with the Rx in the back ground.

## 2012-02-22 ENCOUNTER — Other Ambulatory Visit: Payer: Self-pay | Admitting: Cardiology

## 2012-03-28 ENCOUNTER — Telehealth: Payer: Self-pay | Admitting: Pulmonary Disease

## 2012-03-28 NOTE — Telephone Encounter (Signed)
Returning call can be reached at [phone].Travis Bond

## 2012-03-28 NOTE — Telephone Encounter (Signed)
[id number] x1 

## 2012-03-28 NOTE — Telephone Encounter (Signed)
Pt's daughter, Travis Bond, calling triage.  Stated she is returning a call.  Not sure if it is from earlier or not.  Travis Bond

## 2012-03-28 NOTE — Telephone Encounter (Addendum)
Called and spoke with pt's daughter Luster Landsberg. She states that Medicare is no longer paying for o2. She called Medicare and was advised that it was b/c of how [employer] was coding pt's dx. I called [employer] and spoke with Okey Regal, the branch Manager and she states that she is aware of this case, and that it has been sent to [facility] for Redetermination and that this can take between 4-8 wks. She states that she will call Renee to discuss. I made Renee aware of this and she verbalized understanding and states nothing further needed.

## 2012-05-02 ENCOUNTER — Other Ambulatory Visit: Payer: Self-pay | Admitting: Cardiology

## 2012-05-10 ENCOUNTER — Encounter: Payer: Self-pay | Admitting: Pulmonary Disease

## 2012-05-10 ENCOUNTER — Ambulatory Visit (INDEPENDENT_AMBULATORY_CARE_PROVIDER_SITE_OTHER): Payer: Medicare Other | Admitting: Pulmonary Disease

## 2012-05-10 VITALS — BP 124/64 | HR 81 | Temp 97.8°F | Ht 70.25 in | Wt 160.8 lb

## 2012-05-10 DIAGNOSIS — J8410 Pulmonary fibrosis, unspecified: Secondary | ICD-10-CM

## 2012-05-10 DIAGNOSIS — J449 Chronic obstructive pulmonary disease, unspecified: Secondary | ICD-10-CM

## 2012-05-10 DIAGNOSIS — F419 Anxiety disorder, unspecified: Secondary | ICD-10-CM

## 2012-05-10 DIAGNOSIS — Z23 Encounter for immunization: Secondary | ICD-10-CM

## 2012-05-10 DIAGNOSIS — F411 Generalized anxiety disorder: Secondary | ICD-10-CM

## 2012-05-10 MED ORDER — ALPRAZOLAM 0.25 MG PO TABS: 0.2500 mg | ORAL_TABLET | Freq: Every evening | ORAL | Status: DC | PRN

## 2012-05-10 NOTE — Assessment & Plan Note (Signed)
Stable on current nebulizer regimen.  He got his flu shot today.

## 2012-05-10 NOTE — Assessment & Plan Note (Signed)
Stable.  Monitor clinically.  Continue oxygen with exertion and sleep.   

## 2012-05-10 NOTE — Progress Notes (Signed)
Chief Complaint  Patient presents with  . COPD    Pt states breathing has been okay since last OV.    CC: Travis Bond   History of Present Illness: Travis Bond is a 78 y.o. male former smoker with COPD, Pulmonary fibrosis, and hypoxemia.  He uses his nebulizer 2 to 3 times per day, and this helps.  He gets occasional cough and wheeze.  He gets around okay.  He uses his oxygen at night, and when he is doing a lot of activity.  He denies chest pain or leg swelling.  He uses xanax at night to help him sleep.  This also helps take the edge off his breathing.   Past Medical History  Diagnosis Date  . COPD (chronic obstructive pulmonary disease)   . Chronic respiratory failure with hypoxia   . Pulmonary fibrosis   . Coronary artery disease   . Hypertension   . Hyperlipidemia   . Carotid stenosis   . Peripheral vascular disease   . Anemia   . Peripheral neuropathy   . B12 deficiency   . Pneumonia   . CAD (coronary artery disease)   . Atrial fibrillation 07/14/2011  . Hypothyroidism 07/15/2011    Past Surgical History  Procedure Date  . Inguinal hernia repair 1994  . Cardioversion 09/21/2011    Procedure: CARDIOVERSION;  Surgeon: Darden Palmer., MD;  Location: [facility] OR;  Service: Cardiovascular;  Laterality: N/A;    No Known Allergies  Physical Exam:  Blood pressure 124/64, pulse 81, temperature 97.8 F (36.6 C), temperature source Oral, height 5' 10.25" (1.784 m), weight 160 lb 12.8 oz (72.938 kg), SpO2 91.00%.  Body mass index is 22.91 kg/(m^2). Wt Readings from Last 2 Encounters:  05/10/12 160 lb 12.8 oz (72.938 kg)  11/05/11 161 lb 6.4 oz (73.211 kg)    General - thin  HEENT - no sinus tenderness, clear nasal drainage, no oral exudate, no LAN  Cardiac - s1s2 irregular, with 2/6 SM  Chest - no wheeze  Abd - soft, nontender  Ext - no edema  Neuro - normal strength  Skin - no rashes  Psych - normal mood/behavior    Assessment/Plan:  Outpatient  Encounter Prescriptions as of 05/10/2012  Medication Sig Dispense Refill  . albuterol (PROVENTIL HFA;VENTOLIN HFA) 108 (90 BASE) MCG/ACT inhaler One to two puffs by mouth every four to six hours as needed for wheezing and Shortness of breath  1 Inhaler  3  . albuterol (PROVENTIL) (2.5 MG/3ML) 0.083% nebulizer solution Take 3 mLs (2.5 mg total) by nebulization 4 (four) times daily as needed for wheezing. Shortness of breath  360 mL  6  . clopidogrel (PLAVIX) 75 MG tablet Take 75 mg by mouth daily.        . digoxin (LANOXIN) 0.25 MG tablet Take 0.25 mg by mouth daily.      . furosemide (LASIX) 40 MG tablet Take 40 mg by mouth daily.      Marland Kitchen gabapentin (NEURONTIN) 300 MG capsule Take 300 mg by mouth 2 (two) times daily.       Marland Kitchen guaifenesin (HUMIBID E) 400 MG TABS Take 400 mg by mouth 2 (two) times daily.      Marland Kitchen ipratropium (ATROVENT) 0.02 % nebulizer solution Take 2.5 mLs (500 mcg total) by nebulization 2 (two) times daily.  75 mL  6  . isosorbide mononitrate (IMDUR) 60 MG 24 hr tablet Take 60 mg by mouth daily.        Marland Kitchen  levothyroxine (SYNTHROID, LEVOTHROID) 25 MCG tablet Take 25 mcg by mouth daily before breakfast.      . magnesium oxide (MAG-OX) 400 MG tablet Take 400 mg by mouth daily.      . niacin (NIASPAN) 500 MG CR tablet Take 500 mg by mouth at bedtime.        . nitroGLYCERIN (NITROSTAT) 0.4 MG SL tablet Place 0.4 mg under the tongue as needed. Chest pain      . pravastatin (PRAVACHOL) 20 MG tablet Take 20 mg by mouth. 1 BY MOUTH AT BEDTIME       . warfarin (COUMADIN) 4 MG tablet Take 4 mg by mouth daily with supper.      . ALPRAZolam (XANAX) 0.25 MG tablet Take 0.25 mg by mouth at bedtime as needed. sleep      . hydrocortisone cream 1 % Apply 1 application topically 3 (three) times daily as needed (skin irritation).  30 g  0  . DISCONTD: albuterol (PROVENTIL,VENTOLIN) 90 MCG/ACT inhaler Inhale 2 puffs into the lungs as needed. Shortness of breath.         Travis Bond Pager:   [phone] 05/10/2012, 3:32 PM

## 2012-05-10 NOTE — Patient Instructions (Signed)
Flu shot today Follow up in 6 months 

## 2012-05-10 NOTE — Assessment & Plan Note (Signed)
Refilled his xanax.  This helps his sleep, and makes his breathing easier.   

## 2012-07-20 ENCOUNTER — Other Ambulatory Visit: Payer: Self-pay | Admitting: *Deleted

## 2012-07-20 MED ORDER — DIGOXIN 250 MCG PO TABS: 0.2500 mg | ORAL_TABLET | Freq: Every day | ORAL | Status: DC

## 2012-08-01 ENCOUNTER — Telehealth: Payer: Self-pay | Admitting: Pulmonary Disease

## 2012-08-01 NOTE — Telephone Encounter (Signed)
Called and spoke with Okey Regal at [facility]. Since it was after 5 pm she wasn't able to get with the billing team. I will follow up with her in the morning. Called and spoke with pt's daughter, and advised her of the above. Advised the daughter that we would take care of the issue and get this resolved. Rhonda J Cobb

## 2012-08-01 NOTE — Telephone Encounter (Signed)
Spoke with daughter Travis Bond about pts O2. Not covered by Medicare. DME is [employer]. Daughter states that O2 has been covered in past by Medicare and now they are choosing to not cover it.  Message will be sent to [facility] to follow up on.   [facility] please advise, thanks.

## 2012-08-02 NOTE — Telephone Encounter (Signed)
Okey Regal with [facility] left message this morning that she is contacting billing office to see what is needed for pt's o2. Rhonda J Cobb

## 2012-08-03 NOTE — Telephone Encounter (Signed)
Per [email or web address]  medicare keeps denying his 28 put he has been called because [employer] has all the info they need he will not have to pay for it himself Tobe Sos

## 2012-09-20 ENCOUNTER — Encounter (INDEPENDENT_AMBULATORY_CARE_PROVIDER_SITE_OTHER): Payer: Self-pay | Admitting: Surgery

## 2012-09-20 ENCOUNTER — Other Ambulatory Visit: Payer: Self-pay | Admitting: Cardiology

## 2012-09-20 DIAGNOSIS — N6320 Unspecified lump in the left breast, unspecified quadrant: Secondary | ICD-10-CM

## 2012-09-22 ENCOUNTER — Encounter (INDEPENDENT_AMBULATORY_CARE_PROVIDER_SITE_OTHER): Payer: Self-pay | Admitting: Surgery

## 2012-09-28 ENCOUNTER — Ambulatory Visit (INDEPENDENT_AMBULATORY_CARE_PROVIDER_SITE_OTHER): Payer: Self-pay | Admitting: Surgery

## 2012-10-10 ENCOUNTER — Ambulatory Visit
Admission: RE | Admit: 2012-10-10 | Discharge: 2012-10-10 | Disposition: A | Discharge status: Home / Self Care | Payer: Medicare Other | Source: Ambulatory Visit | Attending: Cardiology | Admitting: Cardiology

## 2012-10-10 DIAGNOSIS — N6320 Unspecified lump in the left breast, unspecified quadrant: Secondary | ICD-10-CM

## 2012-10-14 ENCOUNTER — Ambulatory Visit (INDEPENDENT_AMBULATORY_CARE_PROVIDER_SITE_OTHER): Payer: Medicare Other | Admitting: Surgery

## 2012-11-02 ENCOUNTER — Telehealth: Payer: Self-pay | Admitting: Pulmonary Disease

## 2012-11-02 MED ORDER — ALPRAZOLAM 0.25 MG PO TABS: 0.2500 mg | ORAL_TABLET | Freq: Every evening | ORAL | Status: DC | PRN

## 2012-11-02 NOTE — Telephone Encounter (Signed)
RX has been sent to the pharmacy. Nothing further was needed 

## 2012-11-02 NOTE — Telephone Encounter (Signed)
Okay to send refill for alprazolam 0.25 mg one qhs prn, dispense 30 with 2 refills.

## 2012-11-02 NOTE — Telephone Encounter (Signed)
Alprazolam 0.25mg  ><take at bedtime prn #30 x5 Last fill 10-05-12 No Known Allergies Dr Craige Cotta please advise  Thank you

## 2012-11-10 ENCOUNTER — Encounter: Payer: Self-pay | Admitting: Pulmonary Disease

## 2012-11-10 ENCOUNTER — Other Ambulatory Visit: Payer: Self-pay | Admitting: Internal Medicine

## 2012-11-10 ENCOUNTER — Ambulatory Visit (INDEPENDENT_AMBULATORY_CARE_PROVIDER_SITE_OTHER): Payer: Medicare Other | Admitting: Pulmonary Disease

## 2012-11-10 VITALS — BP 132/68 | HR 70 | Temp 97.0°F | Ht 71.0 in | Wt 159.8 lb

## 2012-11-10 DIAGNOSIS — F419 Anxiety disorder, unspecified: Secondary | ICD-10-CM

## 2012-11-10 DIAGNOSIS — F411 Generalized anxiety disorder: Secondary | ICD-10-CM

## 2012-11-10 DIAGNOSIS — J8410 Pulmonary fibrosis, unspecified: Secondary | ICD-10-CM

## 2012-11-10 DIAGNOSIS — I714 Abdominal aortic aneurysm, without rupture: Secondary | ICD-10-CM

## 2012-11-10 DIAGNOSIS — R0982 Postnasal drip: Secondary | ICD-10-CM

## 2012-11-10 DIAGNOSIS — J329 Chronic sinusitis, unspecified: Secondary | ICD-10-CM

## 2012-11-10 DIAGNOSIS — J449 Chronic obstructive pulmonary disease, unspecified: Secondary | ICD-10-CM

## 2012-11-10 DIAGNOSIS — J4489 Other specified chronic obstructive pulmonary disease: Secondary | ICD-10-CM

## 2012-11-10 MED ORDER — MOMETASONE FUROATE 50 MCG/ACT NA SUSP: 2.0000 | Freq: Every day | NASAL | Status: DC

## 2012-11-10 NOTE — Patient Instructions (Signed)
Nasal irrigation (saline nasal spray) daily until feeling better, then as needed Nasonex two sprays each nostril daily until feeling better, then as needed Follow up in 6 months

## 2012-11-10 NOTE — Assessment & Plan Note (Signed)
Stable on his current nebulizer regimen.

## 2012-11-10 NOTE — Assessment & Plan Note (Signed)
Refilled his xanax.  This helps his sleep, and makes his breathing easier.

## 2012-11-10 NOTE — Assessment & Plan Note (Signed)
Stable.  Monitor clinically.  Continue oxygen with exertion and sleep.

## 2012-11-10 NOTE — Assessment & Plan Note (Signed)
He is to use nasal irrigation.  I have given him sample and sent script for nasonex.

## 2012-11-10 NOTE — Progress Notes (Signed)
Chief Complaint  Patient presents with  . COPD    Pt. is here to follow up with his breathing,  Pt. states he has wheezing and chest tightness when he awakes and some drainage and facial pressure      CC: Travis Bond   History of Present Illness: Travis Bond is a 79 y.o. male former smoker with COPD, Pulmonary fibrosis, and hypoxemia.  His breathing is doing okay.  His main problem is with his sinuses.  He gets congestion and post-nasal drip, especially in the morning.  He uses saline rinse occasionally and this helps some.  He gets more cough and wheeze when he has sinus drainage.  He uses his nebulizer three times per day, and this helps.  He uses his oxygen in the morning.  He gets tired after walking about 100 yards.   He denies chest pain, fever, hemoptysis, skin rash, or leg swelling.  TESTS: Spirometry 12/05/09 >> FEV1 1.20(47%), FVC 2.56(64%), FEV1% 46  Travis Bond  has a past medical history of COPD (chronic obstructive pulmonary disease); Chronic respiratory failure with hypoxia; Pulmonary fibrosis; Coronary artery disease; Hypertension; Hyperlipidemia; Carotid stenosis; Peripheral vascular disease; Anemia; Peripheral neuropathy; B12 deficiency; Pneumonia; CAD (coronary artery disease); Atrial fibrillation (07/14/2011); and Hypothyroidism (07/15/2011).  Travis Bond  has past surgical history that includes Inguinal hernia repair (1994) and Cardioversion (09/21/2011).  Prior to Admission medications   Medication Sig Start Date End Date Taking? Authorizing Provider  albuterol (PROVENTIL HFA;VENTOLIN HFA) 108 (90 BASE) MCG/ACT inhaler One to two puffs by mouth every four to six hours as needed for wheezing and Shortness of breath 11/24/11  Yes Coralyn Helling, MD  albuterol (PROVENTIL) (2.5 MG/3ML) 0.083% nebulizer solution Take 3 mLs (2.5 mg total) by nebulization 4 (four) times daily as needed for wheezing. Shortness of breath 02/15/12  Yes Coralyn Helling, MD  ALPRAZolam  Prudy Feeler) 0.25 MG tablet Take 1 tablet (0.25 mg total) by mouth at bedtime as needed for sleep. sleep 11/02/12  Yes Coralyn Helling, MD  clopidogrel (PLAVIX) 75 MG tablet Take 75 mg by mouth daily.     Yes Historical Provider, MD  digoxin (LANOXIN) 0.25 MG tablet Take 1 tablet (0.25 mg total) by mouth daily. 07/20/12 07/20/13 Yes Coralyn Helling, MD  gabapentin (NEURONTIN) 300 MG capsule Take 300 mg by mouth 2 (two) times daily.    Yes Historical Provider, MD  guaiFENesin (MUCINEX) 600 MG 12 hr tablet Take 600 mg by mouth 2 (two) times daily.   Yes Historical Provider, MD  ipratropium (ATROVENT) 0.02 % nebulizer solution Take 2.5 mLs (500 mcg total) by nebulization 2 (two) times daily. 02/15/12  Yes Coralyn Helling, MD  isosorbide mononitrate (IMDUR) 60 MG 24 hr tablet Take 60 mg by mouth daily.     Yes Historical Provider, MD  magnesium oxide (MAG-OX) 400 MG tablet Take 400 mg by mouth daily.   Yes Historical Provider, MD  niacin (NIASPAN) 500 MG CR tablet Take 500 mg by mouth at bedtime.     Yes Historical Provider, MD  nitroGLYCERIN (NITROSTAT) 0.4 MG SL tablet Place 0.4 mg under the tongue as needed. Chest pain   Yes Historical Provider, MD  pravastatin (PRAVACHOL) 20 MG tablet Take 20 mg by mouth. 1 BY MOUTH AT BEDTIME    Yes Historical Provider, MD  furosemide (LASIX) 40 MG tablet Take 40 mg by mouth daily. 07/22/11 07/21/12  Othella Boyer, MD  levothyroxine (SYNTHROID, LEVOTHROID) 25 MCG tablet Take 25 mcg by mouth daily before breakfast.  07/22/11 07/21/12  Othella Boyer, MD  warfarin (COUMADIN) 4 MG tablet Take 4 mg by mouth daily with supper. 07/22/11 07/21/12  Othella Boyer, MD    No Known Allergies   Physical Exam:  General - No distress ENT - No sinus tenderness, clear nasal discharge, no oral exudate, no LAN Cardiac - s1s2 regular, no murmur Chest - Decreased breath sounds, no wheeze/rales/dullness Back - No focal tenderness Abd - Soft, non-tender Ext - No edema Neuro - Normal  strength Skin - No rashes Psych - normal mood, and behavior   Assessment/Plan:  Coralyn Helling, MD [place] [facility] Pager:  [phone] 11/10/2012, 12:16 PM

## 2012-11-14 ENCOUNTER — Ambulatory Visit
Admission: RE | Admit: 2012-11-14 | Discharge: 2012-11-14 | Disposition: A | Discharge status: Home / Self Care | Payer: Medicare Other | Source: Ambulatory Visit | Attending: Internal Medicine | Admitting: Internal Medicine

## 2012-11-14 DIAGNOSIS — I714 Abdominal aortic aneurysm, without rupture: Secondary | ICD-10-CM

## 2012-11-14 MED ORDER — IOHEXOL 300 MG/ML  SOLN
100.0000 mL | Freq: Once | INTRAMUSCULAR | Status: AC | PRN
  Administered 2012-11-14: 100 mL via INTRAVENOUS

## 2012-11-18 ENCOUNTER — Other Ambulatory Visit: Payer: Self-pay | Admitting: *Deleted

## 2012-11-18 DIAGNOSIS — E039 Hypothyroidism, unspecified: Secondary | ICD-10-CM

## 2012-11-18 DIAGNOSIS — D539 Nutritional anemia, unspecified: Secondary | ICD-10-CM

## 2012-11-21 ENCOUNTER — Telehealth: Payer: Self-pay | Admitting: Pulmonary Disease

## 2012-11-21 ENCOUNTER — Encounter: Payer: Self-pay | Admitting: Internal Medicine

## 2012-11-21 ENCOUNTER — Ambulatory Visit (INDEPENDENT_AMBULATORY_CARE_PROVIDER_SITE_OTHER): Payer: Medicare Other | Admitting: Internal Medicine

## 2012-11-21 ENCOUNTER — Other Ambulatory Visit: Payer: Medicare Other

## 2012-11-21 VITALS — BP 132/70 | HR 50 | Temp 97.3°F | Ht 71.0 in | Wt 159.2 lb

## 2012-11-21 DIAGNOSIS — J449 Chronic obstructive pulmonary disease, unspecified: Secondary | ICD-10-CM

## 2012-11-21 DIAGNOSIS — J9610 Chronic respiratory failure, unspecified whether with hypoxia or hypercapnia: Secondary | ICD-10-CM

## 2012-11-21 MED ORDER — AZITHROMYCIN 250 MG PO TABS: ORAL_TABLET | ORAL | Status: DC

## 2012-11-21 MED ORDER — PREDNISONE (PAK) 10 MG PO TABS: ORAL_TABLET | ORAL | Status: DC

## 2012-11-21 NOTE — Patient Instructions (Addendum)
Zpack Prednisone 10 mg take  4 each am x 2 days,   2 each am x 2 days,  1 each am x2days and stop   Ok to increased nebulizer to every 4 hours if needed while sick and mucinex up 1200 mg every 12 hours

## 2012-11-21 NOTE — Progress Notes (Signed)
Chief Complaint  Patient presents with  . acute visit    pt states having increase sob,wheezing,productive cough at night. x 1 wk    CC: Travis Bond   History of Present Illness: Travis Bond is a 79 y.o. male former smoker with COPD, Pulmonary fibrosis, and hypoxemia.  His breathing is doing okay.  His main problem is with his sinuses.  He gets congestion and post-nasal drip, especially in the morning.  He uses saline rinse occasionally and this helps some.  He gets more cough and wheeze when he has sinus drainage.  He uses his nebulizer three times per day, and this helps.  He uses his oxygen in the morning.  He gets tired after walking about 100 yards.    11/21/2012 f/u ov/Travis Bond cc worse sob x one week with green mucus - sob with more than slow adl's no sob at rest.  No obvious daytime variabilty or assoc  r cp or chest tightness, subjective wheeze overt sinus or hb symptoms. No unusual exp hx or h/o childhood pna/ asthma or premature birth to his knowledge.   Sleeping ok without nocturnal  or early am exacerbation  of respiratory  c/o's or need for noct saba. Also denies any obvious fluctuation of symptoms with weather or environmental changes or other aggravating or alleviating factors except as outlined above  ROS  The following are not active complaints unless bolded sore throat, dysphagia, dental problems, itching, sneezing,  nasal congestion or excess/ purulent secretions, ear ache,   fever, chills, sweats, unintended wt loss, pleuritic or exertional cp, hemoptysis,  orthopnea pnd or leg swelling, presyncope, palpitations, heartburn, abdominal pain, anorexia, nausea, vomiting, diarrhea  or change in bowel or urinary habits, change in stools or urine, dysuria,hematuria,  rash, arthralgias, visual complaints, headache, numbness weakness or ataxia or problems with walking or coordination,  change in mood/affect or memory.           TESTS: Spirometry 12/05/09 >> FEV1  1.20(47%), FVC 2.56(64%), FEV1% 46  Travis Bond  has a past medical history of COPD (chronic obstructive pulmonary disease); Chronic respiratory failure with hypoxia; Pulmonary fibrosis; Coronary artery disease; Hypertension; Hyperlipidemia; Carotid stenosis; Peripheral vascular disease; Anemia; Peripheral neuropathy; B12 deficiency; Pneumonia; CAD (coronary artery disease); Atrial fibrillation (07/14/2011); and Hypothyroidism (07/15/2011).  Travis Bond  has past surgical history that includes Inguinal hernia repair (1994) and Cardioversion (09/21/2011).  Prior to Admission medications   Medication Sig Start Date End Date Taking? Authorizing Provider  albuterol (PROVENTIL HFA;VENTOLIN HFA) 108 (90 BASE) MCG/ACT inhaler One to two puffs by mouth every four to six hours as needed for wheezing and Shortness of breath 11/24/11  Yes Travis Helling, MD  albuterol (PROVENTIL) (2.5 MG/3ML) 0.083% nebulizer solution Take 3 mLs (2.5 mg total) by nebulization 4 (four) times daily as needed for wheezing. Shortness of breath 02/15/12  Yes Travis Helling, MD  ALPRAZolam Prudy Feeler) 0.25 MG tablet Take 1 tablet (0.25 mg total) by mouth at bedtime as needed for sleep. sleep 11/02/12  Yes Travis Helling, MD  clopidogrel (PLAVIX) 75 MG tablet Take 75 mg by mouth daily.     Yes Historical Provider, MD  digoxin (LANOXIN) 0.25 MG tablet Take 1 tablet (0.25 mg total) by mouth daily. 07/20/12 07/20/13 Yes Travis Helling, MD  gabapentin (NEURONTIN) 300 MG capsule Take 300 mg by mouth 2 (two) times daily.    Yes Historical Provider, MD  guaiFENesin (MUCINEX) 600 MG 12 hr tablet Take 600 mg by mouth 2 (two) times daily.  Yes Historical Provider, MD  ipratropium (ATROVENT) 0.02 % nebulizer solution Take 2.5 mLs (500 mcg total) by nebulization 2 (two) times daily. 02/15/12  Yes Travis Helling, MD  isosorbide mononitrate (IMDUR) 60 MG 24 hr tablet Take 60 mg by mouth daily.     Yes Historical Provider, MD  magnesium oxide (MAG-OX) 400 MG  tablet Take 400 mg by mouth daily.   Yes Historical Provider, MD  niacin (NIASPAN) 500 MG CR tablet Take 500 mg by mouth at bedtime.     Yes Historical Provider, MD  nitroGLYCERIN (NITROSTAT) 0.4 MG SL tablet Place 0.4 mg under the tongue as needed. Chest pain   Yes Historical Provider, MD  pravastatin (PRAVACHOL) 20 MG tablet Take 20 mg by mouth. 1 BY MOUTH AT BEDTIME    Yes Historical Provider, MD  furosemide (LASIX) 40 MG tablet Take 40 mg by mouth daily. 07/22/11 07/21/12  Travis Boyer, MD  levothyroxine (SYNTHROID, LEVOTHROID) 25 MCG tablet Take 25 mcg by mouth daily before breakfast. 07/22/11 07/21/12  Travis Boyer, MD  warfarin (COUMADIN) 4 MG tablet Take 4 mg by mouth daily with supper. 07/22/11 07/21/12  Travis Boyer, MD    No Known Allergies   Physical Exam:  General - No distress ENT - No sinus tenderness, clear nasal discharge, no oral exudate, no LAN - edentulous with dentures in place Cardiac - s1s2 regular, no murmur Chest - Decreased breath sounds, no wheeze/rales/dullness Back - No focal tenderness Abd - Soft, non-tender Ext - No edema Neuro - Normal strength Skin - No rashes Psych - normal mood, and behavior

## 2012-11-21 NOTE — Telephone Encounter (Signed)
I spoke with daughter. She stated pt has been having a lot of congestion, SOB, chest tx, coughing up tinged blood sputum. He is wearing his oxygen. She is afraid he may have PND. Pt is scheduled to come in and see MW at 11:00 today for acute visit (okay per leslie). I will forward to Dr. Craige Cotta so he is aware of this.

## 2012-11-23 DIAGNOSIS — J9611 Chronic respiratory failure with hypoxia: Secondary | ICD-10-CM | POA: Insufficient documentation

## 2012-11-23 NOTE — Assessment & Plan Note (Signed)
Acute exac assoc with purulent tracheobronchitis > rx zpak and prednisone    Each maintenance medication was reviewed in detail including most importantly the difference between maintenance and as needed and under what circumstances the prns are to be used.  Please see instructions for details which were reviewed in writing and the patient given a copy.

## 2012-11-23 NOTE — Assessment & Plan Note (Signed)
Adequate control on present rx, reviewed > note despite exac and rec for continuous 02 his sats are adequate at rest so 02 rx may need to be revised longterm > defer to Dr Craige Cotta

## 2012-12-01 ENCOUNTER — Encounter: Payer: Self-pay | Admitting: Vascular Surgery

## 2012-12-02 ENCOUNTER — Ambulatory Visit (INDEPENDENT_AMBULATORY_CARE_PROVIDER_SITE_OTHER): Payer: Medicare Other | Admitting: Vascular Surgery

## 2012-12-02 ENCOUNTER — Encounter: Payer: Self-pay | Admitting: Vascular Surgery

## 2012-12-02 VITALS — BP 109/67 | HR 67 | Ht 71.0 in | Wt 160.7 lb

## 2012-12-02 DIAGNOSIS — Z48812 Encounter for surgical aftercare following surgery on the circulatory system: Secondary | ICD-10-CM

## 2012-12-02 DIAGNOSIS — I714 Abdominal aortic aneurysm, without rupture: Secondary | ICD-10-CM

## 2012-12-02 NOTE — Progress Notes (Signed)
[facility]  Referred by: Maxwell Caul, MD [address] [place], [place] [address]  Reason for referral: AAA  History of Present Illness  The patient is a 79 y.o. (13-Dec-1926) male who presents with chief complaint: aneurysm found on exam.  Previous studies demonstrate an AAA, measuring 4.5 cm.  The patient does have abdominal pain.  He chronically has back pain.  The patient does not have history of embolic episodes from the AAA.  The patient's risk factors for AAA included: age, sex, and prior smoking.  The patient no longer smokes cigarettes.  The patient does note some mild claudication in both legs with rest pain.  Past Medical History  Diagnosis Date  . COPD (chronic obstructive pulmonary disease)   . Chronic respiratory failure with hypoxia   . Pulmonary fibrosis   . Coronary artery disease   . Hypertension   . Hyperlipidemia   . Carotid stenosis   . Peripheral vascular disease   . Anemia   . Peripheral neuropathy   . B12 deficiency   . Pneumonia   . CAD (coronary artery disease)   . Atrial fibrillation 07/14/2011  . Hypothyroidism 07/15/2011  . Myocardial infarction     Past Surgical History  Procedure Laterality Date  . Inguinal hernia repair  1994  . Cardioversion  09/21/2011    Procedure: CARDIOVERSION;  Surgeon: Darden Palmer., MD;  Location: [facility] OR;  Service: Cardiovascular;  Laterality: N/A;  . Cardiac stent  2010 or 2011 pt not sure    History   Social History  . Marital Status: Single    Spouse Name: N/A    Number of Children: N/A  . Years of Education: N/A   Occupational History  . Not on file.   Social History Main Topics  . Smoking status: Former Smoker -- 1.00 packs/day for 65 years    Types: Cigarettes    Quit date: 01/18/2010  . Smokeless tobacco: Never Used  . Alcohol Use: No  . Drug Use: No  . Sexually Active: Not on file   Other Topics Concern  . Not on file   Social History Narrative   Divorced.  Lives with daughter          Family History  Problem Relation Age of Onset  . Heart failure Father   . Heart failure Mother   . Coronary artery disease Brother     Current Outpatient Prescriptions on File Prior to Visit  Medication Sig Dispense Refill  . albuterol (PROVENTIL HFA;VENTOLIN HFA) 108 (90 BASE) MCG/ACT inhaler One to two puffs by mouth every four to six hours as needed for wheezing and Shortness of breath  1 Inhaler  3  . albuterol (PROVENTIL) (2.5 MG/3ML) 0.083% nebulizer solution Take 3 mLs (2.5 mg total) by nebulization 4 (four) times daily as needed for wheezing. Shortness of breath  360 mL  6  . ALPRAZolam (XANAX) 0.25 MG tablet Take 1 tablet (0.25 mg total) by mouth at bedtime as needed for sleep. sleep  30 tablet  2  . azithromycin (ZITHROMAX) 250 MG tablet Take 2 on day one then 1 daily x 4 days  6 tablet  0  . clopidogrel (PLAVIX) 75 MG tablet Take 75 mg by mouth daily.        . digoxin (LANOXIN) 0.25 MG tablet Take 1 tablet (0.25 mg total) by mouth daily.  90 tablet  1  . gabapentin (NEURONTIN) 300 MG capsule Take 300 mg by  mouth 2 (two) times daily.       Marland Kitchen guaiFENesin (MUCINEX) 600 MG 12 hr tablet Take 600 mg by mouth 2 (two) times daily.      Marland Kitchen ipratropium (ATROVENT) 0.02 % nebulizer solution Take 2.5 mLs (500 mcg total) by nebulization 2 (two) times daily.  75 mL  6  . isosorbide mononitrate (IMDUR) 60 MG 24 hr tablet Take 60 mg by mouth daily.        . magnesium oxide (MAG-OX) 400 MG tablet Take 400 mg by mouth daily.      . mometasone (NASONEX) 50 MCG/ACT nasal spray Place 2 sprays into the nose daily.  17 g  2  . niacin (NIASPAN) 500 MG CR tablet Take 500 mg by mouth at bedtime.        . nitroGLYCERIN (NITROSTAT) 0.4 MG SL tablet Place 0.4 mg under the tongue as needed. Chest pain      . pravastatin (PRAVACHOL) 20 MG tablet Take 20 mg by mouth. 1 BY MOUTH AT BEDTIME       . predniSONE (STERAPRED UNI-PAK) 10 MG tablet Prednisone 10 mg take  4 each  am x 2 days,   2 each am x 2 days,  1 each am x2days and stop  14 tablet  0  . furosemide (LASIX) 40 MG tablet Take 40 mg by mouth daily.      Marland Kitchen levothyroxine (SYNTHROID, LEVOTHROID) 25 MCG tablet Take 25 mcg by mouth daily before breakfast.      . warfarin (COUMADIN) 4 MG tablet Take 4 mg by mouth daily with supper.      . [DISCONTINUED] carvedilol (COREG) 3.125 MG tablet Take 3.125 mg by mouth 2 (two) times daily at 10 AM and 5 PM.        No current facility-administered medications on file prior to visit.    No Known Allergies  REVIEW OF SYSTEMS:  (Positives checked otherwise negative)  CARDIOVASCULAR:  []  chest pain, []  chest pressure, []  palpitations, []  shortness of breath when laying flat, []  shortness of breath with exertion,  []  pain in feet when walking, []  pain in feet when laying flat, []  history of blood clot in veins (DVT), []  history of phlebitis, []  swelling in legs, []  varicose veins  PULMONARY:  []  productive cough, []  asthma, []  wheezing  NEUROLOGIC:  []  weakness in arms or legs, []  numbness in arms or legs, []  difficulty speaking or slurred speech, []  temporary loss of vision in one eye, []  dizziness  HEMATOLOGIC:  []  bleeding problems, []  problems with blood clotting too easily  MUSCULOSKEL:  []  joint pain, []  joint swelling  GASTROINTEST:  []  vomiting blood, []  blood in stool     GENITOURINARY:  []  burning with urination, []  blood in urine  PSYCHIATRIC:  []  history of major depression  INTEGUMENTARY:  []  rashes, []  ulcers  CONSTITUTIONAL:  []  fever, []  chills  PRE-ADM LIVING: []  Home, []  Nursing home, []  Homeless  AMB STATUS: []  Walking, []  Walking w/ Assistance, []  Wheelchair, [] Bed ridden  For [employer] Use RECENT HEART ATTACK (<6 mon): No  CAD Sx: []  No, [x]  Asx, h/o MI, []  Stable angina, []  Unstable angina  PRIOR CHF: [x]  No, []  Asx, []  Mild, []  Moderate, []  Severe  STRESS TEST: [x]  No, []  Normal, []  + ischemia, []  + MI, []  Both   Physical  Examination  Filed Vitals:   12/02/12 0927  BP: 109/67  Pulse: 67  Height: 5\' 11"  (1.803 m)  Weight:  160 lb 11.2 oz (72.893 kg)  SpO2: 96%   Body mass index is 22.42 kg/(m^2).  General: A&O x 3, WDWN, thin  Head: [place]/AT  Ear/Nose/Throat: Hearing grossly intact, nares w/o erythema or drainage, oropharynx w/o Erythema/Exudate  Eyes: PERRLA, EOMI  Neck: Supple, no nuchal rigidity, no palpable LAD  Pulmonary: Sym exp, good air movt, CTAB, no rales, rhonchi, & wheezing  Cardiac: RRR, Nl S1, S2, no Murmurs, rubs or gallops  Vascular: Vessel Right Left  Radial Palpable Palpable  Ulnar Palpable Palpable  Brachial Palpable Palpable  Carotid Palpable, without bruit Palpable, without bruit  Aorta Not palpable N/A  Femoral Palpable Palpable  Popliteal Not palpable Not palpable  PT Not Palpable Not Palpable  DP Not Palpable Not Palpable   Gastrointestinal: soft, NTND, -G/R, - HSM, - masses, - CVAT B  Musculoskeletal: M/S 5/5 throughout , Extremities without ischemic changes , somewhat cyanotic feet  Neurologic: CN 2-12 intact , Pain and light touch intact in extremities , Motor exam as listed above  Psychiatric: Judgment intact, Mood & affect appropriate for pt's clinical situation  Dermatologic: See M/S exam for extremity exam, no rashes otherwise noted  Lymph : No Cervical, Axillary, or Inguinal lymphadenopathy   CT Abd/pelvis (Date: 11/14/12)  1. Fusiform dilatation of the infrarenal abdominal aorta with maximum axial dimension of 4.8 x 4.4 cm. This aneurysm terminates at the bifurcation.  2. Chronic right hydronephrosis appears be due to right UPJ narrowing. No calculus or mass is evident.  3. Multiple bilateral renal cysts. No definite solid renal lesion is seen.  4. Left inguinal hernia containing fat.  5. Degenerative disc disease of the lumbar spine as noted above.  Based on my review of this patient's CT, he has a infrarenal AAA ~4.8 cm which is not compatible with  a bifurcated device but might be compatible with a Aortouniiliac device.  The right common femoral artery appears to be occluded.  Medical Decision Making  The patient is a 79 y.o. male who presents with: asx AAA.   Based on this patient's exam and diagnostic studies, he needs q6 month aortic duplex and a BLE ABI at the next appointment.  The threshold for repair is AAA size > 5.5 cm, growth > 1 cm/yr, and symptomatic status.  I emphasized the importance of maximal medical management including strict control of blood pressure, blood glucose, and lipid levels, antiplatelet agents, obtaining regular exercise, and cessation of smoking.    Thank you for allowing [place] to participate in this patient's care.  Leonides Sake, MD [facility] Office: [phone] Pager: [phone]  12/02/2012, 5:19 PM

## 2013-01-13 ENCOUNTER — Other Ambulatory Visit: Payer: Self-pay | Admitting: *Deleted

## 2013-01-13 MED ORDER — FUROSEMIDE 40 MG PO TABS: 40.0000 mg | ORAL_TABLET | Freq: Every day | ORAL | Status: DC

## 2013-02-07 ENCOUNTER — Telehealth: Payer: Self-pay | Admitting: Pulmonary Disease

## 2013-02-07 NOTE — Telephone Encounter (Signed)
LMOM TCB x1 for pt's daughter All the letter means is that it is time to re-qualify pt's O2 Saw MW 3.24.14 for acute visit; last ov w/ 3.13.14, follow up in 6 months He will need an ov and sats checked VS has an opening on 6.12.14 Letter from [place] still in triage on cork board

## 2013-02-08 ENCOUNTER — Telehealth: Payer: Self-pay | Admitting: Pulmonary Disease

## 2013-02-08 MED ORDER — ALPRAZOLAM 0.25 MG PO TABS: 0.2500 mg | ORAL_TABLET | Freq: Every evening | ORAL | Status: DC | PRN

## 2013-02-08 NOTE — Telephone Encounter (Signed)
Called spoke with Travis Bond Discussed with her the meaning of the letter she brought to the office yesterday VS now with no openings in June Ov scheduled with TP 6.17.14 @ 3:15pm for follow up and O2 re-qualification. Travis Bond okay with this date and time; nothing further needed at this time Will sign off.

## 2013-02-08 NOTE — Telephone Encounter (Signed)
Okay to refill? 

## 2013-02-08 NOTE — Telephone Encounter (Signed)
Received a refill request from [facility]. Pt is wanting a refill on Alprazolam 0.25mg  1 po QHS prn #30.  Last OV 11/10/12 No pending OV Last fill 10/31/12 #30 with 2 additional refills.  Please advise if you are okay with refilling this. Thanks.

## 2013-02-08 NOTE — Telephone Encounter (Signed)
Daughter returning call.  [phone]

## 2013-02-08 NOTE — Telephone Encounter (Signed)
Rx has been refilled per VS.

## 2013-02-14 ENCOUNTER — Encounter: Payer: Self-pay | Admitting: Adult Health

## 2013-02-14 ENCOUNTER — Ambulatory Visit (INDEPENDENT_AMBULATORY_CARE_PROVIDER_SITE_OTHER): Payer: Medicare Other | Admitting: Adult Health

## 2013-02-14 VITALS — BP 126/74 | HR 89 | Temp 97.7°F | Ht 71.0 in | Wt 160.4 lb

## 2013-02-14 DIAGNOSIS — J449 Chronic obstructive pulmonary disease, unspecified: Secondary | ICD-10-CM

## 2013-02-14 MED ORDER — IPRATROPIUM BROMIDE 0.02 % IN SOLN: 500.0000 ug | Freq: Three times a day (TID) | RESPIRATORY_TRACT | Status: DC

## 2013-02-14 MED ORDER — PREDNISONE 10 MG PO TABS: ORAL_TABLET | ORAL | Status: DC

## 2013-02-14 NOTE — Patient Instructions (Addendum)
Prednisone taper over next week.  Increase Ipratropium Neb Three times a day   May use Albuterol Neb every 4hr as needed.  Please contact office for sooner follow up if symptoms do not improve or worsen or seek emergency care  follow up Dr. Craige Cotta  In 2 months and As needed

## 2013-02-15 ENCOUNTER — Encounter: Payer: Self-pay | Admitting: Adult Health

## 2013-02-15 NOTE — Assessment & Plan Note (Signed)
Mild flare   Plan  Prednisone taper over next week.  Increase Ipratropium Neb Three times a day   May use Albuterol Neb every 4hr as needed.  Please contact office for sooner follow up if symptoms do not improve or worsen or seek emergency care  follow up Dr. Craige Cotta  In 2 months and As needed

## 2013-02-15 NOTE — Progress Notes (Signed)
  Subjective:    Patient ID: Travis Bond, male    DOB: 11/16/1926, 79 y.o.   MRN: [id number]  HPI  79 y.o. male former smoker with COPD, Pulmonary fibrosis, and hypoxemia.  02/14/13 Follow up  Pt return for follow up for COPD. Needs to to requalify for O2 thru [employer].   Today sats decreased 87% with walking on RA. On O2 at 2l/m 95%.  Complains over last 2 days more DOE, chest congestion and wheezing.  Feels heat is making his symptoms worse. No fever, discolored mucus Or edema. No calf pain or chest pain.  No otc used. Appetite is good. No wt loss.   TESTS  Spirometry 12/05/09 >> FEV1 1.20(47%), FVC 2.56(64%), FEV1% 46   Review of Systems Constitutional:   No  weight loss, night sweats,  Fevers, chills, + fatigue, or  lassitude.  HEENT:   No headaches,  Difficulty swallowing,  Tooth/dental problems, or  Sore throat,                No sneezing, itching, ear ache, nasal congestion, post nasal drip,   CV:  No chest pain,  Orthopnea, PND, swelling in lower extremities, anasarca, dizziness, palpitations, syncope.   GI  No heartburn, indigestion, abdominal pain, nausea, vomiting, diarrhea, change in bowel habits, loss of appetite, bloody stools.   Resp:   No coughing up of blood.  No change in color of mucus.    No chest wall deformity  Skin: no rash or lesions.  GU: no dysuria, change in color of urine, no urgency or frequency.  No flank pain, no hematuria   MS:  No joint pain or swelling.  No decreased range of motion.    Psych:  No change in mood or affect. No depression or anxiety.  No memory loss.         Objective:   Physical Exam  GEN: A/Ox3; pleasant , NAD  HEENT:  [place]/AT,  EACs-clear, TMs-wnl, NOSE-clear, THROAT-clear, no lesions, no postnasal drip or exudate noted.   NECK:  Supple w/ fair ROM; no JVD; normal carotid impulses w/o bruits; no thyromegaly or nodules palpated; no lymphadenopathy.  RESP  Diminshed BS in bases  w/o, wheezes/ rales/ or rhonchi.no  accessory muscle use, no dullness to percussion  CARD:  RRR, no m/r/g  , no peripheral edema, pulses intact, no cyanosis or clubbing.  GI:   Soft & nt; nml bowel sounds; no organomegaly or masses detected.  Musco: Warm bil, no deformities or joint swelling noted.   Neuro: alert, no focal deficits noted.    Skin: Warm, no lesions or rashes        Assessment & Plan:

## 2013-02-15 NOTE — Progress Notes (Signed)
Reviewed and agree with assessment/plan. 

## 2013-02-17 ENCOUNTER — Encounter: Payer: Self-pay | Admitting: *Deleted

## 2013-02-21 ENCOUNTER — Ambulatory Visit (INDEPENDENT_AMBULATORY_CARE_PROVIDER_SITE_OTHER): Payer: Medicare Other | Admitting: Internal Medicine

## 2013-02-21 ENCOUNTER — Encounter: Payer: Self-pay | Admitting: Internal Medicine

## 2013-02-21 VITALS — BP 124/64 | HR 60 | Temp 94.3°F | Resp 16 | Ht 70.5 in | Wt 160.4 lb

## 2013-02-21 DIAGNOSIS — E785 Hyperlipidemia, unspecified: Secondary | ICD-10-CM

## 2013-02-21 DIAGNOSIS — I4891 Unspecified atrial fibrillation: Secondary | ICD-10-CM

## 2013-02-21 DIAGNOSIS — J449 Chronic obstructive pulmonary disease, unspecified: Secondary | ICD-10-CM

## 2013-02-21 DIAGNOSIS — E039 Hypothyroidism, unspecified: Secondary | ICD-10-CM

## 2013-02-21 DIAGNOSIS — F411 Generalized anxiety disorder: Secondary | ICD-10-CM

## 2013-02-21 DIAGNOSIS — F419 Anxiety disorder, unspecified: Secondary | ICD-10-CM

## 2013-02-21 NOTE — Progress Notes (Signed)
Patient ID: Travis Bond, male   DOB: 06/20/1927, 79 y.o.   MRN: [id number]    PCP: Oneal Grout, MD  Code Status: full code  No Known Allergies  Chief Complaint: medical management of chronic illness  HPI:  79 y/o male patient is here for routine follow up. His right shoulder pain has improved. Reviewed note from Dr Nicky Pugh office and Dr Craige Cotta. Has upcoming appointment with cardiology Compliant with his medications Sedentary lifestyle  Review of Systems:  No fever or chills Appetite is good No SOB No chest pain or palpitations No nausea or vomiting, no abdominal pain Has regular bowel movement   Past Medical History  Diagnosis Date  . COPD (chronic obstructive pulmonary disease)   . Chronic respiratory failure with hypoxia   . Pulmonary fibrosis   . Coronary artery disease   . Hypertension   . Hyperlipidemia   . Carotid stenosis   . Peripheral vascular disease   . Anemia   . Peripheral neuropathy   . B12 deficiency   . Pneumonia   . CAD (coronary artery disease)   . Atrial fibrillation 07/14/2011  . Hypothyroidism 07/15/2011  . Myocardial infarction   . Anxiety state, unspecified   . Pain in joint, shoulder region   . Unspecified deficiency anemia   . Abdominal aneurysm without mention of rupture   . Other dyspnea and respiratory abnormality   . Aortic valve disorders   . Ulcer of other part of foot   . Cellulitis and abscess of foot, except toes   . Coronary atherosclerosis of native coronary artery   . Postinflammatory pulmonary fibrosis   . Type II or unspecified type diabetes mellitus without mention of complication, not stated as uncontrolled   . Other and unspecified hyperlipidemia   . Unspecified hereditary and idiopathic peripheral neuropathy   . Congestive heart failure, unspecified   . Microscopic hematuria    Past Surgical History  Procedure Laterality Date  . Inguinal hernia repair  1994  . Cardioversion  09/21/2011    Procedure:  CARDIOVERSION;  Surgeon: Darden Palmer., MD;  Location: [facility] OR;  Service: Cardiovascular;  Laterality: N/A;  . Cardiac stent  2010 or 2011 pt not sure  . Hernia repair  1989   Social History:   reports that he quit smoking about 3 years ago. His smoking use included Cigarettes. He has a 65 pack-year smoking history. He has never used smokeless tobacco. He reports that he does not drink alcohol or use illicit drugs.  Family History  Problem Relation Age of Onset  . Heart failure Father   . Heart failure Mother   . Coronary artery disease Brother     Medications: Patient's Medications  New Prescriptions   No medications on file  Previous Medications   ALBUTEROL (PROVENTIL HFA;VENTOLIN HFA) 108 (90 BASE) MCG/ACT INHALER    One to two puffs by mouth every four to six hours as needed for wheezing and Shortness of breath   ALBUTEROL (PROVENTIL) (2.5 MG/3ML) 0.083% NEBULIZER SOLUTION    Take 3 mLs (2.5 mg total) by nebulization 4 (four) times daily as needed for wheezing. Shortness of breath   ALPRAZOLAM (XANAX) 0.25 MG TABLET    Take 1 tablet (0.25 mg total) by mouth at bedtime as needed for sleep.   CLOPIDOGREL (PLAVIX) 75 MG TABLET    Take 75 mg by mouth daily.     DIGOXIN (LANOXIN) 0.25 MG TABLET    Take 1 tablet (0.25 mg total) by mouth  daily.   FUROSEMIDE (LASIX) 40 MG TABLET    Take 1 tablet (40 mg total) by mouth daily.   GABAPENTIN (NEURONTIN) 300 MG CAPSULE    Take 300 mg by mouth 2 (two) times daily.    GUAIFENESIN (MUCINEX) 600 MG 12 HR TABLET    Take 600 mg by mouth 2 (two) times daily.   IPRATROPIUM (ATROVENT) 0.02 % NEBULIZER SOLUTION    Take 2.5 mLs (500 mcg total) by nebulization 3 (three) times daily.   ISOSORBIDE MONONITRATE (IMDUR) 60 MG 24 HR TABLET    Take 60 mg by mouth daily.     LEVOTHYROXINE (SYNTHROID, LEVOTHROID) 50 MCG TABLET    Take 50 mcg by mouth daily before breakfast.   MAGNESIUM OXIDE (MAG-OX) 400 MG TABLET    Take 400 mg by mouth daily.   MOMETASONE  (NASONEX) 50 MCG/ACT NASAL SPRAY    Place 2 sprays into the nose daily.   NIACIN (NIASPAN) 500 MG CR TABLET    Take 500 mg by mouth at bedtime.     NITROGLYCERIN (NITROSTAT) 0.4 MG SL TABLET    Place 0.4 mg under the tongue as needed. Chest pain   PRAVASTATIN (PRAVACHOL) 20 MG TABLET    Take 20 mg by mouth. 1 BY MOUTH AT BEDTIME    PREDNISONE (DELTASONE) 10 MG TABLET    4 tabs for 2 days, then 3 tabs for 2 days, 2 tabs for 2 days, then 1 tab for 2 days, then stop   WARFARIN (COUMADIN) 4 MG TABLET    Take 4 mg by mouth daily with supper.  Modified Medications   No medications on file  Discontinued Medications   No medications on file  procedures: 2011 CT Scan Dr Donnie Aho  2011 MRI of the Chest Dr Donnie Aho 04/30/2010 DG Chest 2 View - Changes of COPD with pulmonary fibrotic changes. There is slight improvement in aeration of the right lung base when compared to the prior study. Otherwise, no significant change.  07/03/2011 MR Lumbar Spine w/o contrast - Multilevel degenerative disease appears most notable at L2-# where there is moderate central canal narrowing. Broad based disc bulge eccentric to the left contacts and posteriourly deflects the exited left L2 root. Disc bulge causes narrowing of the right lateral recess at L4-5. Disc just contacts the exiting right L3 root without compressing or displacing it. 4.4 cm descending abnormal. Negative for fracture or other acute finding. 07/2011- Admit Afib CHF 09/17/2011 DG Chest 2 View - Emphysema without acute disease. 09/2011 Cardioversion  CT Abd/pelvis (Date: 11/14/12)  1. Fusiform dilatation of the infrarenal abdominal aorta with maximum axial dimension of 4.8 x 4.4 cm. This aneurysm terminates at the bifurcation.   2. Chronic right hydronephrosis appears be due to right UPJ narrowing. No calculus or mass is evident.   3. Multiple bilateral renal cysts. No definite solid renal lesion is seen.   4. Left inguinal hernia containing fat.   5. Degenerative  disc disease of the lumbar spine as noted above.   Physical Exam:  Filed Vitals:   02/21/13 1448  BP: 124/64  Pulse: 60  Temp: 94.3 F (34.6 C)  Resp: 16  Height: 5' 10.5" (1.791 m)  Weight: 160 lb 6.4 oz (72.757 kg)     General: thin elderly male in no acute distress Head:atraumatic, normocephalic, no pallor or icterus Ear/Nose/Throat: Hearing grossly intact, oropharynx w/o Erythema/Exudate Eyes: PERRLA, EOMI Neck: Supple, no nuchal rigidity, no palpable LAD Pulmonary: CTAB, no rales, rhonchi, & wheezing Cardiac:  RRR, Nl S1, S2, no Murmurs, rubs or gallops Gastrointestinal: soft, non tender, no distension, no masses Musculoskeletal: strength 5/5, normal reflexes Neurologic: CN 2-12 intact , no focal deficit Psychiatric: Judgment intact, Mood & affect appropriate for pt's clinical situation  Labs reviewed:  06/29/2011 BMP; Glucose 88, BUN 16, Creatinine 1.17 CBC; WBC 5.7, RBC 3.83, HGB 12.9 11/16/2011  PSA: 1.0 03/14/2012 BMP; Glucose 127, BUN 19, Creatinine 1.06 CBC; WBC 7.1, RBC 3.63, HGB 12.0 07/11/2012 BMP Glucose 96 Bun 16 Creatinine 1.11 CBC Wbc 6.5 Rbc 3.46 Hemoglobin 11.0 TSH 10.700  Assessment/Plan  Copd- continue current regimen. No medication changes made. To complete tapering course of prednisone  htn- bp well controlled. Continue current regimen. Check cmp prior to next visit  Hyperlipidemia-continue current regimen of statin and niacin. Monitor and check flp prior to next visit  Anxiety- continue current regimen of alprazolam  afib- rate controlled. Continue warfarin and digoxin and follow with cardiology  Hypothyroidism- continue levothyroxine, symptom under control. Check tsh   Labs/tests ordered- tsh, lipid panel, cmp, cbc

## 2013-02-23 ENCOUNTER — Other Ambulatory Visit: Payer: Self-pay | Admitting: Pulmonary Disease

## 2013-04-03 ENCOUNTER — Telehealth: Payer: Self-pay | Admitting: Pulmonary Disease

## 2013-04-03 DIAGNOSIS — J8410 Pulmonary fibrosis, unspecified: Secondary | ICD-10-CM

## 2013-04-03 DIAGNOSIS — J449 Chronic obstructive pulmonary disease, unspecified: Secondary | ICD-10-CM

## 2013-04-03 NOTE — Telephone Encounter (Signed)
I spoke with renee. She stated pt neb machine broke over the weekend. It wont do anything at all. Needs new order sent to [facility]. i have done so. Nothing further was needed

## 2013-04-04 ENCOUNTER — Other Ambulatory Visit: Payer: Self-pay | Admitting: Pulmonary Disease

## 2013-05-17 ENCOUNTER — Ambulatory Visit (INDEPENDENT_AMBULATORY_CARE_PROVIDER_SITE_OTHER): Payer: Medicare Other | Admitting: Internal Medicine

## 2013-05-17 ENCOUNTER — Encounter: Payer: Self-pay | Admitting: Internal Medicine

## 2013-05-17 VITALS — BP 144/76 | HR 84 | Temp 96.1°F | Wt 158.8 lb

## 2013-05-17 DIAGNOSIS — I4891 Unspecified atrial fibrillation: Secondary | ICD-10-CM

## 2013-05-17 DIAGNOSIS — E039 Hypothyroidism, unspecified: Secondary | ICD-10-CM

## 2013-05-17 DIAGNOSIS — K219 Gastro-esophageal reflux disease without esophagitis: Secondary | ICD-10-CM

## 2013-05-17 DIAGNOSIS — I119 Hypertensive heart disease without heart failure: Secondary | ICD-10-CM

## 2013-05-17 DIAGNOSIS — Z23 Encounter for immunization: Secondary | ICD-10-CM

## 2013-05-17 DIAGNOSIS — J449 Chronic obstructive pulmonary disease, unspecified: Secondary | ICD-10-CM

## 2013-05-17 DIAGNOSIS — J4489 Other specified chronic obstructive pulmonary disease: Secondary | ICD-10-CM

## 2013-05-17 MED ORDER — NITROGLYCERIN 0.4 MG SL SUBL: 0.4000 mg | SUBLINGUAL_TABLET | SUBLINGUAL | Status: DC | PRN

## 2013-05-17 MED ORDER — PANTOPRAZOLE SODIUM 20 MG PO TBEC: 20.0000 mg | DELAYED_RELEASE_TABLET | Freq: Every day | ORAL | Status: DC

## 2013-05-17 MED ORDER — ALBUTEROL SULFATE HFA 108 (90 BASE) MCG/ACT IN AERS: INHALATION_SPRAY | RESPIRATORY_TRACT | Status: DC

## 2013-05-17 NOTE — Progress Notes (Signed)
Patient ID: Travis Bond, male   DOB: September 20, 1926, 79 y.o.   MRN: [id number]  Chief Complaint  Patient presents with  . Medical Managment of Chronic Issues    3 month follow-up, renew medications   . Cough    taking OTC Mucinex, not helping   No Known Allergies  HPI:   79 y/o male patient is here for routine follow up. He complaints of cough and feels mucus to be stuck in his throat, mainly in the morning. He has cough in the morning ocassionally with yellow phlegm.This has happened to him before as well. But the mucus in the throat is new. denies any runny nose or sore throat. No shortness of breath. No chest congestion. He has been using his breathing treatment. He follows with cardiology  Review of Systems:  No fever or chills Appetite is good No chest pain or palpitations No nausea or vomiting, no abdominal pain Has regular bowel movement No urinary complaints No dizziness or lightheadedness No limb weakness  Physical exam  BP 144/76  Pulse 84  Temp(Src) 96.1 F (35.6 C) (Oral)  Wt 158 lb 12.8 oz (72.031 kg)  BMI 22.46 kg/m2  SpO2 93%  General: thin elderly male in no acute distress Head:atraumatic, normocephalic, no pallor or icterus Ear/Nose/Throat: Hearing grossly intact, oropharynx without Erythema/Exudate Eyes: PERRLA, EOMI Neck: Supple, no nuchal rigidity, no palpable LAD Pulmonary: CTAB, no rales, rhonchi, & wheezing Cardiac: RRR, Normal S1, S2, no Murmurs, rubs or gallops Gastrointestinal: soft, non tender, no distension, no masses Musculoskeletal: strength 5/5, normal reflexes Neurologic: Cranial nerve 2-12 intact , no focal deficit Psychiatric: Judgment intact, Mood & affect appropriate  Labs reviewed:  06/29/2011 BMP; Glucose 88, BUN 16, Creatinine 1.17 CBC; WBC 5.7, RBC 3.83, HGB 12.9 11/16/2011  PSA: 1.0 03/14/2012 BMP; Glucose 127, BUN 19, Creatinine 1.06 CBC; WBC 7.1, RBC 3.63, HGB 12.0 07/11/2012 BMP Glucose 96 Bun 16 Creatinine 1.11 CBC Wbc  6.5 Rbc 3.46 Hemoglobin 11.0 TSH 10.700   Assessment/Plan  Copd- breathing is stable. Normal lung exam. continue current regimen. No medication changes made. Education provided regaring using ipratropium 3 times a day. Influenza vaccine provided  GERD- the mucus at back of throat raises concerns for reflux vs post nasal drip. Will treat him empirically with protonix 20 mg daily for now and reassess  htn- bp well controlled. Continue current regimen of imdur and lasix. Check bmp today  Hyperlipidemia-continue current regimen of statin and niacin.   Anxiety- continue current regimen of alprazolam  afib- rate controlled. Continue warfarin and digoxin and follow with cardiology  Hypothyroidism- continue levothyroxine, symptom under control. Check tsh   Peripheral neuropathy- continue gabapentin for now  CAD- remains chest pain free, continue lasix, imdur, plavix and statin

## 2013-05-18 ENCOUNTER — Telehealth: Payer: Self-pay

## 2013-05-18 LAB — COMPREHENSIVE METABOLIC PANEL
ALT: 14 IU/L (ref 0–44)
AST: 19 IU/L (ref 0–40)
Albumin/Globulin Ratio: 1.4 (ref 1.1–2.5)
Alkaline Phosphatase: 87 IU/L (ref 39–117)
BUN/Creatinine Ratio: 13 (ref 10–22)
Chloride: 98 mmol/L (ref 97–108)
GFR calc Af Amer: 79 mL/min/{1.73_m2} (ref >59)
Glucose: 95 mg/dL (ref 65–99)
Potassium: 4.2 mmol/L (ref 3.5–5.2)
Sodium: 140 mmol/L (ref 134–144)
Total Bilirubin: 0.5 mg/dL (ref 0.0–1.2)
Total Protein: 7 g/dL (ref 6.0–8.5)

## 2013-05-18 LAB — CBC WITH DIFFERENTIAL/PLATELET
Basophils Absolute: 0 10*3/uL (ref 0.0–0.2)
Eosinophils Absolute: 0.2 10*3/uL (ref 0.0–0.4)
Hemoglobin: 11.4 g/dL — ABNORMAL LOW (ref 12.6–17.7)
Lymphs: 37 %
MCH: 32.5 pg (ref 26.6–33.0)
MCHC: 32.7 g/dL (ref 31.5–35.7)
MCV: 99 fL — ABNORMAL HIGH (ref 79–97)
Monocytes Absolute: 0.6 10*3/uL (ref 0.1–0.9)
Neutrophils Absolute: 2.8 10*3/uL (ref 1.4–7.0)

## 2013-05-18 LAB — TSH: TSH: 7.48 u[IU]/mL — ABNORMAL HIGH (ref 0.450–4.500)

## 2013-05-18 MED ORDER — LEVOTHYROXINE SODIUM 75 MCG PO TABS: 75.0000 ug | ORAL_TABLET | Freq: Every day | ORAL | Status: DC

## 2013-05-18 NOTE — Telephone Encounter (Signed)
Message copied by Maurice Small on Thu May 18, 2013  3:45 PM ------      Message from: Oneal Grout      Created: Thu May 18, 2013  9:30 AM       Also will recheck your thyroid level in 3 months. Add thyroid panel in 3 months. thanks ------

## 2013-05-18 NOTE — Telephone Encounter (Signed)
Your thyroid level is high suggesting that we wll need to adjust your thyroid medication. Increase levothyroxine to 75 mcg daily for now. Please send new script to pharmacy. You have anemia- Will encourage you to eat iron rich food. The low blood count is likely because of your chronic kidney problem. It is important to keep your blood pressure and sugar under control.    Discussed with patient, patient verbalized understanding. Scheduled lab appointment for 08/10/13 to recheck thyroid. Copy of labs mailed to patient.

## 2013-05-19 ENCOUNTER — Other Ambulatory Visit: Payer: Self-pay | Admitting: *Deleted

## 2013-05-19 DIAGNOSIS — Z23 Encounter for immunization: Secondary | ICD-10-CM | POA: Insufficient documentation

## 2013-05-19 MED ORDER — LEVOTHYROXINE SODIUM 75 MCG PO TABS: 75.0000 ug | ORAL_TABLET | Freq: Every day | ORAL | Status: DC

## 2013-06-01 ENCOUNTER — Encounter: Payer: Self-pay | Admitting: Family

## 2013-06-02 ENCOUNTER — Encounter: Payer: Self-pay | Admitting: Family

## 2013-06-02 ENCOUNTER — Ambulatory Visit (INDEPENDENT_AMBULATORY_CARE_PROVIDER_SITE_OTHER)
Admission: RE | Admit: 2013-06-02 | Discharge: 2013-06-02 | Disposition: A | Discharge status: Home / Self Care | Payer: Medicare Other | Source: Ambulatory Visit | Attending: Vascular Surgery | Admitting: Vascular Surgery

## 2013-06-02 ENCOUNTER — Ambulatory Visit (INDEPENDENT_AMBULATORY_CARE_PROVIDER_SITE_OTHER): Payer: Medicare Other | Admitting: Family

## 2013-06-02 ENCOUNTER — Ambulatory Visit (HOSPITAL_COMMUNITY)
Admission: RE | Admit: 2013-06-02 | Discharge: 2013-06-02 | Disposition: A | Discharge status: Home / Self Care | Payer: Medicare Other | Source: Ambulatory Visit | Attending: Vascular Surgery | Admitting: Vascular Surgery

## 2013-06-02 ENCOUNTER — Other Ambulatory Visit: Payer: Self-pay | Admitting: Vascular Surgery

## 2013-06-02 VITALS — BP 119/65 | HR 75 | Resp 16 | Ht 70.5 in | Wt 156.0 lb

## 2013-06-02 DIAGNOSIS — Z48812 Encounter for surgical aftercare following surgery on the circulatory system: Secondary | ICD-10-CM

## 2013-06-02 DIAGNOSIS — R200 Anesthesia of skin: Secondary | ICD-10-CM | POA: Insufficient documentation

## 2013-06-02 DIAGNOSIS — I70219 Atherosclerosis of native arteries of extremities with intermittent claudication, unspecified extremity: Secondary | ICD-10-CM

## 2013-06-02 DIAGNOSIS — R29898 Other symptoms and signs involving the musculoskeletal system: Secondary | ICD-10-CM

## 2013-06-02 DIAGNOSIS — I739 Peripheral vascular disease, unspecified: Secondary | ICD-10-CM

## 2013-06-02 DIAGNOSIS — R209 Unspecified disturbances of skin sensation: Secondary | ICD-10-CM

## 2013-06-02 DIAGNOSIS — I714 Abdominal aortic aneurysm, without rupture, unspecified: Secondary | ICD-10-CM | POA: Insufficient documentation

## 2013-06-02 DIAGNOSIS — M79609 Pain in unspecified limb: Secondary | ICD-10-CM | POA: Insufficient documentation

## 2013-06-02 NOTE — Patient Instructions (Addendum)
Abdominal Aortic Aneurysm  An aneurysm is the enlargement (dilatation), bulging, or ballooning out of part of the wall of a vein or artery. An aortic aneurysm is a bulging in the largest artery of the body. This artery supplies blood from the heart to the rest of the body.  The first part of the aorta is called the thoracic aorta. It leaves the heart, rises (ascends), arches, and goes down (descends) through the chest until it reaches the diaphragm. The diaphragm is the muscular part between the chest and abdomen.  The second part of the aorta is called the abdominal aorta after it has passed the diaphragm and continues down through the abdomen. The abdominal aorta ends where it splits to form the two iliac arteries that go to the legs. Aortic aneurysms can develop anywhere along the length of the aorta. The majority are located along the abdominal aorta. The major concern with an aortic aneurysm is that it can enlarge and rupture. This can cause death unless diagnosed and treated promptly. Aneurysms can also develop blood clots or infections. CAUSES  Many aortic aneurysms are caused by arteriosclerosis. Arteriosclerosis can weaken the aortic wall. The pressure of the blood being pumped through the aorta causes it to balloon out at the site of weakness. Therefore, high blood pressure (hypertension) is associated with aneurysm. Other risk factors include:  Age over 60.  Tobacco use.  Being male.  White race.  Family history of aneurysm.  Less frequent causes of abdominal aortic aneurysms include:  Connective tissue diseases.  Abdominal trauma.  Inflammation of blood vessles (arteritis).  Inherited (congenital) malformations.  Infection. SYMPTOMS  The signs and symptoms of an unruptured aneurysm will partly depend on its size and rate of growth.   Abdominal aortic aneurysms may cause pain. The pain typically has a deep quality as if it is piercing into the person. It is felt most  often in the lower back area. The pain is usually steady but may be relieved by changing your body position.  The person may also become aware of an abnormally prominent pulse in the belly (abdominal pulsation). DIAGNOSIS  An aortic aneurysm may be discovered by chance on physical exam, or on X-ray studies done for other reasons. It may be suspected because of other problems such as back or abdominal pain. The following tests may help identify the problem.  X-rays of the abdomen can show calcium deposits in the aneurysm wall.  CT scanning of the abdomen, particularly with contrast medium, is accurate at showing the exact size and shape of the aneurysm.  Ultrasounds give a clear picture of the size of an aneurysm (about 98% accuracy).  MRI scanning is accurate, but often unnecessary.  An abdominal angiogram shows the source of the major blood vessels arising from the aorta. It reveals the size and extent of any aneurysm. It can also show a clot clinging to the wall of the aneurysm (mural thrombus). TREATMENT  Treating an abdominal aortic aneurysm depends on the size. A rupture of an aneurysm is uncommon when they are less than 5 cm wide (2 inches). Rupture is far more common in aneurysms that are over 6 cm wide (2.4 inches).  Surgical repair is usually recommended for all aneurysms over 6 cm wide (2.4 inches). This depends on the health, age, and other circumstances of the individual. This type of surgery consists of opening the abdomen, removing the aneurysm, and sewing a synthetic graft (similar to a cloth tube) in its place. A   less invasive form of this surgery, using stent grafts, is sometimes recommended.  For most patients, elective repair is recommended for aneurysms between 4 and 6 cm (1.6 and 2.4 inches). Elective means the surgery can be done at your convenience. This should not be put off too long if surgery is recommended.  If you smoke, stop immediately. Smoking is a major risk  factor for enlargement and rupture.  Medications may be used to help decrease complications  these include medicine to lower blood pressure and control cholesterol. HOME CARE INSTRUCTIONS   If you smoke, stop. Do not start smoking.  Take all medications as prescribed.  Your caregiver will tell you when to have your aneurysm rechecked, either by ultrasound or CT scan.  If your caregiver has given you a follow-up appointment, it is very important to keep that appointment. Not keeping the appointment could result in a chronic or permanent injury, pain, or disability. If there is any problem keeping the appointment, you must call back to this facility for assistance. SEEK MEDICAL CARE IF:   You develop mild abdominal pain or pressure.  You are able to feel or perceive your aneurysm, and you sense any change. SEEK IMMEDIATE MEDICAL CARE IF:   You develop severe abdominal pain, or severe pain moving (radiating) to your back.  You suddenly develop cold or blue toes or feet.  You suddenly develop lightheadedness or fainting spells. MAKE SURE YOU:   Understand these instructions.  Will watch your condition.  Will get help right away if you are not doing well or get worse. Document Released: 05/27/2005 Document Revised: 11/09/2011 Document Reviewed: 03/20/2008 [facility] Patient Information 2014 [place], [place].   Stroke Prevention Some medical conditions and behaviors are associated with an increased chance of having a stroke. You may prevent a stroke by making healthy choices and managing medical conditions. Reduce your risk of having a stroke by: Staying physically active. Get at least 30 minutes of activity on most or all days. Not smoking. It may also be helpful to avoid exposure to secondhand smoke. Limiting alcohol use. Moderate alcohol use is considered to be: No more than 2 drinks per day for men. No more than 1 drink per day for nonpregnant women. Eating healthy  foods. Include 5 or more servings of fruits and vegetables a day. Certain diets may be prescribed to address high blood pressure, high cholesterol, diabetes, or obesity. Managing your cholesterol levels. A low-saturated fat, low-trans fat, low-cholesterol, and high-fiber diet may control cholesterol levels. Take any prescribed medicines to control cholesterol as directed by your caregiver. Managing your diabetes. A controlled-carbohydrate, controlled-sugar diet is recommended to manage diabetes. Take any prescribed medicines to control diabetes as directed by your caregiver. Controlling your high blood pressure (hypertension). A low-salt (sodium), low-saturated fat, low-trans fat, and low-cholesterol diet is recommended to manage high blood pressure. Take any prescribed medicines to control hypertension as directed by your caregiver. Maintaining a healthy weight. A reduced-calorie, low-sodium, low-saturated fat, low-trans fat, low-cholesterol diet is recommended to manage weight. Stopping drug abuse. Avoiding birth control pills. Talk to your caregiver about the risks of taking birth control pills if you are over 54 years old, smoke, get migraines, or have ever had a blood clot. Getting evaluated for sleep disorders (sleep apnea). Talk to your caregiver about getting a sleep evaluation if you snore a lot or have excessive sleepiness. Taking medicines as directed by your caregiver. For some people, aspirin or blood thinners (anticoagulants) are helpful in reducing the  risk of forming abnormal blood clots that can lead to stroke. If you have the irregular heart rhythm of atrial fibrillation, you should be on a blood thinner unless there is a good reason you cannot take them. Understand all your medicine instructions. SEEK IMMEDIATE MEDICAL CARE IF:  You have sudden weakness or numbness of the face, arm, or leg, especially on one side of the body. You have sudden confusion. You have trouble  speaking (aphasia) or understanding. You have sudden trouble seeing in one or both eyes. You have sudden trouble walking. You have dizziness. You have a loss of balance or coordination. You have a sudden, severe headache with no known cause. You have new chest pain or an irregular heartbeat. Any of these symptoms may represent a serious problem that is an emergency. Do not wait to see if the symptoms will go away. Get medical help right away. Call your local emergency services (911 in [place] Do not drive yourself to the hospital. Document Released: 09/24/2004 Document Revised: 11/09/2011 Document Reviewed: 04/06/2011 [facility] Patient Information 2014 [place], [place].   Peripheral Vascular Disease Peripheral Vascular Disease (PVD), also called Peripheral Arterial Disease (PAD), is a circulation problem caused by cholesterol (atherosclerotic plaque) deposits in the arteries. PVD commonly occurs in the lower extremities (legs) but it can occur in other areas of the body, such as your arms. The cholesterol buildup in the arteries reduces blood flow which can cause pain and other serious problems. The presence of PVD can place a person at risk for Coronary Artery Disease (CAD).  CAUSES  Causes of PVD can be many. It is usually associated with more than one risk factor such as:   High Cholesterol.  Smoking.  Diabetes.  Lack of exercise or inactivity.  High blood pressure (hypertension).  Obesity.  Family history. SYMPTOMS   When the lower extremities are affected, patients with PVD may experience:  Leg pain with exertion or physical activity. This is called INTERMITTENT CLAUDICATION. This may present as cramping or numbness with physical activity. The location of the pain is associated with the level of blockage. For example, blockage at the abdominal level (distal abdominal aorta) may result in buttock or hip pain. Lower leg arterial blockage may result in calf pain.  As PVD becomes  more severe, pain can develop with less physical activity.  In people with severe PVD, leg pain may occur at rest.  Other PVD signs and symptoms:  Leg numbness or weakness.  Coldness in the affected leg or foot, especially when compared to the other leg.  A change in leg color.  Patients with significant PVD are more prone to ulcers or sores on toes, feet or legs. These may take longer to heal or may reoccur. The ulcers or sores can become infected.  If signs and symptoms of PVD are ignored, gangrene may occur. This can result in the loss of toes or loss of an entire limb.  Not all leg pain is related to PVD. Other medical conditions can cause leg pain such as:  Blood clots (embolism) or Deep Vein Thrombosis.  Inflammation of the blood vessels (vasculitis).  Spinal stenosis. DIAGNOSIS  Diagnosis of PVD can involve several different types of tests. These can include:  Pulse Volume Recording Method (PVR). This test is simple, painless and does not involve the use of X-rays. PVR involves measuring and comparing the blood pressure in the arms and legs. An ABI (Ankle-Brachial Index) is calculated. The normal ratio of blood pressures is 1.  As this number becomes smaller, it indicates more severe disease.  < 0.95  indicates significant narrowing in one or more leg vessels.  <0.8 there will usually be pain in the foot, leg or buttock with exercise.  <0.4 will usually have pain in the legs at rest.  <0.25  usually indicates limb threatening PVD.  Doppler detection of pulses in the legs. This test is painless and checks to see if you have a pulses in your legs/feet.  A dye or contrast material (a substance that highlights the blood vessels so they show up on x-ray) may be given to help your caregiver better see the arteries for the following tests. The dye is eliminated from your body by the kidney's. Your caregiver may order blood work to check your kidney function and other laboratory  values before the following tests are performed:  Magnetic Resonance Angiography (MRA). An MRA is a picture study of the blood vessels and arteries. The MRA machine uses a large magnet to produce images of the blood vessels.  Computed Tomography Angiography (CTA). A CTA is a specialized x-ray that looks at how the blood flows in your blood vessels. An IV may be inserted into your arm so contrast dye can be injected.  Angiogram. Is a procedure that uses x-rays to look at your blood vessels. This procedure is minimally invasive, meaning a small incision (cut) is made in your groin. A small tube (catheter) is then inserted into the artery of your groin. The catheter is guided to the blood vessel or artery your caregiver wants to examine. Contrast dye is injected into the catheter. X-rays are then taken of the blood vessel or artery. After the images are obtained, the catheter is taken out. TREATMENT  Treatment of PVD involves many interventions which may include:  Lifestyle changes:  Quitting smoking.  Exercise.  Following a low fat, low cholesterol diet.  Control of diabetes.  Foot care is very important to the PVD patient. Good foot care can help prevent infection.  Medication:  Cholesterol-lowering medicine.  Blood pressure medicine.  Anti-platelet drugs.  Certain medicines may reduce symptoms of Intermittent Claudication.  Interventional/Surgical options:  Angioplasty. An Angioplasty is a procedure that inflates a balloon in the blocked artery. This opens the blocked artery to improve blood flow.  Stent Implant. A wire mesh tube (stent) is placed in the artery. The stent expands and stays in place, allowing the artery to remain open.  Peripheral Bypass Surgery. This is a surgical procedure that reroutes the blood around a blocked artery to help improve blood flow. This type of procedure may be performed if Angioplasty or stent implants are not an option. SEEK IMMEDIATE MEDICAL  CARE IF:   You develop pain or numbness in your arms or legs.  Your arm or leg turns cold, becomes blue in color.  You develop redness, warmth, swelling and pain in your arms or legs. MAKE SURE YOU:   Understand these instructions.  Will watch your condition.  Will get help right away if you are not doing well or get worse. Document Released: 09/24/2004 Document Revised: 11/09/2011 Document Reviewed: 08/21/2008 [facility] Patient Information 2014 [place], [place].

## 2013-06-02 NOTE — Progress Notes (Signed)
[facility] [place] HISTORY AND PHYSICAL   MRN : [id number]  History of Present Illness:   Travis Bond is a 79 y.o. male patient of Dr. Imogene Burn presents today for AAA surveillance. He has not had any interventions for peripheral vascular disease. First AAA Duplex on file is from 6 1/2  months ago at 4.8 x 4.4 cm. Patient states he stopped smoking about 2 years ago. He had a cardiac stent placed in 2011, denies CABG history. His cardiologist is Dr. Donnie Aho. Pt. reports severe COPD, is dyspneic with mild exertion, not at rest. He is supposed to use O2 24 hours/day, but uses only during the day as it dries out his nasal passages at night. Patient states that he started coumadin after his cardiac stent was placed, noted that he has atrial fib. He has lumbar spine problems, not sure what, but he has pain and numbness in both legs from hips to feet. Cold air worsens his low back pain. He also states that his calves and thighs are weak when he walks a few steps, he does not walk much.  Patient denies non healing ulcers on lower extremities. Patient has Negative history of TIA or stroke symptom.  The patient denies amaurosis fugax or monocular blindness.  The patient  denies facial drooping.  Pt. denies hemiplegia.  The patient denies receptive or expressive aphasia.    The patient has chronic low back but denies abdominal pain.  Pt Diabetic: No Pt smoker: former smoker, quit 2 years ago  Current Outpatient Prescriptions  Medication Sig Dispense Refill  . albuterol (PROVENTIL HFA) 108 (90 BASE) MCG/ACT inhaler Take 1-2 puffs by mouth every 4-6 hours as needed for wheezing and shortness of breath  8.5 g  5  . albuterol (PROVENTIL) (2.5 MG/3ML) 0.083% nebulizer solution USE ONE VIAL VIA NEBULIZER 4 TIMES DAILY AS NEEDED FOR WHEEZING AND SHORTNESS OF BREATH  120 mL  5  . ALPRAZolam (XANAX) 0.25 MG tablet Take 1 tablet (0.25 mg total) by mouth at bedtime as needed for sleep.   30 tablet  2  . clopidogrel (PLAVIX) 75 MG tablet Take 75 mg by mouth daily.        . digoxin (LANOXIN) 0.25 MG tablet Take 1 tablet (0.25 mg total) by mouth daily.  90 tablet  1  . furosemide (LASIX) 40 MG tablet Take 1 tablet (40 mg total) by mouth daily.  30 tablet  3  . gabapentin (NEURONTIN) 300 MG capsule Take 300 mg by mouth 2 (two) times daily.       Marland Kitchen guaiFENesin (MUCINEX) 600 MG 12 hr tablet Take 600 mg by mouth 2 (two) times daily.      Marland Kitchen ipratropium (ATROVENT) 0.02 % nebulizer solution Take 2.5 mLs (500 mcg total) by nebulization 3 (three) times daily.  225 mL  11  . isosorbide mononitrate (IMDUR) 60 MG 24 hr tablet Take 60 mg by mouth daily.        Marland Kitchen levothyroxine (SYNTHROID, LEVOTHROID) 75 MCG tablet Take 1 tablet (75 mcg total) by mouth daily before breakfast.  90 tablet  0  . niacin (NIASPAN) 500 MG CR tablet Take 500 mg by mouth at bedtime.        . nitroGLYCERIN (NITROSTAT) 0.4 MG SL tablet Place 1 tablet (0.4 mg total) under the tongue as needed. Chest pain  30 tablet  0  . pantoprazole (PROTONIX) 20 MG tablet Take 1 tablet (20 mg total) by mouth daily.  30 tablet  3  . pravastatin (PRAVACHOL) 20 MG tablet Take 20 mg by mouth. 1 BY MOUTH AT BEDTIME       . warfarin (COUMADIN) 3 MG tablet Take 3 mg by mouth daily.      . [DISCONTINUED] carvedilol (COREG) 3.125 MG tablet Take 3.125 mg by mouth 2 (two) times daily at 10 AM and 5 PM.        No current facility-administered medications for this visit.    Pt meds include:  Statin :Yes Betablocker: No ASA: No Other anticoagulants/antiplatelets: Plavix, coumadin  Past Medical History  Diagnosis Date  . COPD (chronic obstructive pulmonary disease)   . Chronic respiratory failure with hypoxia   . Pulmonary fibrosis   . Coronary artery disease   . Hypertension   . Hyperlipidemia   . Carotid stenosis   . Peripheral vascular disease   . Anemia   . Peripheral neuropathy   . B12 deficiency   . Pneumonia   . CAD (coronary  artery disease)   . Atrial fibrillation 07/14/2011  . Hypothyroidism 07/15/2011  . Myocardial infarction   . Anxiety state, unspecified   . Pain in joint, shoulder region   . Unspecified deficiency anemia   . Abdominal aneurysm without mention of rupture   . Other dyspnea and respiratory abnormality   . Aortic valve disorders   . Ulcer of other part of foot   . Cellulitis and abscess of foot, except toes   . Coronary atherosclerosis of native coronary artery   . Postinflammatory pulmonary fibrosis   . Type II or unspecified type diabetes mellitus without mention of complication, not stated as uncontrolled   . Other and unspecified hyperlipidemia   . Unspecified hereditary and idiopathic peripheral neuropathy   . Congestive heart failure, unspecified   . Microscopic hematuria     Past Surgical History  Procedure Laterality Date  . Inguinal hernia repair  1994  . Cardioversion  09/21/2011    Procedure: CARDIOVERSION;  Surgeon: Darden Palmer., MD;  Location: [facility] OR;  Service: Cardiovascular;  Laterality: N/A;  . Cardiac stent  2010 or 2011 pt not sure  . Hernia repair  1989    Social History History  Substance Use Topics  . Smoking status: Former Smoker -- 1.00 packs/day for 65 years    Types: Cigarettes    Quit date: 01/18/2010  . Smokeless tobacco: Never Used  . Alcohol Use: No    Family History Family History  Problem Relation Age of Onset  . Heart failure Father   . Heart failure Mother   . Coronary artery disease Brother     No Known Allergies   REVIEW OF SYSTEMS  General: [ ]  Weight loss, [ ]  Fever, [ ]  chills Neurologic: [ ]  Dizziness, [ ]  Blackouts, [ ]  Seizure [ ]  Stroke, [ ]  "Mini stroke", [ ]  Slurred speech, [ ]  Temporary blindness; [ ]  weakness in arms or legs, [ ]  Hoarseness [ ]  Dysphagia Cardiac: [ ]  Chest pain/pressure, [ ]  Shortness of breath at rest [ ]  Shortness of breath with exertion, [ ]  Atrial fibrillation or irregular heartbeat   Vascular: [ ]  Pain in legs with walking, [ ]  Pain in legs at rest, [ ]  Pain in legs at night,  [ ]  Non-healing ulcer, [ ]  Blood clot in vein/DVT,   Pulmonary: [ ]  Home oxygen, [ ]  Productive cough, [ ]  Coughing up blood, [ ]  Asthma,  [ ]  Wheezing [ ]  COPD Musculoskeletal:  [ ]  Arthritis, [ ]   Low back pain, [ ]  Joint pain Hematologic: [ ]  Easy Bruising, [ ]  Anemia; [ ]  Hepatitis Gastrointestinal: [ ]  Blood in stool, [ ]  Gastroesophageal Reflux/heartburn, Urinary: [ ]  chronic Kidney disease, [ ]  on HD - [ ]  MWF or [ ]  TTHS, [ ]  Burning with urination, [ ]  Difficulty urinating Skin: [ ]  Rashes, [ ]  Wounds Psychological: [ ]  Anxiety, [ ]  Depression  Physical Examination Filed Vitals:   06/02/13 1014  BP: 119/65  Pulse: 75  Resp: 16   Filed Weights   06/02/13 1014  Weight: 156 lb (70.761 kg)   Body mass index is 22.06 kg/(m^2).  General:  WDWN Gait: Slow, deliberate. HENT: WNL Eyes: Pupils equal Pulmonary: normal non-labored breathing , without Rales, rhonchi,  wheezing Cardiac: Irregular rate and rhythm.  Abdomen: soft, NT, no masses Skin: no rashes, ulcers noted;  no Gangrene , no cellulitis; no open wounds.  Vascular Exam/Pulses: VASCULAR EXAM  Carotid Bruits Left Right   Negative Positive, or transmitted and magnified cardiac murmur    Aorta is palpable. Bilateral radial pulses are 2+ palpable.                         VASCULAR EXAM: Extremities without ischemic changes  without Gangrene; without open wounds.                                                                                                          LE Pulses LEFT RIGHT       FEMORAL   palpable  not palpable        POPLITEAL  not palpable   not palpable       POSTERIOR TIBIAL  not palpable   not palpable        DORSALIS PEDIS      ANTERIOR TIBIAL  palpable  not palpable      Musculoskeletal: no muscle wasting or atrophy; no edema  Neurologic: A&O X 3; Appropriate Affect ;  SENSATION:  normal; MOTOR FUNCTION: 5/5 Symmetric, CN 2-12 intact Speech is fluent/normal   Significant Diagnostic Studies: 11/14/12 CTA: 1. Fusiform dilatation of the infrarenal abdominal aorta with  maximum axial dimension of 4.8 x 4.4 cm. This aneurysm terminates  at the bifurcation.  2. Chronic right hydronephrosis appears be due to right UPJ  narrowing. No calculus or mass is evident.  5. Degenerative disc disease of the lumbar spine as noted above.   Non-Invasive Vascular Imaging:   AAA Duplex (06/02/2013): 5.06 x 4.85 cm with intramural thrombus present. Previous maximum aortic diameter: 4.8 x 4.4 cm by CTA as above.  Carotid Duplex on 12/10/2008: 40-59% stenosis of bilateral internal carotid arteries. Elevated velocity of 260 cm/sec noted in the left proximal subclavian artery with the antegrade left vertebral artery waveform demonstrating early systolic deceleration  ABI's (16/06/9603):  Right: 0.61 Left: 0.62 Both have monophasic waveforms and both indicate moderate arterial occlusive disease. Previous ABI's (12/10/2008): Right: 0.76; Left: 0.71.  ASSESSMENT:  AAA increased 0.26 cm in 6 1/2 months with maximum diameter  today at 5.06 cm. The status of his comorbidities puts him at higher risk for complications with any intervention.    He has moderate arterial occlusive disease in both lower extremities with no ischemic changes in his feet or legs, slightly worse than 4 years previous. He has no stroke or TIA history and his last carotid Duplex on 12/10/2008 indicates low to moderate bilateral internal carotid artery stenosis.   PLAN:   Based on today's exam and ABI's, and after discussing with Dr. Imogene Burn, advised patient to return for AAA Duplex and bilateral carotid Duplex in 6 months and follow up with Dr. Imogene Burn.  I discussed in depth with the patient the nature of atherosclerosis, and emphasized the importance of maximal medical management including strict control of blood  pressure, blood glucose, and lipid levels, obtaining regular exercise, and continued cessation of smoking.  The patient is aware that without maximal medical management the underlying atherosclerotic disease process will progress, limiting the benefit of any interventions. The patient was given information about stroke prevention and what symptoms should prompt the patient to seek immediate medical care. The patient was given information about AAA including signs, symptoms, treatment,  what symptoms should prompt the patient to seek immediate medical care, and how to minimize the risk of enlargement and rupture of aneurysms. The patient was given information about PAD including signs, symptoms, treatment, what symptoms should prompt the patient to seek immediate medical care, and risk reduction measures to take.  Charisse March, RN, MSN, FNP-C [facility] Office: [phone]  Clinic MD: Imogene Burn 06/02/2013 9:20 AM

## 2013-06-08 ENCOUNTER — Ambulatory Visit (INDEPENDENT_AMBULATORY_CARE_PROVIDER_SITE_OTHER): Payer: Medicare Other | Admitting: Pulmonary Disease

## 2013-06-08 ENCOUNTER — Encounter: Payer: Self-pay | Admitting: Pulmonary Disease

## 2013-06-08 VITALS — BP 122/70 | HR 69 | Ht 70.0 in | Wt 157.0 lb

## 2013-06-08 DIAGNOSIS — J9610 Chronic respiratory failure, unspecified whether with hypoxia or hypercapnia: Secondary | ICD-10-CM

## 2013-06-08 DIAGNOSIS — J4489 Other specified chronic obstructive pulmonary disease: Secondary | ICD-10-CM

## 2013-06-08 DIAGNOSIS — J8410 Pulmonary fibrosis, unspecified: Secondary | ICD-10-CM

## 2013-06-08 DIAGNOSIS — J449 Chronic obstructive pulmonary disease, unspecified: Secondary | ICD-10-CM

## 2013-06-08 NOTE — Assessment & Plan Note (Signed)
He is to continue prn albuterol/ipratropium.  Discussed addition of pulmicort to his regimen >> he feels his symptoms are stable, and would like to defer changes to his regimen.

## 2013-06-08 NOTE — Patient Instructions (Signed)
Follow up in 6 months 

## 2013-06-08 NOTE — Assessment & Plan Note (Signed)
Advised that he needs to use oxygen 24/7, including when he is asleep.

## 2013-06-08 NOTE — Assessment & Plan Note (Signed)
Stable.  Monitor clinically.  Continue oxygen with exertion and sleep.

## 2013-06-08 NOTE — Progress Notes (Signed)
Chief Complaint  Patient presents with  . COPD    Breathing has good days and bad days. Reports DOE. Denies coughing, chest tightness.    CC: Travis Bond   History of Present Illness: Travis Bond is a 79 y.o. male former smoker with COPD, Pulmonary fibrosis, and hypoxemia.  His breathing has been stable.  He has occasional cough with clear to yellow/green sputum.  He denies hemoptysis.  He is not having leg swelling.  He has trouble with activity, but this is due mostly to back pain.  He denies fever or skin rashes.  He uses his nebulizer 3 to 4 times per day.  He is using oxygen during the day, but does not use it at night.  TESTS: Spirometry 12/05/09 >> FEV1 1.20(47%), FVC 2.56(64%), FEV1% 46  Travis Bond  has a past medical history of COPD (chronic obstructive pulmonary disease); Chronic respiratory failure with hypoxia; Pulmonary fibrosis; Coronary artery disease; Hypertension; Hyperlipidemia; Carotid stenosis; Peripheral vascular disease; Anemia; Peripheral neuropathy; B12 deficiency; Pneumonia; CAD (coronary artery disease); Atrial fibrillation (07/14/2011); Hypothyroidism (07/15/2011); Myocardial infarction; Anxiety state, unspecified; Pain in joint, shoulder region; Unspecified deficiency anemia; Abdominal aneurysm without mention of rupture; Other dyspnea and respiratory abnormality; Aortic valve disorders; Ulcer of other part of foot; Cellulitis and abscess of foot, except toes; Coronary atherosclerosis of native coronary artery; Postinflammatory pulmonary fibrosis; Type II or unspecified type diabetes mellitus without mention of complication, not stated as uncontrolled; Other and unspecified hyperlipidemia; Unspecified hereditary and idiopathic peripheral neuropathy; Congestive heart failure, unspecified; Microscopic hematuria; and Chronic kidney disease.  Travis Bond  has past surgical history that includes Inguinal hernia repair (1994); Cardioversion (09/21/2011);  cardiac stent (2010 or 2011 pt not sure); and Hernia repair (1989).  Prior to Admission medications   Medication Sig Start Date End Date Taking? Authorizing Provider  albuterol (PROVENTIL HFA;VENTOLIN HFA) 108 (90 BASE) MCG/ACT inhaler One to two puffs by mouth every four to six hours as needed for wheezing and Shortness of breath 11/24/11  Yes Coralyn Helling, MD  albuterol (PROVENTIL) (2.5 MG/3ML) 0.083% nebulizer solution Take 3 mLs (2.5 mg total) by nebulization 4 (four) times daily as needed for wheezing. Shortness of breath 02/15/12  Yes Coralyn Helling, MD  ALPRAZolam Prudy Feeler) 0.25 MG tablet Take 1 tablet (0.25 mg total) by mouth at bedtime as needed for sleep. sleep 11/02/12  Yes Coralyn Helling, MD  clopidogrel (PLAVIX) 75 MG tablet Take 75 mg by mouth daily.     Yes Historical Provider, MD  digoxin (LANOXIN) 0.25 MG tablet Take 1 tablet (0.25 mg total) by mouth daily. 07/20/12 07/20/13 Yes Coralyn Helling, MD  gabapentin (NEURONTIN) 300 MG capsule Take 300 mg by mouth 2 (two) times daily.    Yes Historical Provider, MD  guaiFENesin (MUCINEX) 600 MG 12 hr tablet Take 600 mg by mouth 2 (two) times daily.   Yes Historical Provider, MD  ipratropium (ATROVENT) 0.02 % nebulizer solution Take 2.5 mLs (500 mcg total) by nebulization 2 (two) times daily. 02/15/12  Yes Coralyn Helling, MD  isosorbide mononitrate (IMDUR) 60 MG 24 hr tablet Take 60 mg by mouth daily.     Yes Historical Provider, MD  magnesium oxide (MAG-OX) 400 MG tablet Take 400 mg by mouth daily.   Yes Historical Provider, MD  niacin (NIASPAN) 500 MG CR tablet Take 500 mg by mouth at bedtime.     Yes Historical Provider, MD  nitroGLYCERIN (NITROSTAT) 0.4 MG SL tablet Place 0.4 mg under the tongue as needed. Chest pain  Yes Historical Provider, MD  pravastatin (PRAVACHOL) 20 MG tablet Take 20 mg by mouth. 1 BY MOUTH AT BEDTIME    Yes Historical Provider, MD  furosemide (LASIX) 40 MG tablet Take 40 mg by mouth daily. 07/22/11 07/21/12  Othella Boyer, MD   levothyroxine (SYNTHROID, LEVOTHROID) 25 MCG tablet Take 25 mcg by mouth daily before breakfast. 07/22/11 07/21/12  Othella Boyer, MD  warfarin (COUMADIN) 4 MG tablet Take 4 mg by mouth daily with supper. 07/22/11 07/21/12  Othella Boyer, MD    No Known Allergies   Physical Exam:  General - No distress, wearing oxygen ENT - No sinus tenderness, clear nasal discharge, no oral exudate, no LAN Cardiac - s1s2 regular, no murmur Chest - Decreased breath sounds, no wheeze/rales/dullness Back - No focal tenderness Abd - Soft, non-tender Ext - No edema Neuro - Normal strength Skin - No rashes Psych - normal mood, and behavior   Assessment/Plan:  Coralyn Helling, MD [facility] Pager:  [phone] 06/08/2013, 12:12 PM

## 2013-08-10 ENCOUNTER — Other Ambulatory Visit: Payer: Medicare Other

## 2013-08-10 DIAGNOSIS — E039 Hypothyroidism, unspecified: Secondary | ICD-10-CM

## 2013-08-15 ENCOUNTER — Ambulatory Visit (INDEPENDENT_AMBULATORY_CARE_PROVIDER_SITE_OTHER): Payer: Medicare Other | Admitting: Internal Medicine

## 2013-08-15 ENCOUNTER — Encounter: Payer: Self-pay | Admitting: Internal Medicine

## 2013-08-15 VITALS — BP 136/58 | HR 74 | Temp 97.3°F | Wt 156.0 lb

## 2013-08-15 DIAGNOSIS — F419 Anxiety disorder, unspecified: Secondary | ICD-10-CM

## 2013-08-15 DIAGNOSIS — E039 Hypothyroidism, unspecified: Secondary | ICD-10-CM

## 2013-08-15 DIAGNOSIS — J9610 Chronic respiratory failure, unspecified whether with hypoxia or hypercapnia: Secondary | ICD-10-CM

## 2013-08-15 DIAGNOSIS — F411 Generalized anxiety disorder: Secondary | ICD-10-CM

## 2013-08-15 DIAGNOSIS — G629 Polyneuropathy, unspecified: Secondary | ICD-10-CM

## 2013-08-15 DIAGNOSIS — K219 Gastro-esophageal reflux disease without esophagitis: Secondary | ICD-10-CM

## 2013-08-15 DIAGNOSIS — G8929 Other chronic pain: Secondary | ICD-10-CM | POA: Insufficient documentation

## 2013-08-15 DIAGNOSIS — Z23 Encounter for immunization: Secondary | ICD-10-CM

## 2013-08-15 DIAGNOSIS — G609 Hereditary and idiopathic neuropathy, unspecified: Secondary | ICD-10-CM

## 2013-08-15 MED ORDER — CALCIUM CARBONATE-VITAMIN D 500-200 MG-UNIT PO TABS: 1.0000 | ORAL_TABLET | Freq: Two times a day (BID) | ORAL | Status: DC

## 2013-08-15 MED ORDER — TETANUS-DIPHTH-ACELL PERTUSSIS 5-2.5-18.5 LF-MCG/0.5 IM SUSP: 0.5000 mL | Freq: Once | INTRAMUSCULAR | Status: DC

## 2013-08-15 NOTE — Progress Notes (Signed)
Patient ID: Travis Bond, male   DOB: [birth year], 79 y.o.   MRN: [id number]  Chief Complaint  Patient presents with  . Medical Managment of Chronic Issues    3 month f/u with labs printed.  . Immunizations    needs Tdap   No Known Allergies  HPI:   79 y/o male patient is here for routine follow up. He denies any complaint this visit  Review of Systems  Constitutional: Negative for fever, chills, weight loss, malaise/fatigue and diaphoresis.  HENT: Negative for congestion, hearing loss and sore throat.   Eyes: Negative for blurred vision, double vision and discharge.  Respiratory: Negative for cough, sputum production, shortness of breath and wheezing.   Cardiovascular: Negative for chest pain, palpitations, orthopnea and leg swelling.  Gastrointestinal: Negative for heartburn, nausea, vomiting, abdominal pain, diarrhea and constipation.  Genitourinary: Negative for dysuria, urgency, frequency and flank pain.  Musculoskeletal: Negative for back pain, falls, joint pain and myalgias.  Skin: Negative for itching and rash.  Neurological: Negative for dizziness, tingling, focal weakness and headaches.  Psychiatric/Behavioral: Negative for depression and memory loss. The patient is not nervous/anxious.    Past Medical History  Diagnosis Date  . COPD (chronic obstructive pulmonary disease)   . Chronic respiratory failure with hypoxia   . Pulmonary fibrosis   . Coronary artery disease   . Hypertension   . Hyperlipidemia   . Carotid stenosis   . Peripheral vascular disease   . Anemia   . Peripheral neuropathy   . B12 deficiency   . Pneumonia   . CAD (coronary artery disease)   . Atrial fibrillation 07/14/2011  . Hypothyroidism 07/15/2011  . Myocardial infarction   . Anxiety state, unspecified   . Pain in joint, shoulder region   . Unspecified deficiency anemia   . Abdominal aneurysm without mention of rupture   . Other dyspnea and respiratory abnormality   . Aortic valve  disorders   . Ulcer of other part of foot   . Cellulitis and abscess of foot, except toes   . Coronary atherosclerosis of native coronary artery   . Postinflammatory pulmonary fibrosis   . Type II or unspecified type diabetes mellitus without mention of complication, not stated as uncontrolled   . Other and unspecified hyperlipidemia   . Unspecified hereditary and idiopathic peripheral neuropathy   . Congestive heart failure, unspecified   . Microscopic hematuria   . Chronic kidney disease     Kidney stones   Medication reviewed. See [facility]  Physical exam BP 136/58  Pulse 74  Temp(Src) 97.3 F (36.3 C) (Oral)  Wt 156 lb (70.761 kg)  SpO2 94%  General: thin elderly male in no acute distress Head:atraumatic, normocephalic, no pallor or icterus Ear/Nose/Throat: Hearing grossly intact, oropharynx without Erythema/Exudate Eyes: PERRLA, EOMI Neck: Supple, no nuchal rigidity, no palpable LAD Pulmonary: CTAB, no rales, rhonchi, & wheezing Cardiac: RRR, Normal S1, S2, no Murmurs, rubs or gallops Gastrointestinal: soft, non tender, no distension, no masses Musculoskeletal: strength 5/5, normal reflexes Neurologic: Cranial nerve 2-12 intact , no focal deficit Psychiatric: Judgment intact, Mood & affect appropriate  Labs reviewed:  06/29/2011 BMP; Glucose 88, BUN 16, Creatinine 1.17 CBC; WBC 5.7, RBC 3.83, HGB 12.9 11/16/2011  PSA: 1.0 03/14/2012 BMP; Glucose 127, BUN 19, Creatinine 1.06 CBC; WBC 7.1, RBC 3.63, HGB 12.0 07/11/2012 BMP Glucose 96 Bun 16 Creatinine 1.11 CBC Wbc 6.5 Rbc 3.46 Hemoglobin 11.0 TSH 10.700  CBC    Component Value Date/Time   WBC 5.8 05/17/2013  1619   WBC 5.0 07/22/2011 0545   RBC 3.51* 05/17/2013 1619   RBC 3.79* 07/22/2011 0545   HGB 11.4* 05/17/2013 1619   HCT 34.9* 05/17/2013 1619   PLT 171 07/22/2011 0545   MCV 99* 05/17/2013 1619   MCH 32.5 05/17/2013 1619   MCH 34.0 07/22/2011 0545   MCHC 32.7 05/17/2013 1619   MCHC 33.2 07/22/2011 0545   RDW 16.6*  05/17/2013 1619   RDW 15.9* 07/22/2011 0545   LYMPHSABS 2.1 05/17/2013 1619   LYMPHSABS 1.6 07/14/2011 1324   MONOABS 0.5 07/14/2011 1324   EOSABS 0.2 05/17/2013 1619   EOSABS 0.2 07/14/2011 1324   BASOSABS 0.0 05/17/2013 1619   BASOSABS 0.0 07/14/2011 1324    CMP     Component Value Date/Time   NA 140 05/17/2013 1619   NA 136 07/22/2011 0545   K 4.2 05/17/2013 1619   CL 98 05/17/2013 1619   CO2 29 05/17/2013 1619   GLUCOSE 95 05/17/2013 1619   GLUCOSE 102* 07/22/2011 0545   BUN 13 05/17/2013 1619   BUN 19 07/22/2011 0545   CREATININE 1.01 05/17/2013 1619   CALCIUM 9.2 05/17/2013 1619   PROT 7.0 05/17/2013 1619   PROT 6.8 07/14/2011 1324   ALBUMIN 3.6 07/14/2011 1324   AST 19 05/17/2013 1619   ALT 14 05/17/2013 1619   ALKPHOS 87 05/17/2013 1619   BILITOT 0.5 05/17/2013 1619   GFRNONAA 68 05/17/2013 1619   GFRAA 79 05/17/2013 1619   Assessment/Plan  1. Need for prophylactic vaccination with combined diphtheria-tetanus-pertussis (DTP) vaccine - Tdap (BOOSTRIX) 5-2.5-18.5 LF-MCG/0.5 injection; Inject 0.5 mLs into the muscle once.  Dispense: 0.5 mL; Refill: 0  2. Chronic respiratory failure In setting of copd and pulmonary fibrosis, breathing is stable. Normal lung exam. continue current regimen of bronchodilators. No medication changes made.continue anxiety medications - CMP; Future  3. GERD (gastroesophageal reflux disease) Tolerating protonix well. Continue this for now  4. Hypothyroidism Continue levothyroxine - CMP; Future - TSH; Future - Lipid Panel; Future  5. Peripheral neuropathy continue gabapentin for now - CMP; Future  6. Anxiety continue current regimen of alprazolam  7. Hyperlipidemia continue current regimen of statin and niacin. Check flp

## 2013-09-01 ENCOUNTER — Other Ambulatory Visit: Payer: Self-pay | Admitting: Internal Medicine

## 2013-09-08 ENCOUNTER — Other Ambulatory Visit: Payer: Self-pay | Admitting: Pulmonary Disease

## 2013-09-08 NOTE — Telephone Encounter (Signed)
Please advise on refill. Thanks. 

## 2013-09-14 ENCOUNTER — Encounter: Payer: Self-pay | Admitting: Cardiology

## 2013-09-14 NOTE — Progress Notes (Signed)
Patient ID: Travis Bond, male   DOB: 1927/05/07, 80 y.o.   MRN: [id number]   Travis, Bond    Date of visit:  09/14/2013 DOB:  15-Mar-1927    Age:  80 yrs. Medical record number: [id number] Account number:  [id number] Primary Care Provider: Oneal Grout ____________________________ CURRENT DIAGNOSES  1. Arrhythmia-Atrial Fibrillation  2. Congestive Heart Failure Chronic Diastolic  3. Long-term (current) use of anticoagulants  4. Idiopathic Fibrosing Alveolitis  5. Hyperlipidemia  6. CAD,Native  7. Unspecified Idiopathic Peripheral Neuropathy  8. Carotid Artery Stenosis  9. Stent Placement  10. Peripheral Vascular Disease  11. Aortic Valve Disorder  12. Aneurysm-Abdominal  13. Hypothyroidism  14. COPD ____________________________ ALLERGIES  No Known Drug Allergies ____________________________ MEDICATIONS  1. Nitroglycerin 0.4 mg Tablet, Sublingual, use prn  2. Proventil HFA 90 mcg/Actuation HFA Aerosol Inhaler, PRN  3. albuterol sulfate 2.5 mg/0.5 mL Solution for Nebulization, QID  4. Mucinex 600 mg Tablet Sustained Release, BID  5. ipratropium bromide 0.06 % Spray, Non-Aerosol, PRN  6. gabapentin 300 mg Capsule, BID  7. alprazolam 0.25 mg tablet, QHS  8. digoxin 125 mcg tablet, 1 p.o. daily  9. Niaspan Extended-Release 500 mg tablet extended release 24 hr, 1 p.o. daily  10. furosemide 40 mg tablet, 1 p.o. daily  11. clopidogrel 75 mg tablet, 1 p.o. daily  12. isosorbide mononitrate 60 mg tablet extended release 24 hr, 1 p.o. daily  13. pravastatin 20 mg tablet, QHS  14. levothyroxine 75 mcg tablet, 1 p.o. daily  15. warfarin 3 mg tablet, one daily or as directed  16. carvedilol 3.125 mg tablet, BID  17. Oyster Shell + D3 250 mg-125 unit tablet, BID ____________________________ CHIEF COMPLAINTS  Followup of Arrhythmia-Atrial Fibrillation  Followup of CAD,Native  throat problem ____________________________ HISTORY OF PRESENT ILLNESS Patient seen for cardiac  followup. He is doing well from a cardiovascular viewpoint and denies angina. He is moderately dyspneic. He recently had abdominal aneurysm screening. His major complaint today is that of pills sticking in the back of his throat as well as some occasional choking on food and issues of food won't go down. He has some mild burning in his throat also but it is not exertionally related. He has had no recurrence of atrial fibrillation. He denies PND, orthopnea, syncope, or claudication ____________________________ PAST HISTORY  Past Medical Illnesses:  hypertension, hyperlipidemia, pneumonia, COPD, peripheral neuropathy, interstitial fibrosis;  Cardiovascular Illnesses:  CAD, peripheral vascular disease, mitral regurgitation, abdominal aneurysm;  Infectious Diseases:  no previous history of significant infectious diseases;  Surgical Procedures:  hernia repair;  Trauma History:  no previous history of significant trauma;  Cardiology Procedures-Invasive:  cardiac cath (left) April 2010, Xience DES stent  LAD April 2010, cardioversion January 2013;  Cardiology Procedures-Noninvasive:  regadenoson cardiolte, lexiscan cardiolite March 2011, echocardiogram August 2012;  Cardiac Cath Results:  30% stenosis proximal Left main, 90% stenosis mid LAD, no significant disease CFX, occluded RCA, left to right collateral RCA, well collateralized RCA;  Peripheral Vascular Procedures:  carotid doppler April 2010, lower extremity arterial doppler April 2010;  LVEF of 60% documented via echocardiogram on 04/01/2011,  CHADS Score:  2,  CHA2DS2-VASC Score:  4 ____________________________ CARDIO-PULMONARY TEST DATES EKG Date:  03/16/2013;   Cardiac Cath Date:  12/14/2008;  Stent Placement Date: 12/14/2008;  Nuclear Study Date:  11/28/2009;  Echocardiography Date: 04/01/2011;  Chest Xray Date: 10/15/2011;  CT Scan Date:  11/26/2009   ____________________________ FAMILY HISTORY Brother -- Brother alive with problem, Coronary Artery  Disease, Coronary artery  bypass grafting Father -- Father dead, Congestive heart failure Mother -- Congestive heart failure, Mother dead Sister -- Sister alive and well ____________________________ SOCIAL HISTORY Alcohol Use:  no alcohol use;  Smoking:  40 pack year history, used to smoke but quit March 2011;  Diet:  regular diet;  Lifestyle:  divorced;  Exercise:  no regular exercise;  Occupation:  part time [employer];  Residence:  lives with daughter;  Spouse's Occupation:  divorced ____________________________ REVIEW OF SYSTEMS General:  malaise and fatigue Eyes: laser treatments, cataract extraction Respiratory: dyspnea with exertion Cardiovascular:  please review HPI Abdominal: dyspepsia Genitourinary-Male: nocturia  Musculoskeletal:  chronic low back pain Neurological:  neuropathy  ____________________________ PHYSICAL EXAMINATION VITAL SIGNS  Blood Pressure:  154/80 Sitting, Right arm, regular cuff  , 150/72 Standing, Right arm and regular cuff   Pulse:  88/min. Weight:  156.00 lbs. Height:  71"BMI: 22  Constitutional:  pleasant thin, white male in no acute distress, alert wearing oxygen Head:  normocephalic, normal hair pattern, no masses or tenderness Eyes:  EOMI, PERRLA, C and S clear ENT:  ears, nose and throat reveal no gross abnormalities.  Dentition good. Neck:  no JVD, bilateral carotid bruits, no masses, non-tender Chest:  normal symmetry, increased A-P diameter, clear to auscultation and percussion, firm mass in left breast, brest lightly swolen, non tender Cardiac:  regular rhythm, normal S1 and S2, no S3 or S4, grade 2/6 systolic murmur Peripheral Pulses:  femoral pulse 1+ on the right, femoral pulse 2+ on the left, pulses below the femoral arteries are diminished, bilateral femoral bruits present Extremities & Back:  bilateral venous insufficiency changes present, no edema present Neurological:  no gross motor or sensory deficits noted, affect appropriate, oriented  x3. ____________________________ MOST RECENT LIPID PANEL 09/16/12  CHOL TOTL 100 mg/dl, LDL 60 calc, HDL 29 mg/dl, TRIGLYCER 94 mg/dl and CHOL/HDL 3.3 (Calc) ____________________________ IMPRESSIONS/PLAN  1. Episodic dysphagia and food issues. He has had this intermittently through the years but is much more symptomatic now and I would like him to see a gastroenterologist about this. 2. Abdominal aneurysm with some slight growth since he was here but he is high risk for repair so this will be followed 3. Coronary artery disease 4. History of atrial fibrillation 5. Long-term anticoagulation with warfarin 6. Interstitial fibrosis  Recommendations:  Referral to gastroenterologist about his swallowing issues. His cardiac status is stable and he has no warfarin problems. Followup in 6 months. ____________________________ TODAYS ORDERS  1. Return Visit: 6 months  2. Gastroenterology Consult dysphagia: Schedule ASAP  3. Coag Clinic Visit: Coag OV 1 month                       ____________________________ Cardiology Physician:  Darden PalmerW. Spencer Shrey Boike, Jr. MD [facility]

## 2013-10-02 ENCOUNTER — Other Ambulatory Visit: Payer: Self-pay | Admitting: Gastroenterology

## 2013-10-02 DIAGNOSIS — R1310 Dysphagia, unspecified: Secondary | ICD-10-CM

## 2013-10-03 ENCOUNTER — Ambulatory Visit
Admission: RE | Admit: 2013-10-03 | Discharge: 2013-10-03 | Disposition: A | Discharge status: Home / Self Care | Payer: Medicare Other | Source: Ambulatory Visit | Attending: Gastroenterology | Admitting: Gastroenterology

## 2013-10-03 DIAGNOSIS — R1310 Dysphagia, unspecified: Secondary | ICD-10-CM

## 2013-10-10 ENCOUNTER — Encounter: Payer: Self-pay | Admitting: Internal Medicine

## 2013-10-10 DIAGNOSIS — R1319 Other dysphagia: Secondary | ICD-10-CM | POA: Insufficient documentation

## 2013-10-31 ENCOUNTER — Other Ambulatory Visit: Payer: Self-pay | Admitting: Internal Medicine

## 2013-11-30 ENCOUNTER — Encounter: Payer: Self-pay | Admitting: Pulmonary Disease

## 2013-11-30 ENCOUNTER — Ambulatory Visit (INDEPENDENT_AMBULATORY_CARE_PROVIDER_SITE_OTHER): Payer: Medicare Other | Admitting: Pulmonary Disease

## 2013-11-30 VITALS — BP 110/76 | HR 87 | Temp 97.8°F | Ht 71.0 in | Wt 157.0 lb

## 2013-11-30 DIAGNOSIS — J441 Chronic obstructive pulmonary disease with (acute) exacerbation: Secondary | ICD-10-CM

## 2013-11-30 DIAGNOSIS — J209 Acute bronchitis, unspecified: Secondary | ICD-10-CM | POA: Insufficient documentation

## 2013-11-30 MED ORDER — CEFDINIR 300 MG PO CAPS: 600.0000 mg | ORAL_CAPSULE | Freq: Every day | ORAL | Status: DC

## 2013-11-30 MED ORDER — PREDNISONE 10 MG PO TABS: ORAL_TABLET | ORAL | Status: DC

## 2013-11-30 NOTE — Patient Instructions (Signed)
Will treat with omnicef 300mg , take 2 each day for 5 days Prednisone taper over 8 days Keep followup with Dr. Craige CottaSood as scheduled, but let us know if you are not improving.

## 2013-11-30 NOTE — Assessment & Plan Note (Signed)
The patient is having increased cough with chest congestion and purulent mucus. He will need an antibiotic for this.

## 2013-11-30 NOTE — Progress Notes (Signed)
   Subjective:    Patient ID: Travis SnideGrady Bond, male    DOB: [birth year], 80 y.o.   MRN: [id number]  HPI The patient comes in today for an acute sick visit. He has known COPD and pulmonary fibrosis, and gives a few day history of cough with purulent mucus, chest congestion, and increased shortness of breath. He is concerned that his breathing is flaring, and wants to catch this before he gets worse. He is not having any fever, chills, or sweats.    Review of Systems  Constitutional: Negative for fever and unexpected weight change.  HENT: Positive for congestion, postnasal drip and rhinorrhea. Negative for dental problem, ear pain, nosebleeds, sinus pressure, sneezing, sore throat and trouble swallowing.   Eyes: Negative for redness and itching.  Respiratory: Positive for cough, chest tightness and shortness of breath. Negative for wheezing.   Cardiovascular: Negative for palpitations and leg swelling.  Gastrointestinal: Negative for nausea and vomiting.  Genitourinary: Negative for dysuria.  Musculoskeletal: Negative for joint swelling.  Skin: Negative for rash.  Neurological: Negative for headaches.  Hematological: Does not bruise/bleed easily.  Psychiatric/Behavioral: Negative for dysphoric mood. The patient is not nervous/anxious.        Objective:   Physical Exam Thin male in no acute distress Nose without purulence or discharge noted Neck without lymphadenopathy or thyromegaly Chest with decreased breath sounds throughout, a few rhonchi, no active wheezing Cardiac exam with regular rate and rhythm Lower extremities with mild edema, no cyanosis Alert and oriented, moves all 4 extremities       Assessment & Plan:

## 2013-11-30 NOTE — Assessment & Plan Note (Signed)
The patient is developing acute bronchitis which may be leading to a COPD exacerbation. He has increased shortness of breath, and although he does not have active wheezing, he does have diffuse rhonchi. Will treat him with a short course of prednisone to get him through this episode.

## 2013-12-05 ENCOUNTER — Other Ambulatory Visit: Payer: Self-pay | Admitting: Pulmonary Disease

## 2013-12-11 ENCOUNTER — Other Ambulatory Visit: Payer: Medicare Other

## 2013-12-11 DIAGNOSIS — R29898 Other symptoms and signs involving the musculoskeletal system: Secondary | ICD-10-CM

## 2013-12-11 DIAGNOSIS — I739 Peripheral vascular disease, unspecified: Secondary | ICD-10-CM

## 2013-12-11 DIAGNOSIS — R200 Anesthesia of skin: Secondary | ICD-10-CM

## 2013-12-11 DIAGNOSIS — I70219 Atherosclerosis of native arteries of extremities with intermittent claudication, unspecified extremity: Secondary | ICD-10-CM

## 2013-12-11 DIAGNOSIS — I714 Abdominal aortic aneurysm, without rupture, unspecified: Secondary | ICD-10-CM

## 2013-12-11 DIAGNOSIS — E039 Hypothyroidism, unspecified: Secondary | ICD-10-CM

## 2013-12-11 DIAGNOSIS — Z48812 Encounter for surgical aftercare following surgery on the circulatory system: Secondary | ICD-10-CM

## 2013-12-11 DIAGNOSIS — J9610 Chronic respiratory failure, unspecified whether with hypoxia or hypercapnia: Secondary | ICD-10-CM

## 2013-12-11 DIAGNOSIS — G629 Polyneuropathy, unspecified: Secondary | ICD-10-CM

## 2013-12-11 DIAGNOSIS — M79609 Pain in unspecified limb: Secondary | ICD-10-CM

## 2013-12-12 ENCOUNTER — Other Ambulatory Visit: Payer: Medicare Other

## 2013-12-13 ENCOUNTER — Ambulatory Visit: Payer: Medicare Other | Admitting: Internal Medicine

## 2013-12-13 LAB — TSH: TSH: 7.9 u[IU]/mL — AB (ref 0.450–4.500)

## 2013-12-13 LAB — COMPREHENSIVE METABOLIC PANEL
A/G RATIO: 1.4 (ref 1.1–2.5)
ALBUMIN: 4 g/dL (ref 3.5–4.7)
ALT: 16 IU/L (ref 0–44)
AST: 19 IU/L (ref 0–40)
Alkaline Phosphatase: 74 IU/L (ref 39–117)
BILIRUBIN TOTAL: 0.7 mg/dL (ref 0.0–1.2)
BUN / CREAT RATIO: 18 (ref 10–22)
BUN: 23 mg/dL (ref 8–27)
CHLORIDE: 96 mmol/L — AB (ref 97–108)
CO2: 29 mmol/L (ref 18–29)
Calcium: 9.1 mg/dL (ref 8.6–10.2)
Creatinine, Ser: 1.26 mg/dL (ref 0.76–1.27)
GFR calc Af Amer: 60 mL/min/{1.73_m2} (ref >59)
GFR calc non Af Amer: 52 mL/min/{1.73_m2} — ABNORMAL LOW (ref >59)
GLOBULIN, TOTAL: 2.8 g/dL (ref 1.5–4.5)
Glucose: 106 mg/dL — ABNORMAL HIGH (ref 65–99)
POTASSIUM: 4.3 mmol/L (ref 3.5–5.2)
Sodium: 138 mmol/L (ref 134–144)
TOTAL PROTEIN: 6.8 g/dL (ref 6.0–8.5)

## 2013-12-13 LAB — LIPID PANEL
CHOL/HDL RATIO: 3.2 ratio (ref 0.0–5.0)
Cholesterol, Total: 112 mg/dL (ref 100–199)
HDL: 35 mg/dL — AB (ref >39)
LDL Calculated: 61 mg/dL (ref 0–99)
Triglycerides: 82 mg/dL (ref 0–149)
VLDL CHOLESTEROL CAL: 16 mg/dL (ref 5–40)

## 2013-12-19 ENCOUNTER — Encounter: Payer: Self-pay | Admitting: Internal Medicine

## 2013-12-19 ENCOUNTER — Ambulatory Visit (INDEPENDENT_AMBULATORY_CARE_PROVIDER_SITE_OTHER): Payer: Medicare Other | Admitting: Internal Medicine

## 2013-12-19 VITALS — BP 132/80 | HR 68 | Temp 97.3°F | Wt 156.6 lb

## 2013-12-19 DIAGNOSIS — F419 Anxiety disorder, unspecified: Secondary | ICD-10-CM

## 2013-12-19 DIAGNOSIS — N183 Chronic kidney disease, stage 3 unspecified: Secondary | ICD-10-CM

## 2013-12-19 DIAGNOSIS — J4489 Other specified chronic obstructive pulmonary disease: Secondary | ICD-10-CM

## 2013-12-19 DIAGNOSIS — I714 Abdominal aortic aneurysm, without rupture, unspecified: Secondary | ICD-10-CM

## 2013-12-19 DIAGNOSIS — J449 Chronic obstructive pulmonary disease, unspecified: Secondary | ICD-10-CM

## 2013-12-19 DIAGNOSIS — F411 Generalized anxiety disorder: Secondary | ICD-10-CM

## 2013-12-19 DIAGNOSIS — I70219 Atherosclerosis of native arteries of extremities with intermittent claudication, unspecified extremity: Secondary | ICD-10-CM

## 2013-12-19 DIAGNOSIS — E039 Hypothyroidism, unspecified: Secondary | ICD-10-CM

## 2013-12-19 DIAGNOSIS — I4891 Unspecified atrial fibrillation: Secondary | ICD-10-CM

## 2013-12-19 MED ORDER — LEVOTHYROXINE SODIUM 88 MCG PO TABS: ORAL_TABLET | ORAL | Status: DC

## 2013-12-19 MED ORDER — DIGOXIN 125 MCG PO TABS: 0.1250 mg | ORAL_TABLET | Freq: Every day | ORAL | Status: DC

## 2013-12-19 NOTE — Progress Notes (Signed)
Passed clock drawing 

## 2013-12-19 NOTE — Progress Notes (Signed)
Patient ID: Travis Bond, male   DOB: [birth year], 80 y.o.   MRN: [id number]    Chief Complaint  Patient presents with  . Medical Management of Chronic Issues    4 month follow-up, discuss labs   No Known Allergies  HPI 80 y/o male pt is here for follow up. He feels at his best today. He recently had acute bronchitis and copd exacerbation and responded well to antibiotics and steroids. Denies any cough. Has been using his breathing treatent. He is somewhat confused about his medication list. Follows with dr Donnie Ahotilley from cardiology and called office and verified on digoxin 125 mcg daily. He has hx of AAA but denies any acute abdominal pain. Has remains chest pain free. No other complaints  Review of Systems  Constitutional: Negative for fever, chills, weight loss, malaise/fatigue and diaphoresis.  HENT: Negative for congestion, hearing loss and sore throat.   Eyes: Negative for blurred vision, double vision and discharge.  Respiratory: Negative for cough, sputum production, shortness of breath and wheezing.   Cardiovascular: Negative for chest pain, palpitations, orthopnea and leg swelling.  Gastrointestinal: Negative for heartburn, nausea, vomiting, abdominal pain, diarrhea and constipation.  Genitourinary: Negative for dysuria, urgency, frequency and flank pain.  Musculoskeletal: Negative for back pain, falls, joint pain and myalgias.  Skin: Negative for itching and rash.  Neurological: Negative for dizziness, tingling, focal weakness and headaches.  Psychiatric/Behavioral: Negative for depression and memory loss. The patient is not nervous/anxious.    Past Medical History  Diagnosis Date  . COPD (chronic obstructive pulmonary disease)   . Chronic respiratory failure with hypoxia   . Pulmonary fibrosis   . Coronary artery disease   . Hypertension   . Hyperlipidemia   . Carotid stenosis   . Peripheral vascular disease   . Anemia   . Peripheral neuropathy   . B12 deficiency     . Pneumonia   . Atrial fibrillation   . Hypothyroidism   . Anxiety state, unspecified   . Unspecified deficiency anemia   . Abdominal aneurysm without mention of rupture   . Aortic valve disorders   . Type II or unspecified type diabetes mellitus without mention of complication, not stated as uncontrolled   . Unspecified hereditary and idiopathic peripheral neuropathy   . Microscopic hematuria   . Chronic kidney disease     Kidney stones   Past Surgical History  Procedure Laterality Date  . Inguinal hernia repair  1994  . Cardioversion  09/21/2011    Procedure: CARDIOVERSION;  Surgeon: Darden PalmerW Spencer Tilley Jr., MD;  [facility];  Service: Cardiovascular;  Laterality: N/A;  . Cardiac stent  2010 or 2011 pt not sure  . Hernia repair  1989   Current Outpatient Prescriptions on File Prior to Visit  Medication Sig Dispense Refill  . albuterol (PROVENTIL HFA) 108 (90 BASE) MCG/ACT inhaler Take 1-2 puffs by mouth every 4-6 hours as needed for wheezing and shortness of breath  8.5 g  5  . albuterol (PROVENTIL) (2.5 MG/3ML) 0.083% nebulizer solution USE ONE VIAL VIA NEBULIZER 4 TIMES DAILY AS NEEDED FOR WHEEZING AND SHORTNESS OF BREATH  120 mL  5  . ALPRAZolam (XANAX) 0.25 MG tablet TAKE ONE TABLET BY MOUTH NIGHTLY AT BEDTIME   30 tablet  1  . calcium-vitamin D (OSCAL 500/200 D-3) 500-200 MG-UNIT per tablet Take 1 tablet by mouth 2 (two) times daily.  60 tablet  6  . clopidogrel (PLAVIX) 75 MG tablet Take 75 mg by mouth daily.        .Marland Kitchen  furosemide (LASIX) 40 MG tablet Take 1 tablet (40 mg total) by mouth daily.  30 tablet  3  . gabapentin (NEURONTIN) 300 MG capsule Take 300 mg by mouth 2 (two) times daily.       Marland Kitchen guaiFENesin (MUCINEX) 600 MG 12 hr tablet Take 600 mg by mouth 2 (two) times daily.      Marland Kitchen ipratropium (ATROVENT) 0.02 % nebulizer solution Take 2.5 mLs (500 mcg total) by nebulization 3 (three) times daily.  225 mL  11  . isosorbide mononitrate (IMDUR) 60 MG 24 hr tablet Take 60 mg  by mouth daily.        . niacin (NIASPAN) 500 MG CR tablet Take 500 mg by mouth at bedtime.        . nitroGLYCERIN (NITROSTAT) 0.4 MG SL tablet Place 1 tablet (0.4 mg total) under the tongue as needed. Chest pain  30 tablet  0  . pantoprazole (PROTONIX) 20 MG tablet Take 1 tablet (20 mg total) by mouth daily.  30 tablet  3  . pravastatin (PRAVACHOL) 20 MG tablet Take 20 mg by mouth. 1 BY MOUTH AT BEDTIME       . warfarin (COUMADIN) 3 MG tablet Take 3 mg by mouth daily.      . [DISCONTINUED] carvedilol (COREG) 3.125 MG tablet Take 3.125 mg by mouth 2 (two) times daily at 10 AM and 5 PM.        No current facility-administered medications on file prior to visit.   Physical exam BP 132/80  Pulse 68  Temp(Src) 97.3 F (36.3 C) (Oral)  Wt 156 lb 9.6 oz (71.033 kg)  SpO2 94%  General: thin elderly male in no acute distress Head:atraumatic, normocephalic, no pallor or icterus Ear/Nose/Throat: Hearing grossly intact, oropharynx without Erythema/Exudate Eyes: PERRLA, EOMI Neck: Supple, no nuchal rigidity, no palpable LAD Pulmonary: CTAB, no rales, rhonchi, & wheezing Cardiac: RRR, Normal S1, S2, no Murmurs, rubs or gallops Gastrointestinal: soft, non tender, no distension, no masses Musculoskeletal: strength 5/5, normal reflexes, kyphosis present Neurologic: Cranial nerve 2-12 intact , no focal deficit Psychiatric: Judgment intact, Mood & affect appropriate   Labs- Lab Results  Component Value Date   TSH 7.900* 12/12/2013   CMP     Component Value Date/Time   NA 138 12/12/2013 1033   NA 136 07/22/2011 0545   K 4.3 12/12/2013 1033   CL 96* 12/12/2013 1033   CO2 29 12/12/2013 1033   GLUCOSE 106* 12/12/2013 1033   GLUCOSE 102* 07/22/2011 0545   BUN 23 12/12/2013 1033   BUN 19 07/22/2011 0545   CREATININE 1.26 12/12/2013 1033   CALCIUM 9.1 12/12/2013 1033   PROT 6.8 12/12/2013 1033   PROT 6.8 07/14/2011 1324   ALBUMIN 3.6 07/14/2011 1324   AST 19 12/12/2013 1033   ALT 16 12/12/2013 1033     ALKPHOS 74 12/12/2013 1033   BILITOT 0.7 12/12/2013 1033   GFRNONAA 52* 12/12/2013 1033   GFRAA 60 12/12/2013 1033    12/19/13 mmse 30/30  Assessment/plan  1. Atrial fibrillation Stable. Continue digoxin for now with coumadin. Verified med list with cardiology office today  2. AAA (abdominal aortic aneurysm) without rupture Will get repeat usg to assess for size of aneurysm - US Aorta; Future  3. C O P D With pulmonary fibrosis and recent exacerbation. Currently breathing is stable continue current bronchodilators  4. Hypothyroidism Reviewed tsh and with worsening thyroid function, will increase levothyroxine to 88 mcg daily - T3, Free; Future - T4,  Free; Future - TSH; Future - CMP; Future  5. Anxiety continue current regimen of alprazolam  6. CKD (chronic kidney disease) stage 3, GFR 30-59 ml/min Avoid nsaids, monitor renal function

## 2013-12-21 ENCOUNTER — Encounter: Payer: Self-pay | Admitting: Vascular Surgery

## 2013-12-22 ENCOUNTER — Ambulatory Visit (HOSPITAL_COMMUNITY): Payer: Medicare Other

## 2013-12-22 ENCOUNTER — Ambulatory Visit (INDEPENDENT_AMBULATORY_CARE_PROVIDER_SITE_OTHER): Payer: Medicare Other | Admitting: Vascular Surgery

## 2013-12-22 ENCOUNTER — Ambulatory Visit (INDEPENDENT_AMBULATORY_CARE_PROVIDER_SITE_OTHER)
Admission: RE | Admit: 2013-12-22 | Discharge: 2013-12-22 | Disposition: A | Discharge status: Home / Self Care | Payer: Medicare Other | Source: Ambulatory Visit | Attending: Vascular Surgery | Admitting: Vascular Surgery

## 2013-12-22 ENCOUNTER — Encounter: Payer: Self-pay | Admitting: Vascular Surgery

## 2013-12-22 ENCOUNTER — Ambulatory Visit (HOSPITAL_COMMUNITY)
Admission: RE | Admit: 2013-12-22 | Discharge: 2013-12-22 | Disposition: A | Discharge status: Home / Self Care | Payer: Medicare Other | Source: Ambulatory Visit | Attending: Family | Admitting: Family

## 2013-12-22 VITALS — BP 126/54 | HR 65 | Ht 71.0 in | Wt 155.0 lb

## 2013-12-22 DIAGNOSIS — I70219 Atherosclerosis of native arteries of extremities with intermittent claudication, unspecified extremity: Secondary | ICD-10-CM | POA: Insufficient documentation

## 2013-12-22 DIAGNOSIS — R29898 Other symptoms and signs involving the musculoskeletal system: Secondary | ICD-10-CM

## 2013-12-22 DIAGNOSIS — I714 Abdominal aortic aneurysm, without rupture, unspecified: Secondary | ICD-10-CM | POA: Insufficient documentation

## 2013-12-22 DIAGNOSIS — I6529 Occlusion and stenosis of unspecified carotid artery: Secondary | ICD-10-CM | POA: Insufficient documentation

## 2013-12-22 DIAGNOSIS — G8929 Other chronic pain: Secondary | ICD-10-CM

## 2013-12-22 DIAGNOSIS — R209 Unspecified disturbances of skin sensation: Secondary | ICD-10-CM

## 2013-12-22 DIAGNOSIS — Z48812 Encounter for surgical aftercare following surgery on the circulatory system: Secondary | ICD-10-CM | POA: Insufficient documentation

## 2013-12-22 DIAGNOSIS — M79609 Pain in unspecified limb: Secondary | ICD-10-CM | POA: Insufficient documentation

## 2013-12-22 DIAGNOSIS — I739 Peripheral vascular disease, unspecified: Secondary | ICD-10-CM

## 2013-12-22 DIAGNOSIS — M545 Low back pain, unspecified: Secondary | ICD-10-CM

## 2013-12-22 DIAGNOSIS — R200 Anesthesia of skin: Secondary | ICD-10-CM

## 2013-12-22 NOTE — Progress Notes (Signed)
Established Abdominal Aortic Aneurysm  History of Present Illness  The patient is a 80 y.o. ([birth year]) male who presents with chief complaint: follow up for AAA.  Previous studies demonstrate an AAA, measuring 5.1 cm.  The patient has known chronic back pain which is unchanged in character or intensity.  The patient denies any abdominal pain.  The patient is not a smoker.  He has been also followed for B carotid stenoses.  He denies any TIA or CVA sx current.  His intermittent claudication is unchanged from previous.  His ambulation is severely limited by his severe COPD.  He frequently requires oxygen to ambulate.  The patient's PMH, PSH, SH, FamHx, Med, and Allergies are unchanged from 06/02/13.  Review of Systems (Positive items checked otherwise negative)  General: [ ]  Weight loss, [ ]  Weight gain, [ ]   Loss of appetite, [ ]  Fever  Neurologic: [ ]  Dizziness, [ ]  Blackouts, [ ]  Headaches, [ ]  Seizure  Ear/Nose/Throat: [ ]  Change in eyesight, [ ]  Change in hearing, [ ]  Nose bleeds, [ ]  Sore throat  Vascular: [x]  Pain in legs with walking, [ ]  Pain in feet while lying flat, [ ]  Non-healing ulcer, Stroke, [ ]  "Mini stroke", [ ]  Slurred speech, [ ]  Temporary blindness, [ ]  Blood clot in vein, [ ]  Phlebitis  Pulmonary: [x]  Home oxygen, [ ]  Productive cough, [ ]  Bronchitis, [ ]  Coughing up blood, [ ]  Asthma, [x]  Wheezing without treatments  Musculoskeletal: [x]  Arthritis, [ ]  Joint pain, [ ]  Muscle pain, [x]  chronic back pain  Cardiac: [ ]  Chest pain, [ ]  Chest tightness/pressure, [ ]  Shortness of breath when lying flat, [ ]  Shortness of breath with exertion, [ ]  Palpitations, [ ]  Heart murmur, [x]  Arrythmia, [x]  Atrial fibrillation  Hematologic: [ ]  Bleeding problems, [ ]  Clotting disorder, [ ]  Anemia  Psychiatric:  [ ]  Depression, [ ]  Anxiety, [ ]  Attention deficit disorder  Gastrointestinal:  [ ]  Black stool,[ ]   Blood in stool, [ ]  Peptic ulcer disease, [ ]  Reflux, [ ]  Hiatal  hernia, [ ]  Trouble swallowing, [ ]  Diarrhea, [ ]  Constipation, [x]  left inguinal hernia  Urinary:  [ ]  Kidney disease, [ ]  Burning with urination, [ ]  Frequent urination, [ ]  Difficulty urinating  Skin: [ ]  Ulcers, [ ]  Rashes  Physical Examination  Filed Vitals:   12/22/13 1025 12/22/13 1029  BP: 116/63 126/54  Pulse: 65   Height: 5\' 11"  (1.803 m)   Weight: 155 lb (70.308 kg)   SpO2: 94%    Body mass index is 21.63 kg/(m^2).  General: A&O x 3, WD. Sick appearing  Pulmonary: Sym exp, good air movt, distant BS, no rales, rhonchi, & wheezing  Cardiac: irr, irr, Nl S1, S2, no Murmurs, rubs or gallops  Vascular: Vessel Right Left  Radial Palpable Palpable  Brachial Palpable Palpable  Carotid Palpable, without bruit Palpable, without bruit  Aorta Not palpable N/A  Femoral Not Palpable Palpable  Popliteal Not palpable Not palpable  PT Not Palpable Not Palpable  DP Not Palpable Not Palpable   Gastrointestinal: soft, NTND, -G/R, - HSM, - masses, - CVAT B, LIH which is reducible, cannot palpate AAA  Musculoskeletal: M/S 5/5 throughout , Extremities without ischemic changes   Neurologic: CN 2-12 intact , Pain and light touch intact in extremities , Motor exam as listed above  Non-Invasive Vascular Imaging  AAA Duplex (12/22/2013)  5.2 cm x 4.9 cm   Previous size:  5.06 cm (  Date: 06/02/13)  Carotid Duplex (Date: 12/22/2013):   R ICA stenosis: 40-59%  R VA: patent and antegrade  L ICA stenosis: 40-59%  L VA: patent and antegrade  Medical Decision Making  The patient is a 80 y.o. male who presents with: asymptomatic AAA with slowly increasing size, severe COPD, intermittent claudication, chronic back pain, B carotid stenoses 40-59%   Based on this patient's exam and diagnostic studies, he needs q6 AAA duplex, annual carotid duplex, and q6 month ABI.  The threshold for repair is AAA size > 5.5 cm, growth > 1 cm/yr, and symptomatic status.  In this patient whose  vasculature appears to be problematic for access on his prior CT, EVAR might not be possible.  A repeat CTA once his AAA reaches repair size will be needed.   His severe COPD may also influence his ability to come off the ventilator post-operatively.  I emphasized the importance of maximal medical management including strict control of blood pressure, blood glucose, and lipid levels, antiplatelet agents, obtaining regular exercise, and cessation of smoking.   The patient is currently on a statin: Pravastatin The patient is currently on an anti-platelet: Plavix. The patient is also on an anticoagulant.  Thank you for allowing us to participate in this patient's care.  Leonides SakeBrian Chen, MD [facility] Office: [phone] Pager: [phone]  12/22/2013, 11:22 AM

## 2013-12-22 NOTE — Addendum Note (Signed)
Addended by: Sharee PimpleMCCHESNEY, MARILYN K on: 12/22/2013 05:09 PM   Modules accepted: Orders

## 2013-12-25 ENCOUNTER — Ambulatory Visit: Payer: Medicare Other | Admitting: Pulmonary Disease

## 2013-12-29 ENCOUNTER — Telehealth: Payer: Self-pay | Admitting: *Deleted

## 2013-12-29 NOTE — Telephone Encounter (Signed)
Patient's daughter called to inform us that her father fell approx, 5 days ago, and is complaining of pain in his back this morning. She is requesting advice on what should be done next. Informed daughter that the patient needed to be seen by someone today to be evaluated, emergency room or urgent care center.

## 2014-01-03 ENCOUNTER — Ambulatory Visit (INDEPENDENT_AMBULATORY_CARE_PROVIDER_SITE_OTHER): Payer: Medicare Other | Admitting: Internal Medicine

## 2014-01-03 ENCOUNTER — Encounter: Payer: Self-pay | Admitting: Internal Medicine

## 2014-01-03 ENCOUNTER — Ambulatory Visit
Admission: RE | Admit: 2014-01-03 | Discharge: 2014-01-03 | Disposition: A | Discharge status: Home / Self Care | Payer: Medicare Other | Source: Ambulatory Visit | Attending: Internal Medicine | Admitting: Internal Medicine

## 2014-01-03 VITALS — BP 122/64 | HR 61 | Temp 97.3°F | Wt 152.0 lb

## 2014-01-03 DIAGNOSIS — K5900 Constipation, unspecified: Secondary | ICD-10-CM

## 2014-01-03 DIAGNOSIS — M545 Low back pain, unspecified: Secondary | ICD-10-CM

## 2014-01-03 DIAGNOSIS — I6529 Occlusion and stenosis of unspecified carotid artery: Secondary | ICD-10-CM

## 2014-01-03 DIAGNOSIS — M533 Sacrococcygeal disorders, not elsewhere classified: Secondary | ICD-10-CM

## 2014-01-03 MED ORDER — OXYCODONE-ACETAMINOPHEN 5-325 MG PO TABS: 1.0000 | ORAL_TABLET | Freq: Four times a day (QID) | ORAL | Status: DC | PRN

## 2014-01-03 MED ORDER — LUBIPROSTONE 24 MCG PO CAPS: 24.0000 ug | ORAL_CAPSULE | Freq: Two times a day (BID) | ORAL | Status: DC

## 2014-01-03 NOTE — Progress Notes (Signed)
Patient ID: Travis SnideGrady Bond, male   DOB: 05/04/1927, 80 y.o.   MRN: [id number]    Chief Complaint  Patient presents with  . Acute Visit    fall with back pain, no bowel movement for 10 days   No Known Allergies  HPI Patient fell down 12 days back. He was sitting on a chair and saw a spider. He stood up to kill the spider and was stepping on it when he lost his balance and fell on the floor. Denies hitting his head and denies passing out. His back hit on the side of the couch and since then he has back pain. He has taken tylenol extra strength 2 tab bid without much help.it eases the pain some. Pain in his back is 4/10 at present and when it is bad, it can go up to 10 No new weakness reported in his leg. No sensory changes reported Has some radiation of the pain to his right leg He has  Hx of chronic lower back pain No problem controlling his bowel and bladder movement He has not had a bowel movement for 12 days. He tried suppository yesterday and this did not help. He has tried OTC stool softeners miralax and colace He has been passing flatus Has been having abdominal pain (generalized) but denies any nausea or vomiting. He has been able to eat his meals but is afraid of eating as he is not able to get out anything. He has tried straining in the toilet without any help but the straining worsens the pain He has hx of constipation even prior to fall as per patient.  ROS No fever or chills No chest pain or SOB Denies any urinary complaints  Past Medical History  Diagnosis Date  . COPD (chronic obstructive pulmonary disease)   . Chronic respiratory failure with hypoxia   . Pulmonary fibrosis   . Coronary artery disease   . Hypertension   . Hyperlipidemia   . Carotid stenosis   . Peripheral vascular disease   . Anemia   . Peripheral neuropathy   . B12 deficiency   . Pneumonia   . Atrial fibrillation   . Hypothyroidism   . Anxiety state, unspecified   . Unspecified deficiency  anemia   . Abdominal aneurysm without mention of rupture   . Aortic valve disorders   . Type II or unspecified type diabetes mellitus without mention of complication, not stated as uncontrolled   . Unspecified hereditary and idiopathic peripheral neuropathy   . Microscopic hematuria   . Chronic kidney disease     Kidney stones   Past Surgical History  Procedure Laterality Date  . Inguinal hernia repair  1994  . Cardioversion  09/21/2011    Procedure: CARDIOVERSION;  Surgeon: Darden PalmerW Spencer Tilley Jr., MD;  [facility];  Service: Cardiovascular;  Laterality: N/A;  . Cardiac stent  2010 or 2011 pt not sure  . Hernia repair  1989   Medication reviewed. See [place] [facility]  Physical exam BP 122/64  Pulse 61  Temp(Src) 97.3 F (36.3 C) (Oral)  Wt 152 lb (68.947 kg)  SpO2 96%  General: thin elderly male in no acute distress Head:atraumatic, normocephalic, no pallor or icterus Ear/Nose/Throat: Hearing grossly intact, oropharynx without Erythema/Exudate Eyes: PERRLA, EOMI Neck: Supple, no nuchal rigidity, no palpable LAD Pulmonary: CTAB, no rales, rhonchi, & wheezing Cardiac: RRR, Normal S1, S2, no Murmurs, rubs or gallops Gastrointestinal: soft, mild distension, hypoactive bowel sound, tenderness with deep palpation in mid and lower quadrant (  generalized), no masses Rectal exam: external 1 hemorrhoid noted, rectal vault empty on digital exam Musculoskeletal: strength 5/5, normal reflexes, kyphosis present, tenderness in lumboscaral area vertebral and paravertebral with bruise noted. Normal ROM at hip area Neurologic: Cranial nerve 2-12 intact , no focal deficit Psychiatric: Judgment intact, Mood & affect appropriate   Labs Lab Results  Component Value Date   TSH 7.900* 12/12/2013   Assessment/plan  1. Lumbosacral pain Post fall and has a bruise. Also has hx of chronic back issues. Will get xray to rule out fracture. Will have him on roxicet 5-325 1 tab q6h prn for pain with ice pack and  rest. - DG Sacrum/Coccyx; Future - DG Lumbar Spine Complete; Future  2. Constipation His worsening hypothyroidism could be contributing some. Normal rectal exam and sensation in perianal area normal. Given his abdominal exam finding with hypoactive bowel sounds, will get abdominal xray to rule out ileus and ? Obstruction. Once this is ruled out, will have him take amitiza 24 mcg bid for chronic constipation - DG Abd 2 Views; Future

## 2014-01-04 ENCOUNTER — Other Ambulatory Visit: Payer: Self-pay | Admitting: *Deleted

## 2014-01-05 ENCOUNTER — Ambulatory Visit (INDEPENDENT_AMBULATORY_CARE_PROVIDER_SITE_OTHER): Payer: Medicare Other | Admitting: Pulmonary Disease

## 2014-01-05 ENCOUNTER — Encounter: Payer: Self-pay | Admitting: Pulmonary Disease

## 2014-01-05 VITALS — BP 102/62 | HR 80 | Ht 71.0 in | Wt 153.0 lb

## 2014-01-05 DIAGNOSIS — J449 Chronic obstructive pulmonary disease, unspecified: Secondary | ICD-10-CM

## 2014-01-05 DIAGNOSIS — J9610 Chronic respiratory failure, unspecified whether with hypoxia or hypercapnia: Secondary | ICD-10-CM

## 2014-01-05 DIAGNOSIS — I6529 Occlusion and stenosis of unspecified carotid artery: Secondary | ICD-10-CM

## 2014-01-05 NOTE — Progress Notes (Signed)
Chief Complaint  Patient presents with  . COPD    Breathing is unchanged. Reports DOE. Denies chest tightness, coughing or wheezing.    CC: Travis Bond   History of Present Illness: Travis Bond is a 80 y.o. male former smoker with COPD, Pulmonary fibrosis, and hypoxemia.  He was seen in April by Dr. Shelle Ironlance for an exacerbation.  He is better now.  His breathing has been okay.  He gets occasional cough and clear sputum.  He denies wheeze, chest pain, or leg swelling.    He is using 2.5 to 3 liters oxygen 24/7.  He uses his nebulizer TID.   TESTS: Spirometry 12/05/09 >> FEV1 1.20(47%), FVC 2.56(64%), FEV1% 46  Travis Bond  has a past medical history of COPD (chronic obstructive pulmonary disease); Chronic respiratory failure with hypoxia; Pulmonary fibrosis; Coronary artery disease; Hypertension; Hyperlipidemia; Carotid stenosis; Peripheral vascular disease; Anemia; Peripheral neuropathy; B12 deficiency; Pneumonia; Atrial fibrillation; Hypothyroidism; Anxiety state, unspecified; Unspecified deficiency anemia; Abdominal aneurysm without mention of rupture; Aortic valve disorders; Type II or unspecified type diabetes mellitus without mention of complication, not stated as uncontrolled; Unspecified hereditary and idiopathic peripheral neuropathy; Microscopic hematuria; and Chronic kidney disease.  Travis Bond  has past surgical history that includes Inguinal hernia repair (1994); Cardioversion (09/21/2011); cardiac stent (2010 or 2011 pt not sure); and Hernia repair (1989).  Prior to Admission medications   Medication Sig Start Date End Date Taking? Authorizing Provider  albuterol (PROVENTIL HFA;VENTOLIN HFA) 108 (90 BASE) MCG/ACT inhaler One to two puffs by mouth every four to six hours as needed for wheezing and Shortness of breath 11/24/11  Yes Coralyn HellingVineet Caera Enwright, MD  albuterol (PROVENTIL) (2.5 MG/3ML) 0.083% nebulizer solution Take 3 mLs (2.5 mg total) by nebulization 4 (four) times  daily as needed for wheezing. Shortness of breath 02/15/12  Yes Coralyn HellingVineet Shaquila Sigman, MD  ALPRAZolam Prudy Feeler(XANAX) 0.25 MG tablet Take 1 tablet (0.25 mg total) by mouth at bedtime as needed for sleep. sleep 11/02/12  Yes Coralyn HellingVineet Viridiana Spaid, MD  clopidogrel (PLAVIX) 75 MG tablet Take 75 mg by mouth daily.     Yes Historical Provider, MD  digoxin (LANOXIN) 0.25 MG tablet Take 1 tablet (0.25 mg total) by mouth daily. 07/20/12 07/20/13 Yes Coralyn HellingVineet Jiraiya Mcewan, MD  gabapentin (NEURONTIN) 300 MG capsule Take 300 mg by mouth 2 (two) times daily.    Yes Historical Provider, MD  guaiFENesin (MUCINEX) 600 MG 12 hr tablet Take 600 mg by mouth 2 (two) times daily.   Yes Historical Provider, MD  ipratropium (ATROVENT) 0.02 % nebulizer solution Take 2.5 mLs (500 mcg total) by nebulization 2 (two) times daily. 02/15/12  Yes Coralyn HellingVineet Kalin Amrhein, MD  isosorbide mononitrate (IMDUR) 60 MG 24 hr tablet Take 60 mg by mouth daily.     Yes Historical Provider, MD  magnesium oxide (MAG-OX) 400 MG tablet Take 400 mg by mouth daily.   Yes Historical Provider, MD  niacin (NIASPAN) 500 MG CR tablet Take 500 mg by mouth at bedtime.     Yes Historical Provider, MD  nitroGLYCERIN (NITROSTAT) 0.4 MG SL tablet Place 0.4 mg under the tongue as needed. Chest pain   Yes Historical Provider, MD  pravastatin (PRAVACHOL) 20 MG tablet Take 20 mg by mouth. 1 BY MOUTH AT BEDTIME    Yes Historical Provider, MD  furosemide (LASIX) 40 MG tablet Take 40 mg by mouth daily. 07/22/11 07/21/12  Othella BoyerWilliam S Tilley, MD  levothyroxine (SYNTHROID, LEVOTHROID) 25 MCG tablet Take 25 mcg by mouth daily before breakfast. 07/22/11 07/21/12  Othella BoyerWilliam S Tilley, MD  warfarin (COUMADIN) 4 MG tablet Take 4 mg by mouth daily with supper. 07/22/11 07/21/12  Othella BoyerWilliam S Tilley, MD    No Known Allergies   Physical Exam:  General - No distress, wearing oxygen ENT - No sinus tenderness, clear nasal discharge, no oral exudate, no LAN Cardiac - s1s2 regular, no murmur Chest - Decreased breath sounds, no  wheeze/rales/dullness Back - No focal tenderness Abd - Soft, non-tender Ext - No edema Neuro - Normal strength Skin - No rashes Psych - normal mood, and behavior   Assessment/Plan:  Coralyn HellingVineet Shakiya Mcneary, MD [facility] Pager:  [phone] 01/05/2014, 2:22 PM

## 2014-01-05 NOTE — Assessment & Plan Note (Signed)
Continue 2.5 to 3 liters oxygen 24/7. 

## 2014-01-05 NOTE — Assessment & Plan Note (Signed)
Continue prn albuterol/ipratropium.

## 2014-01-05 NOTE — Patient Instructions (Signed)
Follow up in 6 months 

## 2014-01-17 ENCOUNTER — Encounter: Payer: Self-pay | Admitting: Internal Medicine

## 2014-01-17 ENCOUNTER — Ambulatory Visit (INDEPENDENT_AMBULATORY_CARE_PROVIDER_SITE_OTHER): Payer: Medicare Other | Admitting: Internal Medicine

## 2014-01-17 VITALS — BP 122/70 | HR 67 | Temp 97.4°F | Resp 18 | Ht 71.0 in | Wt 154.0 lb

## 2014-01-17 DIAGNOSIS — E039 Hypothyroidism, unspecified: Secondary | ICD-10-CM

## 2014-01-17 DIAGNOSIS — J9610 Chronic respiratory failure, unspecified whether with hypoxia or hypercapnia: Secondary | ICD-10-CM

## 2014-01-17 DIAGNOSIS — M4716 Other spondylosis with myelopathy, lumbar region: Secondary | ICD-10-CM

## 2014-01-17 DIAGNOSIS — I4891 Unspecified atrial fibrillation: Secondary | ICD-10-CM

## 2014-01-17 DIAGNOSIS — N183 Chronic kidney disease, stage 3 unspecified: Secondary | ICD-10-CM

## 2014-01-17 DIAGNOSIS — F419 Anxiety disorder, unspecified: Secondary | ICD-10-CM

## 2014-01-17 DIAGNOSIS — K5900 Constipation, unspecified: Secondary | ICD-10-CM

## 2014-01-17 DIAGNOSIS — Z5181 Encounter for therapeutic drug level monitoring: Secondary | ICD-10-CM | POA: Insufficient documentation

## 2014-01-17 DIAGNOSIS — I6529 Occlusion and stenosis of unspecified carotid artery: Secondary | ICD-10-CM

## 2014-01-17 DIAGNOSIS — F411 Generalized anxiety disorder: Secondary | ICD-10-CM

## 2014-01-17 MED ORDER — FUROSEMIDE 40 MG PO TABS: 40.0000 mg | ORAL_TABLET | Freq: Every day | ORAL | Status: DC

## 2014-01-17 MED ORDER — LUBIPROSTONE 24 MCG PO CAPS: 24.0000 ug | ORAL_CAPSULE | Freq: Two times a day (BID) | ORAL | Status: DC

## 2014-01-17 MED ORDER — TRAMADOL HCL 50 MG PO TABS: 50.0000 mg | ORAL_TABLET | Freq: Three times a day (TID) | ORAL | Status: DC

## 2014-01-17 MED ORDER — ALPRAZOLAM 0.25 MG PO TABS: ORAL_TABLET | ORAL | Status: DC

## 2014-01-17 NOTE — Progress Notes (Signed)
Patient ID: Travis Bond, male   DOB: May 17, 1927, 80 y.o.   MRN: [id number]    Chief Complaint  Patient presents with  . Follow-up    on back pain, anxiety, constipation   No Known Allergies  HPI 80 y/o male patient is here for follow up on his back pain and constipation. He needs refill on his alprazolam. His bowel movement is regular with amitiza. The roxicet is not helping with his back pain. He stopped taking the med due to feeling weird and excessively sleepy with the medication  Review of Systems   Constitutional: Negative for fever, chills, weight loss, malaise/fatigue and diaphoresis.   HENT: Negative for congestion, hearing loss and sore throat.    Eyes: Negative for blurred vision, double vision and discharge.   Respiratory: Negative for cough, sputum production, shortness of breath and wheezing.    Cardiovascular: Negative for chest pain, palpitations, orthopnea and leg swelling.   Gastrointestinal: Negative for heartburn, nausea, vomiting, abdominal pain, diarrhea   Genitourinary: Negative for dysuria, urgency, frequency and flank pain.   Musculoskeletal: Negative for falls, joint pain and myalgias.   Skin: Negative for itching and rash.   Neurological: Negative for dizziness, tingling, focal weakness and headaches.   Psychiatric/Behavioral: Negative for depression and memory loss. The patient is not nervous/anxious.       Physical exam BP 122/70  Pulse 67  Temp(Src) 97.4 F (36.3 C) (Oral)  Resp 18  Ht 5\' 11"  (1.803 m)  Wt 154 lb (69.854 kg)  BMI 21.49 kg/m2  SpO2 95%  General: thin elderly male in no acute distress Head:atraumatic, normocephalic, no pallor or icterus Ear/Nose/Throat: Hearing grossly intact, oropharynx normal Eyes: PERRLA, EOMI Neck: Supple, no nuchal rigidity, no palpable LAD Pulmonary: CTAB, no rales, rhonchi, & wheezing Cardiac: RRR, Normal S1, S2, no Murmurs, rubs or gallops Gastrointestinal: soft, non tender, no distension, no  masses Musculoskeletal: strength 5/5, normal reflexes, kyphosis present, no vertebral or paravertebral tenderness noted Neurologic: Cranial nerve 2-12 intact , no focal deficit Psychiatric: Judgment intact, Mood & affect appropriate  01/03/14 Lumbar spine xray  IMPRESSION: Moderate lower lumbar spondylosis, worst at L4-L5 and L5-S1.   Assessment/plan  1. Spondylosis, lumbar, with myelopathy Has lower back pain with occasional nerve pain, numbness and tingling in his legs. Will d/c roxicet with patient not tolerating it. Will have him on tramadol 50 mg q8h for now and reassess in 4 weeks. Also will get genetic testing to help assess the pain medication that would be metabolized correctly on this patient. Adjust pain med further as needed in next visit vs referral to orthopedic  2.Chronic respiratory failure Has copd and follows with pulmonary. Continue his breathing treatment, no changes  3. CKD (chronic kidney disease) stage 3, GFR 30-59 ml/min Avoid NSAIDs  4. Hypothyroidism Tolerating increased dosing of levothyroxine well. Monitor thyroid level  5. Anxiety continue current regimen of alprazolam. Refill provided  6. Constipation Tolerating amitiza well  7. Genetic testing Performed today, see above

## 2014-01-17 NOTE — Patient Instructions (Signed)
Take your new pain medication TRAMADOL 1 tablet 3 times a day. If no improvement noted, please notify my office.  Also stop taking ROXICET

## 2014-02-13 ENCOUNTER — Other Ambulatory Visit: Payer: Self-pay | Admitting: Internal Medicine

## 2014-02-14 ENCOUNTER — Telehealth: Payer: Self-pay | Admitting: Pulmonary Disease

## 2014-02-14 NOTE — Telephone Encounter (Signed)
This is regarding a CMN. Please advise alida thanks

## 2014-02-14 NOTE — Telephone Encounter (Signed)
This rx was signed and faxed back to our local [facility].  [facility] signed [facility], she will send it on tot the [facility]  I called and [facility] to [facility] and informed them of the same and gave them the # to the local [facility].

## 2014-02-16 ENCOUNTER — Other Ambulatory Visit (HOSPITAL_BASED_OUTPATIENT_CLINIC_OR_DEPARTMENT_OTHER): Payer: Self-pay | Admitting: Internal Medicine

## 2014-02-20 ENCOUNTER — Encounter: Payer: Self-pay | Admitting: Internal Medicine

## 2014-02-20 ENCOUNTER — Ambulatory Visit (INDEPENDENT_AMBULATORY_CARE_PROVIDER_SITE_OTHER): Payer: Medicare Other | Admitting: Internal Medicine

## 2014-02-20 VITALS — BP 138/80 | HR 86 | Temp 97.9°F | Resp 18 | Ht 71.0 in | Wt 149.6 lb

## 2014-02-20 DIAGNOSIS — G609 Hereditary and idiopathic neuropathy, unspecified: Secondary | ICD-10-CM

## 2014-02-20 DIAGNOSIS — K5900 Constipation, unspecified: Secondary | ICD-10-CM

## 2014-02-20 DIAGNOSIS — R0902 Hypoxemia: Secondary | ICD-10-CM

## 2014-02-20 DIAGNOSIS — M545 Low back pain, unspecified: Secondary | ICD-10-CM

## 2014-02-20 DIAGNOSIS — I6529 Occlusion and stenosis of unspecified carotid artery: Secondary | ICD-10-CM

## 2014-02-20 DIAGNOSIS — G8929 Other chronic pain: Secondary | ICD-10-CM

## 2014-02-20 DIAGNOSIS — R609 Edema, unspecified: Secondary | ICD-10-CM | POA: Insufficient documentation

## 2014-02-20 DIAGNOSIS — J9610 Chronic respiratory failure, unspecified whether with hypoxia or hypercapnia: Secondary | ICD-10-CM

## 2014-02-20 DIAGNOSIS — J449 Chronic obstructive pulmonary disease, unspecified: Secondary | ICD-10-CM

## 2014-02-20 DIAGNOSIS — J9611 Chronic respiratory failure with hypoxia: Secondary | ICD-10-CM

## 2014-02-20 DIAGNOSIS — J4489 Other specified chronic obstructive pulmonary disease: Secondary | ICD-10-CM

## 2014-02-20 DIAGNOSIS — E038 Other specified hypothyroidism: Secondary | ICD-10-CM

## 2014-02-20 DIAGNOSIS — G629 Polyneuropathy, unspecified: Secondary | ICD-10-CM

## 2014-02-20 MED ORDER — FUROSEMIDE 40 MG PO TABS: ORAL_TABLET | ORAL | Status: DC

## 2014-02-20 NOTE — Progress Notes (Signed)
Patient ID: Travis Bond, male   DOB: [birth year], 80 y.o.   MRN: [id number]    Chief Complaint  Patient presents with  . Follow-up   No Known Allergies  HPI 80 y/o male patient is here for follow up. The tramadol helped him with his back pain and he has not required it recently. He has noticed swelling in both his feet. Denies any new or worsening dyspnea. On oxygen. Gets tired easily and this makes his getting around difficult Bowel movement stable and regular with amitiza. Gets inr checked in coumadin clinic  Review of Systems   Constitutional: Negative for fever, chills, malaise/fatigue and diaphoresis. Continues to lose weight HENT: Negative for congestion, hearing loss and sore throat.    Eyes: Negative for blurred vision, double vision and discharge.   Respiratory: has occassional cough and phlegm production (chronic), has chronic shortness of breath and denies wheezing.    Cardiovascular: Negative for chest pain, palpitations, orthopnea and leg swelling. Has required 2 NTG over last month  Gastrointestinal: Negative for heartburn, nausea, vomiting, abdominal pain. Has lost his appetite Genitourinary: Negative for dysuria, urgency, frequency and flank pain.   Musculoskeletal: Negative for falls  Skin: Negative for itching and rash.   Neurological: Negative for dizziness, tingling, focal weakness and headaches.   Psychiatric/Behavioral: Negative for depression and memory loss. The patient is not nervous/anxious.     Past Medical History  Diagnosis Date  . COPD (chronic obstructive pulmonary disease)   . Chronic respiratory failure with hypoxia   . Pulmonary fibrosis   . Coronary artery disease   . Hypertension   . Hyperlipidemia   . Carotid stenosis   . Peripheral vascular disease   . Anemia   . Peripheral neuropathy   . B12 deficiency   . Pneumonia   . Atrial fibrillation   . Hypothyroidism   . Anxiety state, unspecified   . Unspecified deficiency anemia   .  Abdominal aneurysm without mention of rupture   . Aortic valve disorders   . Type II or unspecified type diabetes mellitus without mention of complication, not stated as uncontrolled   . Unspecified hereditary and idiopathic peripheral neuropathy   . Microscopic hematuria   . Chronic kidney disease     Kidney stones   Current Outpatient Prescriptions on File Prior to Visit  Medication Sig Dispense Refill  . albuterol (PROVENTIL HFA) 108 (90 BASE) MCG/ACT inhaler Take 1-2 puffs by mouth every 4-6 hours as needed for wheezing and shortness of breath  8.5 g  5  . albuterol (PROVENTIL) (2.5 MG/3ML) 0.083% nebulizer solution USE ONE VIAL VIA NEBULIZER 4 TIMES DAILY AS NEEDED FOR WHEEZING AND SHORTNESS OF BREATH  120 mL  5  . ALPRAZolam (XANAX) 0.25 MG tablet TAKE ONE TABLET BY MOUTH NIGHTLY AT BEDTIME  30 tablet  1  . AMITIZA 24 MCG capsule TAKE 1 CAPSULE TWICE DAILY WITH MEALS  60 capsule  0  . calcium-vitamin D (OSCAL 500/200 D-3) 500-200 MG-UNIT per tablet Take 1 tablet by mouth 2 (two) times daily.  60 tablet  6  . carvedilol (COREG) 3.125 MG tablet       . clopidogrel (PLAVIX) 75 MG tablet Take 75 mg by mouth daily.        . digoxin (LANOXIN) 0.125 MG tablet Take 1 tablet (0.125 mg total) by mouth daily. Provided by dr York Spanieltilley's office- cardiology  90 tablet  0  . furosemide (LASIX) 40 MG tablet Take 1 tablet (40 mg total) by mouth  daily.  30 tablet  3  . gabapentin (NEURONTIN) 300 MG capsule TAKE 2 CAPSULES TWICE DAILY FOR PAIN  360 capsule  3  . guaiFENesin (MUCINEX) 600 MG 12 hr tablet Take 600 mg by mouth 2 (two) times daily.      Marland Kitchen. ipratropium (ATROVENT) 0.02 % nebulizer solution Take 2.5 mLs (500 mcg total) by nebulization 3 (three) times daily.  225 mL  11  . isosorbide mononitrate (IMDUR) 60 MG 24 hr tablet Take 60 mg by mouth daily.        Marland Kitchen. levothyroxine (SYNTHROID, LEVOTHROID) 88 MCG tablet TAKE 1 TABLET DAILY BEFORE BREAKFAST for your thyroid problem  90 tablet  3  . niacin  (NIASPAN) 500 MG CR tablet Take 500 mg by mouth at bedtime.        . nitroGLYCERIN (NITROSTAT) 0.4 MG SL tablet Place 1 tablet (0.4 mg total) under the tongue as needed. Chest pain  30 tablet  0  . pantoprazole (PROTONIX) 20 MG tablet Take 1 tablet (20 mg total) by mouth daily.  30 tablet  3  . pravastatin (PRAVACHOL) 20 MG tablet Take 20 mg by mouth. 1 BY MOUTH AT BEDTIME       . warfarin (COUMADIN) 3 MG tablet Take 3 mg by mouth daily.       No current facility-administered medications on file prior to visit.    Physical exam BP 138/80  Pulse 86  Temp(Src) 97.9 F (36.6 C) (Oral)  Resp 18  Ht 5\' 11"  (1.803 m)  Wt 149 lb 9.6 oz (67.858 kg)  BMI 20.87 kg/m2  SpO2 96%  Wt Readings from Last 3 Encounters:  02/20/14 149 lb 9.6 oz (67.858 kg)  01/17/14 154 lb (69.854 kg)  01/05/14 153 lb (69.4 kg)   General: thin elderly male in no acute distress Head:atraumatic, normocephalic, no pallor or icterus Ear/Nose/Throat: Hearing grossly intact, oropharynx normal Eyes: PERRLA, EOMI Neck: Supple, no nuchal rigidity, no palpable LAD Pulmonary: CTAB, no rales, rhonchi, & wheezing, on o2 Cardiac: RRR, Normal S1, S2, no Murmurs, rubs or gallops Gastrointestinal: soft, non tender, no distension, no masses Musculoskeletal: normal reflexes, kyphosis present, no vertebral or paravertebral tenderness noted, stopped posture Neurologic: Cranial nerve 2-12 intact , no focal deficit Psychiatric: Judgment intact, Mood & affect appropriate  01/03/14 Lumbar spine xray   IMPRESSION: Moderate lower lumbar spondylosis, worst at L4-L5 and L5-S1.   Labs  Lab Results  Component Value Date   TSH 7.900* 12/12/2013   Lab Results  Component Value Date   INR 2.34* 09/21/2011   INR 2.46* 07/22/2011   INR 1.94* 07/21/2011   Lab Results  Component Value Date   CREATININE 1.26 12/12/2013   Lab Results  Component Value Date   WBC 5.8 05/17/2013   HGB 11.4* 05/17/2013   HCT 34.9* 05/17/2013   MCV 99*  05/17/2013   PLT 171 07/22/2011    Assessment/plan  Chronic respiratory failure with hypoxia Continue oxygen and bronchodilator treatment for now - DME Walker platform  Other specified hypothyroidism Check thyroid level. continue levothyroxine 88 mcg - TSH - CBC (no diff)  Peripheral neuropathy Continue neurontin current regimen  Chronic lower back pain Tolerating tramadol well and pain under control  Edema Continue lasix for now  C O P D With chronic resp failure, on o2. Given his exertional dyspnea with progressie copd and his kyphosis will write for a walker with platform to help with his quality of life and for safety - DME Scientific laboratory technicianWalker platform

## 2014-02-21 LAB — CBC
HCT: 33.8 % — ABNORMAL LOW (ref 37.5–51.0)
Hemoglobin: 11.1 g/dL — ABNORMAL LOW (ref 12.6–17.7)
MCH: 32.1 pg (ref 26.6–33.0)
MCHC: 32.8 g/dL (ref 31.5–35.7)
MCV: 98 fL — ABNORMAL HIGH (ref 79–97)
PLATELETS: 198 10*3/uL (ref 150–379)
RBC: 3.46 x10E6/uL — AB (ref 4.14–5.80)
RDW: 16.3 % — ABNORMAL HIGH (ref 12.3–15.4)
WBC: 6.7 10*3/uL (ref 3.4–10.8)

## 2014-02-21 LAB — TSH: TSH: 2.93 u[IU]/mL (ref 0.450–4.500)

## 2014-02-22 ENCOUNTER — Encounter: Payer: Self-pay | Admitting: *Deleted

## 2014-02-27 ENCOUNTER — Encounter: Payer: Self-pay | Admitting: Internal Medicine

## 2014-03-05 ENCOUNTER — Telehealth: Payer: Self-pay | Admitting: Pulmonary Disease

## 2014-03-05 MED ORDER — ALBUTEROL SULFATE (2.5 MG/3ML) 0.083% IN NEBU: INHALATION_SOLUTION | RESPIRATORY_TRACT | Status: DC

## 2014-03-05 NOTE — Telephone Encounter (Signed)
[facility] is calling back on albuterol needs presc and it will not cover it if it says as needed [phone] nancy

## 2014-03-05 NOTE — Telephone Encounter (Signed)
Pt's daughter can be reached at [phone].  States pt is in need of albuterol & ipratropium Rx [facility].  States they have been working on this for a while & pt will run out prior to this getting shipped & asks if we can make this ASAP.  Antionette FairyHolly D Pryor

## 2014-03-05 NOTE — Telephone Encounter (Signed)
rx has been printed out and given to rhonda to fax to [facility].  i have called the pts daughter renee and lmom to make her aware.

## 2014-03-08 ENCOUNTER — Encounter: Payer: Self-pay | Admitting: Cardiology

## 2014-03-08 NOTE — Progress Notes (Signed)
Patient ID: Travis Bond, male   DOB: Sep 20, 1926, 80 y.o.   MRN: [id number]   Travis SnideMcgalliard, Chong    Date of visit:  03/08/2014 DOB:  Sep 20, 1926    Age:  80 yrs. Medical record number:  [id number]     Account number:  [id number] Primary Care Provider: Oneal GroutPANDEY,MAHIMA ____________________________ CURRENT DIAGNOSES  1. Arrhythmia-Atrial Fibrillation  2. Congestive Heart Failure Chronic Diastolic  3. Long-term (current) use of anticoagulants  4. CAD,Native  5. Carotid Artery Stenosis  6. Unspecified Idiopathic Peripheral Neuropathy  7. Idiopathic Pulmonary Fibrosis  8. Stent Placement  9. Peripheral Vascular Disease  10. Aortic Valve Disorder  11. Hypothyroidism  12. Aneurysm-Abdominal  13. COPD  14. Hyperlipidemia ____________________________ ALLERGIES  No Known Drug Allergies ____________________________ MEDICATIONS  1. Nitroglycerin 0.4 mg Tablet, Sublingual, use prn  2. Proventil HFA 90 mcg/Actuation HFA Aerosol Inhaler, PRN  3. albuterol sulfate 2.5 mg/0.5 mL Solution for Nebulization, QID  4. Mucinex 600 mg Tablet Sustained Release, BID  5. ipratropium bromide 0.06 % Spray, Non-Aerosol, PRN  6. gabapentin 300 mg Capsule, BID  7. alprazolam 0.25 mg tablet, QHS  8. isosorbide mononitrate 60 mg tablet extended release 24 hr, 1 p.o. daily  9. warfarin 3 mg tablet, one daily or as directed  10. carvedilol 3.125 mg tablet, BID  11. Oyster Shell + D3 250 mg-125 unit tablet, BID  12. digoxin 125 mcg tablet, 1 p.o. daily  13. Niaspan 500 mg tablet,extended release, 1 p.o. daily  14. pravastatin 20 mg tablet, QHS  15. clopidogrel 75 mg tablet, 1 p.o. daily  16. Amitiza 24 mcg capsule, BID  17. levothyroxine 88 mcg tablet, 1 p.o. daily  18. furosemide 40 mg tablet, TID ____________________________ CHIEF COMPLAINTS  Followup of Arrhythmia-Atrial Fibrillation  Followup of CAD,Native ____________________________ HISTORY OF PRESENT ILLNESS  Patient seen for cardiac followup. He fell  a while back and was placed on a medicine that caused him to have significant edema as well as causes kidneys to fail. He hurt his back at that time but is slowly getting overall of that. Despite increasing his furosemide to a tablet half daily he continues to have edema. He doesn't have angina. He is severely limited with dyspnea and has to wear oxygen most of the time. His thyroid medicines have been adjusted. He has no PND or orthopnea. He has no significant claudication and is not currently having angina. He has had no bleeding complications from anticoagulation. ____________________________ PAST HISTORY  Past Medical Illnesses:  hypertension, hyperlipidemia, pneumonia, COPD, peripheral neuropathy, interstitial fibrosis;  Cardiovascular Illnesses:  CAD, peripheral vascular disease, mitral regurgitation, abdominal aneurysm;  Infectious Diseases:  no previous history of significant infectious diseases;  Surgical Procedures:  hernia repair;  Trauma History:  no previous history of significant trauma;  Cardiology Procedures-Invasive:  cardiac cath (left) April 2010, Xience DES stent  LAD April 2010, cardioversion January 2013;  Cardiology Procedures-Noninvasive:  regadenoson cardiolte, lexiscan cardiolite March 2011, echocardiogram August 2012;  Cardiac Cath Results:  30% stenosis proximal Left main, 90% stenosis mid LAD, no significant disease CFX, occluded RCA, left to right collateral RCA, well collateralized RCA;  Peripheral Vascular Procedures:  carotid doppler April 2010, lower extremity arterial doppler April 2010;  LVEF of 60% documented via echocardiogram on 04/01/2011,  CHADS Score:  2,  CHA2DS2-VASC Score:  4 ____________________________ CARDIO-PULMONARY TEST DATES EKG Date:  03/08/2014;   Cardiac Cath Date:  12/14/2008;  Stent Placement Date: 12/14/2008;  Nuclear Study Date:  11/28/2009;  Echocardiography Date: 04/01/2011;  Chest  Xray Date: 01/03/2014;  CT Scan Date:  11/26/2009    ____________________________ FAMILY HISTORY Brother -- Brother alive with problem, Coronary Artery Disease, Coronary artery bypass grafting Father -- Father dead, Congestive heart failure Mother -- Congestive heart failure, Mother dead Sister -- Sister alive and well ____________________________ SOCIAL HISTORY Alcohol Use:  no alcohol use;  Smoking:  40 pack year history, used to smoke but quit March 2011;  Diet:  regular diet;  Lifestyle:  divorced;  Exercise:  no regular exercise;  Occupation:  part time Arts administrator;  Residence:  lives with daughter;  Spouse's Occupation:  divorced ____________________________ REVIEW OF SYSTEMS General:  malaise and fatigue, weight loss of approximately 10 lbs Eyes: laser treatments, cataract extraction Respiratory: dyspnea with exertion Cardiovascular:  please review HPI Abdominal: anorexia and dyspepsia Genitourinary-Male: nocturia  Musculoskeletal:  chronic low back pain Neurological:  neuropathy  ____________________________ PHYSICAL EXAMINATION VITAL SIGNS  Blood Pressure:  110/70 Sitting, Left arm, regular cuff  , 134/74 Standing, Left arm and regular cuff   Pulse:  78/min. Weight:  146.50 lbs. Height:  71"BMI: 20  Constitutional:  pleasant thin, white male in no acute distress, alert wearing oxygen Head:  normocephalic, normal hair pattern, no masses or tenderness Eyes:  EOMI, PERRLA, C and S clear ENT:  ears, nose and throat reveal no gross abnormalities.  Dentition good. Neck:  no JVD, bilateral carotid bruits, no masses, non-tender Chest:  normal symmetry, increased A-P diameter, clear to auscultation and percussion, firm mass in left breast, brest lightly swolen, non tender Cardiac:  regular rhythm, normal S1 and S2, no S3 or S4, grade 2/6 systolic murmur Peripheral Pulses:  femoral pulse 1+ on the right, femoral pulse 2+ on the left, pulses below the femoral arteries are diminished, bilateral femoral bruits present Extremities & Back:   bilateral venous insufficiency changes present, no edema present Neurological:  no gross motor or sensory deficits noted, affect appropriate, oriented x3. ____________________________ MOST RECENT LIPID PANEL 12/12/13  CHOL TOTL 112 mg/dl, LDL 61 NM, HDL 35 mg/dl, TRIGLYCER 82 mg/dl,  CHOL/HDL 3.2 (Calc)  ____________________________ IMPRESSIONS/PLAN  1. Pulmonary fibrosis with limiting dyspnea 2. 10 pound weight loss with anorexia 3. Coronary artery disease 4. Atrial fibrillation maintaining sinus rhythm 6. Long-term anticoagulation 7. Edema likely due to venous disease may be an element of fluid retention. He has lost some weight.  Recommendations:  I talked to his daughter. It appears that he is going downhill and limited with dyspnea. Recommended that he increase his furosemide to 40 mg twice daily until the edema goes down. Followup in 6 months. ____________________________ TODAYS ORDERS  1. Return Visit: 6 months  2. Coag Clinic Visit: Coag OV 1 month  3. 12 Lead EKG: Today                       ____________________________ Cardiology Physician:  Darden Palmer MD [facility]

## 2014-03-20 ENCOUNTER — Encounter: Payer: Self-pay | Admitting: Internal Medicine

## 2014-03-20 ENCOUNTER — Ambulatory Visit (INDEPENDENT_AMBULATORY_CARE_PROVIDER_SITE_OTHER): Payer: Medicare Other | Admitting: Internal Medicine

## 2014-03-20 VITALS — BP 118/82 | HR 78 | Temp 97.5°F | Resp 24 | Ht 71.0 in | Wt 144.6 lb

## 2014-03-20 DIAGNOSIS — E038 Other specified hypothyroidism: Secondary | ICD-10-CM

## 2014-03-20 DIAGNOSIS — R739 Hyperglycemia, unspecified: Secondary | ICD-10-CM

## 2014-03-20 DIAGNOSIS — E785 Hyperlipidemia, unspecified: Secondary | ICD-10-CM

## 2014-03-20 DIAGNOSIS — R609 Edema, unspecified: Secondary | ICD-10-CM

## 2014-03-20 DIAGNOSIS — J9610 Chronic respiratory failure, unspecified whether with hypoxia or hypercapnia: Secondary | ICD-10-CM

## 2014-03-20 DIAGNOSIS — R2681 Unsteadiness on feet: Secondary | ICD-10-CM

## 2014-03-20 DIAGNOSIS — K219 Gastro-esophageal reflux disease without esophagitis: Secondary | ICD-10-CM

## 2014-03-20 DIAGNOSIS — R269 Unspecified abnormalities of gait and mobility: Secondary | ICD-10-CM

## 2014-03-20 DIAGNOSIS — M4716 Other spondylosis with myelopathy, lumbar region: Secondary | ICD-10-CM

## 2014-03-20 DIAGNOSIS — K5909 Other constipation: Secondary | ICD-10-CM

## 2014-03-20 DIAGNOSIS — I6529 Occlusion and stenosis of unspecified carotid artery: Secondary | ICD-10-CM

## 2014-03-20 DIAGNOSIS — R7309 Other abnormal glucose: Secondary | ICD-10-CM

## 2014-03-20 MED ORDER — FENTANYL 25 MCG/HR TD PT72: 25.0000 ug | MEDICATED_PATCH | TRANSDERMAL | Status: DC

## 2014-03-20 MED ORDER — FUROSEMIDE 40 MG PO TABS: ORAL_TABLET | ORAL | Status: DC

## 2014-03-20 NOTE — Progress Notes (Signed)
Patient ID: Travis SnideGrady Abello, male   DOB: August 10, 1927, 80 y.o.   MRN: [id number]    Chief Complaint  Patient presents with  . Follow-up   No Known Allergies  HPI 80 y/o male patient is here for follow up. He has copd with o2, chronic respiratory failure, afib on coumadin, constipation He has not got the walker yet as he has not been to med supply store yet Bowel movement has been regular with amitiza Tramadol discontinued with it not helping with his pain Feet swelling has improved some Continues to lose weight Not wearing his oxygen in the office Wt Readings from Last 3 Encounters:  03/20/14 144 lb 9.6 oz (65.59 kg)  02/20/14 149 lb 9.6 oz (67.858 kg)  01/17/14 154 lb (69.854 kg)   Review of Systems   Constitutional: Negative for fever, chills, malaise/fatigue and diaphoresis. Continues to lose weight HENT: Negative for congestion, hearing loss and sore throat.    Eyes: Negative for blurred vision, double vision and discharge.   Respiratory: has occassional cough and phlegm production (chronic), has chronic shortness of breath and denies wheezing.    Cardiovascular: Negative for chest pain, palpitations, orthopnea and leg swelling. His lasix was increased and he has noted improvement Gastrointestinal: Negative for heartburn, nausea, vomiting, abdominal pain.  Genitourinary: Negative for dysuria, urgency, frequency and flank pain.   Musculoskeletal: Negative for falls  Skin: Negative for itching and rash.   Neurological: Negative for dizziness, tingling, focal weakness and headaches.   Psychiatric/Behavioral: Negative for depression. The patient is not nervous/anxious.     [facility]. [facility]  Physical exam BP 118/82  Pulse 78  Temp(Src) 97.5 F (36.4 C) (Oral)  Resp 24  Ht 5\' 11"  (1.803 m)  Wt 144 lb 9.6 oz (65.59 kg)  BMI 20.18 kg/m2  SpO2 84%  General: thin elderly male in no acute distress Head:atraumatic, normocephalic, no pallor or  icterus Ear/Nose/Throat: Hearing grossly intact, oropharynx normal Eyes: PERRLA, EOMI Neck: Supple, no nuchal rigidity, no palpable LAD Pulmonary: poor air entry but no wheeze or rhonchi Cardiac: RRR, Normal S1, S2, no Murmurs, rubs or gallops Gastrointestinal: soft, non tender, no distension, no masses Musculoskeletal: normal reflexes, kyphosis present, no vertebral or paravertebral tenderness noted, stooped posture, small steps taken with wobbly gait Neurologic: Cranial nerve 2-12 intact , no focal deficit Psychiatric: Judgment intact, Mood & affect appropriate  01/03/14 Lumbar spine xray   IMPRESSION: Moderate lower lumbar spondylosis, worst at L4-L5 and L5-S1.  Assessment/plan  GERD With his symptom under control and tums helping with occassional heartburn, will discontinue PPI for him  Lumbar spondylosis Pain not under control. Off tramadol medication for now- self discontinued with no help noted by patient.. Continue neurontin 300 mg bid. Needs to get his walker. Will contact home health service directly for now to help with walker and do home health training to use a walker for safe transfer with his debility and required of oxygen and dyspnea.. Will start him on fentanyl patch 25 mcg q3 day for now and reassess  Hyperlipidemia Lipid Panel     Component Value Date/Time   TRIG 82 12/12/2013 1033   HDL 35* 12/12/2013 1033   CHOLHDL 3.2 12/12/2013 1033   LDLCALC 61 12/12/2013 1033   Continue pravachol 20 mg daily. Reviewed lipid panel  Constipation Continue amitiza, helpful at present  Other specified hypothyroidism continue levothyroxine 88 mcg Lab Results  Component Value Date   TSH 2.930 02/20/2014   Leg edema On lasix 40 mg  bid, tolerating this well. Since lasix dose was increased on 03/08/14 will check his renal function today Lab Results  Component Value Date   CREATININE 1.26 12/12/2013   Hyperglycemia Rule out DM - Basic Metabolic Panel - Hemoglobin  A1c  Chronic respiratory failure, unspecified whether with hypoxia or hypercapnia - to use o2 continuously, o2 drps to < 88 with mild exertion.  - Ambulatory referral to Home Health for training and supply of walker with platform  Unsteady gait See above - Ambulatory referral to Home Health

## 2014-03-20 NOTE — Patient Instructions (Signed)
Stop taking pantoprazole. If you develop heartburn or sour stomach and tums does not help, let me know.

## 2014-03-21 LAB — BASIC METABOLIC PANEL
BUN/Creatinine Ratio: 15 (ref 10–22)
BUN: 17 mg/dL (ref 8–27)
CALCIUM: 9.1 mg/dL (ref 8.6–10.2)
CO2: 30 mmol/L — ABNORMAL HIGH (ref 18–29)
Chloride: 92 mmol/L — ABNORMAL LOW (ref 97–108)
Creatinine, Ser: 1.17 mg/dL (ref 0.76–1.27)
GFR calc Af Amer: 66 mL/min/{1.73_m2} (ref >59)
GFR calc non Af Amer: 57 mL/min/{1.73_m2} — ABNORMAL LOW (ref >59)
GLUCOSE: 155 mg/dL — AB (ref 65–99)
Potassium: 3.6 mmol/L (ref 3.5–5.2)
Sodium: 137 mmol/L (ref 134–144)

## 2014-03-21 LAB — HEMOGLOBIN A1C
Est. average glucose Bld gHb Est-mCnc: 128 mg/dL
Hgb A1c MFr Bld: 6.1 % — ABNORMAL HIGH (ref 4.8–5.6)

## 2014-03-23 ENCOUNTER — Other Ambulatory Visit: Payer: Self-pay | Admitting: *Deleted

## 2014-03-23 ENCOUNTER — Other Ambulatory Visit: Payer: Self-pay | Admitting: Internal Medicine

## 2014-03-23 DIAGNOSIS — R279 Unspecified lack of coordination: Secondary | ICD-10-CM

## 2014-03-23 DIAGNOSIS — M4716 Other spondylosis with myelopathy, lumbar region: Secondary | ICD-10-CM

## 2014-03-23 DIAGNOSIS — J441 Chronic obstructive pulmonary disease with (acute) exacerbation: Secondary | ICD-10-CM

## 2014-03-23 DIAGNOSIS — Z5189 Encounter for other specified aftercare: Secondary | ICD-10-CM

## 2014-03-23 MED ORDER — ALPRAZOLAM 0.25 MG PO TABS: ORAL_TABLET | ORAL | Status: DC

## 2014-04-25 ENCOUNTER — Ambulatory Visit
Admission: RE | Admit: 2014-04-25 | Discharge: 2014-04-25 | Disposition: A | Discharge status: Home / Self Care | Payer: Medicare Other | Source: Ambulatory Visit | Attending: Internal Medicine | Admitting: Internal Medicine

## 2014-04-25 ENCOUNTER — Ambulatory Visit (INDEPENDENT_AMBULATORY_CARE_PROVIDER_SITE_OTHER): Payer: Medicare Other | Admitting: Internal Medicine

## 2014-04-25 ENCOUNTER — Encounter: Payer: Self-pay | Admitting: Internal Medicine

## 2014-04-25 VITALS — BP 124/78 | HR 72 | Temp 98.2°F | Resp 12 | Ht 71.0 in | Wt 140.0 lb

## 2014-04-25 DIAGNOSIS — J9611 Chronic respiratory failure with hypoxia: Secondary | ICD-10-CM

## 2014-04-25 DIAGNOSIS — R0902 Hypoxemia: Secondary | ICD-10-CM

## 2014-04-25 DIAGNOSIS — J9610 Chronic respiratory failure, unspecified whether with hypoxia or hypercapnia: Secondary | ICD-10-CM

## 2014-04-25 DIAGNOSIS — J449 Chronic obstructive pulmonary disease, unspecified: Secondary | ICD-10-CM

## 2014-04-25 DIAGNOSIS — I6529 Occlusion and stenosis of unspecified carotid artery: Secondary | ICD-10-CM

## 2014-04-25 DIAGNOSIS — R634 Abnormal weight loss: Secondary | ICD-10-CM

## 2014-04-25 MED ORDER — FLUTICASONE-SALMETEROL 250-50 MCG/DOSE IN AEPB: 1.0000 | INHALATION_SPRAY | Freq: Two times a day (BID) | RESPIRATORY_TRACT | Status: DC

## 2014-04-25 MED ORDER — TETANUS-DIPHTH-ACELL PERTUSSIS 5-2.5-18.5 LF-MCG/0.5 IM SUSP: 0.5000 mL | Freq: Once | INTRAMUSCULAR | Status: DC

## 2014-04-25 MED ORDER — FUROSEMIDE 20 MG PO TABS: ORAL_TABLET | ORAL | Status: DC

## 2014-04-25 MED ORDER — LUBIPROSTONE 24 MCG PO CAPS: ORAL_CAPSULE | ORAL | Status: DC

## 2014-04-25 MED ORDER — ALPRAZOLAM 0.25 MG PO TABS: ORAL_TABLET | ORAL | Status: DC

## 2014-04-25 NOTE — Progress Notes (Signed)
Patient ID: Travis Bond, male   DOB: April 21, 1927, 80 y.o.   MRN: [id number]    Chief Complaint  Patient presents with  . Follow-up    Increased SOB, 02 stats have dropped to 70 % with oxygen per home health therapist. Patient is here today with daughter  . Weight Loss    Losing weight rapidly   . Medication Management    ? if Fentanyl patch is causing breating issues    No Known Allergies  HPI pt with end stage copd on chronic oxygen is here with his daughter. She is concerned about him having increased cough and worsening of his shortness of breath. He is on 3 l/min of o2 and 2 days back his o2 sat dropped to ? 70s on exertion. It came back up with rest. She has also noticed blood in his cough. She is concerned about his weight loss. Pt mentions feeling short winded with mild exertion. He also has history of AS and afib.  Review of Systems  Constitutional: Negative for fever, chills, diaphoresis.  HENT: Negative for congestion, hearing loss and sore throat.   Cardiovascular: Negative for chest pain, palpitations, orthopnea and leg swelling.  Gastrointestinal: Negative for heartburn, nausea, vomiting, abdominal pain Skin: Negative for itching and rash.  Neurological: Negative for dizziness, tingling, focal weakness and headaches.   Past Medical History  Diagnosis Date  . COPD (chronic obstructive pulmonary disease)   . Chronic respiratory failure with hypoxia   . Pulmonary fibrosis   . Coronary artery disease   . Hypertension   . Hyperlipidemia   . Carotid stenosis   . Peripheral vascular disease   . Anemia   . Peripheral neuropathy   . B12 deficiency   . Pneumonia   . Atrial fibrillation   . Hypothyroidism   . Anxiety state, unspecified   . Unspecified deficiency anemia   . Abdominal aneurysm without mention of rupture   . Aortic valve disorders   . Type II or unspecified type diabetes mellitus without mention of complication, not stated as uncontrolled   .  Unspecified hereditary and idiopathic peripheral neuropathy   . Microscopic hematuria   . Chronic kidney disease     Kidney stones   Current Outpatient Prescriptions on File Prior to Visit  Medication Sig Dispense Refill  . albuterol (PROVENTIL HFA) 108 (90 BASE) MCG/ACT inhaler Take 1-2 puffs by mouth every 4-6 hours as needed for wheezing and shortness of breath  8.5 g  5  . albuterol (PROVENTIL) (2.5 MG/3ML) 0.083% nebulizer solution USE ONE VIAL VIA NEBULIZER 4 TIMES DAILY  FOR WHEEZING AND SHORTNESS OF BREATH  120 mL  5  . calcium-vitamin D (OSCAL 500/200 D-3) 500-200 MG-UNIT per tablet Take 1 tablet by mouth 2 (two) times daily.  60 tablet  6  . carvedilol (COREG) 3.125 MG tablet       . clopidogrel (PLAVIX) 75 MG tablet Take 75 mg by mouth daily.        . digoxin (LANOXIN) 0.125 MG tablet Take 1 tablet (0.125 mg total) by mouth daily. Provided by dr York Spaniel office- cardiology  90 tablet  0  . fentaNYL (DURAGESIC - DOSED MCG/HR) 25 MCG/HR patch Place 1 patch (25 mcg total) onto the skin every 3 (three) days.  10 patch  0  . gabapentin (NEURONTIN) 300 MG capsule TAKE 2 CAPSULES TWICE DAILY FOR PAIN  360 capsule  3  . guaiFENesin (MUCINEX) 600 MG 12 hr tablet Take 600 mg by mouth 2 (  two) times daily.      Marland Kitchen ipratropium (ATROVENT) 0.02 % nebulizer solution Take 2.5 mLs (500 mcg total) by nebulization 3 (three) times daily.  225 mL  11  . isosorbide mononitrate (IMDUR) 60 MG 24 hr tablet Take 60 mg by mouth daily.        Marland Kitchen levothyroxine (SYNTHROID, LEVOTHROID) 88 MCG tablet TAKE 1 TABLET DAILY BEFORE BREAKFAST for your thyroid problem  90 tablet  3  . niacin (NIASPAN) 500 MG CR tablet Take 500 mg by mouth at bedtime.        . nitroGLYCERIN (NITROSTAT) 0.4 MG SL tablet Place 1 tablet (0.4 mg total) under the tongue as needed. Chest pain  30 tablet  0  . pravastatin (PRAVACHOL) 20 MG tablet Take 20 mg by mouth. 1 BY MOUTH AT BEDTIME       . warfarin (COUMADIN) 3 MG tablet Take 3 mg by mouth  daily.       No current facility-administered medications on file prior to visit.   Physical exam BP 124/78  Pulse 72  Temp(Src) 98.2 F (36.8 C) (Oral)  Resp 12  Ht  (1.803 m)  Wt 140 lb (63.504 kg)  BMI 19.53 kg/m2  SpO2 98%  General: thin, frail elderly male in no acute distress Head:atraumatic, normocephalic, no pallor or icterus Eyes: PERRLA, EOMI Oropharynx: no erythema, moist mucus membrane Neck: Supple, no palpable LAD Pulmonary: poor air entry but no wheeze or rhonchi, no crackles, on o2 by nasal canula Cardiac: RRR, Normal S1, S2, no Murmurs, rubs or gallops Gastrointestinal: soft, non tender, no distension, no masses Musculoskeletal: normal reflexes, kyphosis present, no vertebral or paravertebral tenderness noted, stooped posture, small steps taken with wobbly gait Neurologic: Cranial nerve 2-12 intact , no focal deficit Psychiatric: Judgment intact, Mood & affect appropriate  Assessment/plan  1. Loss of weight He has progressive copd and his loss of weight is likely in setting of progression of his copd. He has lost muscle mass. wil check prealbumin level to help assess for protein deficiency. Also get cxr to rule out lung mass/ obstruction raising concern for malignancy causing his weight loss. Normal thyroid function - Prealbumin - DG Chest 2 View; Future  2. C O P D On albuterol and ipratropium with o2. Will add advair bid for now to help with long term bronchodilation to helpw ith his breathing - DG Chest 2 View; Future  3. Chronic respiratory failure with hypoxia Progressing. Continue o2. cxr to rule out infiltrates/ pneumonia. Monitor for early signs of copd exacerbation. If xray shows infiltrates, needs antibiotics. - DG Chest 2 View; Future

## 2014-04-26 ENCOUNTER — Other Ambulatory Visit: Payer: Self-pay | Admitting: *Deleted

## 2014-04-26 LAB — PREALBUMIN: PREALBUMIN: 16 mg/dL — AB (ref 20–40)

## 2014-04-26 MED ORDER — MOXIFLOXACIN HCL 400 MG PO TABS: ORAL_TABLET | ORAL | Status: DC

## 2014-04-26 MED ORDER — SACCHAROMYCES BOULARDII 250 MG PO CAPS: ORAL_CAPSULE | ORAL | Status: DC

## 2014-04-26 NOTE — Telephone Encounter (Signed)
Sent to [facility] per Dr. Glade Lloyd due to early pneumonia from Chest X-Ray

## 2014-04-27 ENCOUNTER — Ambulatory Visit: Payer: Medicare Other | Admitting: Internal Medicine

## 2014-05-10 ENCOUNTER — Telehealth: Payer: Self-pay | Admitting: *Deleted

## 2014-05-10 NOTE — Telephone Encounter (Signed)
Encourage to use oxygen.

## 2014-05-10 NOTE — Telephone Encounter (Signed)
FYI---Continues to desaturate and recovers about 5 minutes after resting. Patient is not consistent with wearing his O2. [facility] is going to continue skilled care for now for the next 3 weeks.

## 2014-05-10 NOTE — Telephone Encounter (Signed)
HomeHealth is encouraging Oxygen usage

## 2014-05-11 ENCOUNTER — Telehealth: Payer: Self-pay | Admitting: *Deleted

## 2014-05-11 NOTE — Telephone Encounter (Signed)
Revonda Standard with [facility] called and stated that patient is complaining when he coughs his sputum is black. Patient has an appointment with Pulmonologist on 05/23/2014. Please advise.

## 2014-05-14 NOTE — Telephone Encounter (Signed)
He has end stage copd and his chronic bronchitis could have caused this. But if he has clear blood in his sputum, i will have him see pulmonary sooner than 05/23/14.

## 2014-05-14 NOTE — Telephone Encounter (Signed)
Informed Revonda Standard with [facility] and she will take care of.

## 2014-05-17 ENCOUNTER — Telehealth: Payer: Self-pay | Admitting: Pulmonary Disease

## 2014-05-17 NOTE — Telephone Encounter (Signed)
Called Clayborne Dana w/ [facility] x1

## 2014-05-18 NOTE — Telephone Encounter (Signed)
Called spoke with Patti at the number below Advised of VS' recommendations to go ahead and proceed to the ER.  Patti verbalized her understanding.  Nothing further needed at this time; will sign off.

## 2014-05-18 NOTE — Telephone Encounter (Signed)
The number listed in the message is not patti's number.  Her cell number is [phone]  Spoke with Clayborne Dana from [facility]:  She stated that the pt is desating with activity down into the 80"s and it is taking 5-10 mins for the pt to bring these numbers back up on 2.5 liters.  She stated that the pt was seen by Dr. Sander Radon a week or so ago and was dx with PNA and was given ABX and has since finished these.  The pt is still coughing up sputum with color and yesterday the sputum was bright red.  The lungs sound clear but are diminished.  VS please advise. Thanks  No Known Allergies  Current Outpatient Prescriptions on File Prior to Visit  Medication Sig Dispense Refill  . albuterol (PROVENTIL HFA) 108 (90 BASE) MCG/ACT inhaler Take 1-2 puffs by mouth every 4-6 hours as needed for wheezing and shortness of breath  8.5 g  5  . albuterol (PROVENTIL) (2.5 MG/3ML) 0.083% nebulizer solution USE ONE VIAL VIA NEBULIZER 4 TIMES DAILY  FOR WHEEZING AND SHORTNESS OF BREATH  120 mL  5  . ALPRAZolam (XANAX) 0.25 MG tablet TAKE ONE TABLET BY MOUTH NIGHTLY AT BEDTIME  30 tablet  3  . calcium-vitamin D (OSCAL 500/200 D-3) 500-200 MG-UNIT per tablet Take 1 tablet by mouth 2 (two) times daily.  60 tablet  6  . carvedilol (COREG) 3.125 MG tablet       . clopidogrel (PLAVIX) 75 MG tablet Take 75 mg by mouth daily.        . digoxin (LANOXIN) 0.125 MG tablet Take 1 tablet (0.125 mg total) by mouth daily. Provided by dr York Spaniel office- cardiology  90 tablet  0  . fentaNYL (DURAGESIC - DOSED MCG/HR) 25 MCG/HR patch Place 1 patch (25 mcg total) onto the skin every 3 (three) days.  10 patch  0  . Fluticasone-Salmeterol (ADVAIR DISKUS) 250-50 MCG/DOSE AEPB Inhale 1 puff into the lungs 2 (two) times daily.  60 each  1  . furosemide (LASIX) 20 MG tablet Take one tablet twice a day  90 tablet  3  . gabapentin (NEURONTIN) 300 MG capsule TAKE 2 CAPSULES TWICE DAILY FOR PAIN  360 capsule  3  . guaiFENesin (MUCINEX) 600 MG 12 hr tablet  Take 600 mg by mouth 2 (two) times daily.      Marland Kitchen ipratropium (ATROVENT) 0.02 % nebulizer solution Take 2.5 mLs (500 mcg total) by nebulization 3 (three) times daily.  225 mL  11  . isosorbide mononitrate (IMDUR) 60 MG 24 hr tablet Take 60 mg by mouth daily.        Marland Kitchen levothyroxine (SYNTHROID, LEVOTHROID) 88 MCG tablet TAKE 1 TABLET DAILY BEFORE BREAKFAST for your thyroid problem  90 tablet  3  . lubiprostone (AMITIZA) 24 MCG capsule TAKE 1 CAPSULE TWICE DAILY WITH MEALS  60 capsule  0  . moxifloxacin (AVELOX) 400 MG tablet Take one tablet by mouth once daily for one week  7 tablet  0  . niacin (NIASPAN) 500 MG CR tablet Take 500 mg by mouth at bedtime.        . nitroGLYCERIN (NITROSTAT) 0.4 MG SL tablet Place 1 tablet (0.4 mg total) under the tongue as needed. Chest pain  30 tablet  0  . pravastatin (PRAVACHOL) 20 MG tablet Take 20 mg by mouth. 1 BY MOUTH AT BEDTIME       . saccharomyces boulardii (FLORASTOR) 250 MG capsule Take one tablet by mouth twice  daily for 2 weeks  14 capsule  0  . Tdap (BOOSTRIX) 5-2.5-18.5 LF-MCG/0.5 injection Inject 0.5 mLs into the muscle once.  0.5 mL  0  . warfarin (COUMADIN) 3 MG tablet Take 3 mg by mouth daily.       No current facility-administered medications on file prior to visit.

## 2014-05-18 NOTE — Telephone Encounter (Signed)
Sounds like he needs to go to hospital.

## 2014-05-22 ENCOUNTER — Encounter (HOSPITAL_COMMUNITY): Payer: Self-pay | Admitting: Emergency Medicine

## 2014-05-22 ENCOUNTER — Emergency Department (HOSPITAL_COMMUNITY): Payer: Medicare Other

## 2014-05-22 ENCOUNTER — Inpatient Hospital Stay (HOSPITAL_COMMUNITY)
Admission: EM | Admit: 2014-05-22 | Discharge: 2014-06-04 | DRG: 194 | Disposition: A | Discharge status: Skilled Nursing Facility | Payer: Medicare Other | Attending: Internal Medicine | Admitting: Internal Medicine

## 2014-05-22 DIAGNOSIS — E039 Hypothyroidism, unspecified: Secondary | ICD-10-CM | POA: Diagnosis present

## 2014-05-22 DIAGNOSIS — I509 Heart failure, unspecified: Secondary | ICD-10-CM | POA: Diagnosis not present

## 2014-05-22 DIAGNOSIS — D649 Anemia, unspecified: Secondary | ICD-10-CM

## 2014-05-22 DIAGNOSIS — E46 Unspecified protein-calorie malnutrition: Secondary | ICD-10-CM | POA: Diagnosis not present

## 2014-05-22 DIAGNOSIS — J439 Emphysema, unspecified: Secondary | ICD-10-CM | POA: Diagnosis present

## 2014-05-22 DIAGNOSIS — R0602 Shortness of breath: Secondary | ICD-10-CM | POA: Diagnosis present

## 2014-05-22 DIAGNOSIS — Z7901 Long term (current) use of anticoagulants: Secondary | ICD-10-CM | POA: Diagnosis not present

## 2014-05-22 DIAGNOSIS — N183 Chronic kidney disease, stage 3 unspecified: Secondary | ICD-10-CM

## 2014-05-22 DIAGNOSIS — J431 Panlobular emphysema: Secondary | ICD-10-CM

## 2014-05-22 DIAGNOSIS — I714 Abdominal aortic aneurysm, without rupture, unspecified: Secondary | ICD-10-CM

## 2014-05-22 DIAGNOSIS — R040 Epistaxis: Secondary | ICD-10-CM | POA: Diagnosis not present

## 2014-05-22 DIAGNOSIS — I129 Hypertensive chronic kidney disease with stage 1 through stage 4 chronic kidney disease, or unspecified chronic kidney disease: Secondary | ICD-10-CM | POA: Diagnosis present

## 2014-05-22 DIAGNOSIS — R042 Hemoptysis: Secondary | ICD-10-CM

## 2014-05-22 DIAGNOSIS — Z6821 Body mass index (BMI) 21.0-21.9, adult: Secondary | ICD-10-CM | POA: Diagnosis not present

## 2014-05-22 DIAGNOSIS — I4891 Unspecified atrial fibrillation: Secondary | ICD-10-CM | POA: Diagnosis present

## 2014-05-22 DIAGNOSIS — J9610 Chronic respiratory failure, unspecified whether with hypoxia or hypercapnia: Secondary | ICD-10-CM | POA: Diagnosis present

## 2014-05-22 DIAGNOSIS — J430 Unilateral pulmonary emphysema [MacLeod's syndrome]: Secondary | ICD-10-CM

## 2014-05-22 DIAGNOSIS — Z7902 Long term (current) use of antithrombotics/antiplatelets: Secondary | ICD-10-CM

## 2014-05-22 DIAGNOSIS — I482 Chronic atrial fibrillation, unspecified: Secondary | ICD-10-CM

## 2014-05-22 DIAGNOSIS — G609 Hereditary and idiopathic neuropathy, unspecified: Secondary | ICD-10-CM | POA: Diagnosis present

## 2014-05-22 DIAGNOSIS — I739 Peripheral vascular disease, unspecified: Secondary | ICD-10-CM | POA: Diagnosis present

## 2014-05-22 DIAGNOSIS — E119 Type 2 diabetes mellitus without complications: Secondary | ICD-10-CM | POA: Diagnosis not present

## 2014-05-22 DIAGNOSIS — Z9981 Dependence on supplemental oxygen: Secondary | ICD-10-CM

## 2014-05-22 DIAGNOSIS — J8410 Pulmonary fibrosis, unspecified: Secondary | ICD-10-CM | POA: Diagnosis present

## 2014-05-22 DIAGNOSIS — I5042 Chronic combined systolic (congestive) and diastolic (congestive) heart failure: Secondary | ICD-10-CM | POA: Diagnosis present

## 2014-05-22 DIAGNOSIS — G608 Other hereditary and idiopathic neuropathies: Secondary | ICD-10-CM | POA: Diagnosis not present

## 2014-05-22 DIAGNOSIS — J441 Chronic obstructive pulmonary disease with (acute) exacerbation: Secondary | ICD-10-CM | POA: Diagnosis not present

## 2014-05-22 DIAGNOSIS — Z955 Presence of coronary angioplasty implant and graft: Secondary | ICD-10-CM

## 2014-05-22 DIAGNOSIS — Z833 Family history of diabetes mellitus: Secondary | ICD-10-CM | POA: Diagnosis not present

## 2014-05-22 DIAGNOSIS — J449 Chronic obstructive pulmonary disease, unspecified: Secondary | ICD-10-CM

## 2014-05-22 DIAGNOSIS — J189 Pneumonia, unspecified organism: Secondary | ICD-10-CM | POA: Diagnosis not present

## 2014-05-22 DIAGNOSIS — J9611 Chronic respiratory failure with hypoxia: Secondary | ICD-10-CM

## 2014-05-22 DIAGNOSIS — Z87891 Personal history of nicotine dependence: Secondary | ICD-10-CM

## 2014-05-22 DIAGNOSIS — Z8249 Family history of ischemic heart disease and other diseases of the circulatory system: Secondary | ICD-10-CM | POA: Diagnosis not present

## 2014-05-22 DIAGNOSIS — I2510 Atherosclerotic heart disease of native coronary artery without angina pectoris: Secondary | ICD-10-CM | POA: Diagnosis present

## 2014-05-22 DIAGNOSIS — E785 Hyperlipidemia, unspecified: Secondary | ICD-10-CM | POA: Diagnosis present

## 2014-05-22 DIAGNOSIS — I252 Old myocardial infarction: Secondary | ICD-10-CM

## 2014-05-22 DIAGNOSIS — Z23 Encounter for immunization: Secondary | ICD-10-CM

## 2014-05-22 DIAGNOSIS — T45515A Adverse effect of anticoagulants, initial encounter: Secondary | ICD-10-CM | POA: Diagnosis not present

## 2014-05-22 DIAGNOSIS — I480 Paroxysmal atrial fibrillation: Secondary | ICD-10-CM

## 2014-05-22 DIAGNOSIS — IMO0002 Reserved for concepts with insufficient information to code with codable children: Secondary | ICD-10-CM | POA: Diagnosis not present

## 2014-05-22 DIAGNOSIS — M4716 Other spondylosis with myelopathy, lumbar region: Secondary | ICD-10-CM

## 2014-05-22 DIAGNOSIS — F419 Anxiety disorder, unspecified: Secondary | ICD-10-CM

## 2014-05-22 DIAGNOSIS — T45525A Adverse effect of antithrombotic drugs, initial encounter: Secondary | ICD-10-CM | POA: Diagnosis not present

## 2014-05-22 LAB — COMPREHENSIVE METABOLIC PANEL
ALBUMIN: 3.7 g/dL (ref 3.5–5.2)
ALT: 8 U/L (ref 0–53)
AST: 17 U/L (ref 0–37)
Alkaline Phosphatase: 95 U/L (ref 39–117)
Anion gap: 9 (ref 5–15)
BUN: 17 mg/dL (ref 6–23)
CALCIUM: 9.1 mg/dL (ref 8.4–10.5)
CO2: 32 mEq/L (ref 19–32)
Chloride: 96 mEq/L (ref 96–112)
Creatinine, Ser: 0.93 mg/dL (ref 0.50–1.35)
GFR calc Af Amer: 87 mL/min — ABNORMAL LOW (ref >90)
GFR calc non Af Amer: 75 mL/min — ABNORMAL LOW (ref >90)
Glucose, Bld: 99 mg/dL (ref 70–99)
Potassium: 3.6 mEq/L — ABNORMAL LOW (ref 3.7–5.3)
SODIUM: 137 meq/L (ref 137–147)
Total Bilirubin: 0.9 mg/dL (ref 0.3–1.2)
Total Protein: 8 g/dL (ref 6.0–8.3)

## 2014-05-22 LAB — CBC
HCT: 29 % — ABNORMAL LOW (ref 39.0–52.0)
Hemoglobin: 9.3 g/dL — ABNORMAL LOW (ref 13.0–17.0)
MCH: 31.7 pg (ref 26.0–34.0)
MCHC: 32.1 g/dL (ref 30.0–36.0)
MCV: 99 fL (ref 78.0–100.0)
Platelets: 205 10*3/uL (ref 150–400)
RBC: 2.93 MIL/uL — ABNORMAL LOW (ref 4.22–5.81)
RDW: 16.9 % — ABNORMAL HIGH (ref 11.5–15.5)
WBC: 5.2 10*3/uL (ref 4.0–10.5)

## 2014-05-22 LAB — PROTIME-INR
INR: 2.64 — AB (ref 0.00–1.49)
Prothrombin Time: 28.2 seconds — ABNORMAL HIGH (ref 11.6–15.2)

## 2014-05-22 LAB — I-STAT TROPONIN, ED: Troponin i, poc: 0.01 ng/mL (ref 0.00–0.08)

## 2014-05-22 LAB — DIGOXIN LEVEL: Digoxin Level: 1.1 ng/mL (ref 0.8–2.0)

## 2014-05-22 MED ORDER — CLOPIDOGREL BISULFATE 75 MG PO TABS
75.0000 mg | ORAL_TABLET | Freq: Every day | ORAL | Status: DC
Start: 2014-05-23 — End: 2014-05-29
  Administered 2014-05-23 – 2014-05-28 (×6): 75 mg via ORAL
  Filled 2014-05-22 (×9): qty 1

## 2014-05-22 MED ORDER — ALPRAZOLAM 0.25 MG PO TABS
0.2500 mg | ORAL_TABLET | Freq: Every evening | ORAL | Status: DC | PRN
  Administered 2014-05-25 – 2014-06-02 (×3): 0.25 mg via ORAL
  Filled 2014-05-22 (×4): qty 1

## 2014-05-22 MED ORDER — FENTANYL 25 MCG/HR TD PT72: 25.0000 ug | MEDICATED_PATCH | TRANSDERMAL | Status: DC

## 2014-05-22 MED ORDER — MOMETASONE FURO-FORMOTEROL FUM 100-5 MCG/ACT IN AERO
2.0000 | INHALATION_SPRAY | Freq: Two times a day (BID) | RESPIRATORY_TRACT | Status: DC
  Administered 2014-05-23 – 2014-06-04 (×24): 2 via RESPIRATORY_TRACT
  Filled 2014-05-22: qty 8.8

## 2014-05-22 MED ORDER — SACCHAROMYCES BOULARDII 250 MG PO CAPS
250.0000 mg | ORAL_CAPSULE | Freq: Two times a day (BID) | ORAL | Status: DC
  Administered 2014-05-23 – 2014-06-04 (×25): 250 mg via ORAL
  Filled 2014-05-22 (×26): qty 1

## 2014-05-22 MED ORDER — ALBUTEROL SULFATE (2.5 MG/3ML) 0.083% IN NEBU: 2.5000 mg | INHALATION_SOLUTION | Freq: Four times a day (QID) | RESPIRATORY_TRACT | Status: DC | PRN

## 2014-05-22 MED ORDER — LEVOTHYROXINE SODIUM 88 MCG PO TABS
88.0000 ug | ORAL_TABLET | Freq: Every day | ORAL | Status: DC
  Administered 2014-05-23 – 2014-06-04 (×13): 88 ug via ORAL
  Filled 2014-05-22 (×14): qty 1

## 2014-05-22 MED ORDER — LUBIPROSTONE 24 MCG PO CAPS
24.0000 ug | ORAL_CAPSULE | Freq: Two times a day (BID) | ORAL | Status: DC
  Administered 2014-05-23 – 2014-06-04 (×24): 24 ug via ORAL
  Filled 2014-05-22 (×28): qty 1

## 2014-05-22 MED ORDER — NITROGLYCERIN 0.4 MG SL SUBL: 0.4000 mg | SUBLINGUAL_TABLET | SUBLINGUAL | Status: DC | PRN

## 2014-05-22 MED ORDER — NIACIN ER (ANTIHYPERLIPIDEMIC) 500 MG PO TBCR
500.0000 mg | EXTENDED_RELEASE_TABLET | Freq: Every day | ORAL | Status: DC
  Administered 2014-05-23 – 2014-06-03 (×12): 500 mg via ORAL
  Filled 2014-05-22 (×14): qty 1

## 2014-05-22 MED ORDER — GABAPENTIN 300 MG PO CAPS: 600.0000 mg | ORAL_CAPSULE | Freq: Two times a day (BID) | ORAL | Status: DC

## 2014-05-22 MED ORDER — AZITHROMYCIN 500 MG PO TABS
500.0000 mg | ORAL_TABLET | ORAL | Status: AC
  Administered 2014-05-23 – 2014-05-29 (×7): 500 mg via ORAL
  Filled 2014-05-22 (×8): qty 1

## 2014-05-22 MED ORDER — SIMVASTATIN 10 MG PO TABS
10.0000 mg | ORAL_TABLET | Freq: Every day | ORAL | Status: DC
  Administered 2014-05-23 – 2014-06-03 (×12): 10 mg via ORAL
  Filled 2014-05-22 (×13): qty 1

## 2014-05-22 MED ORDER — DEXTROSE 5 % IV SOLN
1.0000 g | INTRAVENOUS | Status: AC
  Administered 2014-05-23 – 2014-05-29 (×7): 1 g via INTRAVENOUS
  Filled 2014-05-22 (×7): qty 10

## 2014-05-22 MED ORDER — AZITHROMYCIN 500 MG PO TABS: 500.0000 mg | ORAL_TABLET | ORAL | Status: DC

## 2014-05-22 MED ORDER — GUAIFENESIN ER 600 MG PO TB12
600.0000 mg | ORAL_TABLET | Freq: Two times a day (BID) | ORAL | Status: DC
  Administered 2014-05-23 – 2014-06-04 (×25): 600 mg via ORAL
  Filled 2014-05-22 (×26): qty 1

## 2014-05-22 MED ORDER — ISOSORBIDE MONONITRATE ER 60 MG PO TB24
60.0000 mg | ORAL_TABLET | Freq: Every day | ORAL | Status: DC
  Administered 2014-05-23 – 2014-06-04 (×13): 60 mg via ORAL
  Filled 2014-05-22 (×13): qty 1

## 2014-05-22 MED ORDER — ALBUTEROL SULFATE HFA 108 (90 BASE) MCG/ACT IN AERS: 2.0000 | INHALATION_SPRAY | Freq: Four times a day (QID) | RESPIRATORY_TRACT | Status: DC | PRN

## 2014-05-22 MED ORDER — DIGOXIN 125 MCG PO TABS
0.1250 mg | ORAL_TABLET | Freq: Every day | ORAL | Status: DC
  Administered 2014-05-23 – 2014-06-04 (×13): 0.125 mg via ORAL
  Filled 2014-05-22 (×13): qty 1

## 2014-05-22 MED ORDER — IOHEXOL 350 MG/ML SOLN
100.0000 mL | Freq: Once | INTRAVENOUS | Status: AC | PRN
  Administered 2014-05-22: 100 mL via INTRAVENOUS

## 2014-05-22 MED ORDER — FENTANYL 25 MCG/HR TD PT72
25.0000 ug | MEDICATED_PATCH | TRANSDERMAL | Status: DC
  Administered 2014-05-23 – 2014-06-04 (×5): 25 ug via TRANSDERMAL
  Filled 2014-05-22 (×5): qty 1

## 2014-05-22 MED ORDER — GABAPENTIN 300 MG PO CAPS
600.0000 mg | ORAL_CAPSULE | Freq: Two times a day (BID) | ORAL | Status: DC
  Administered 2014-05-23 – 2014-06-04 (×25): 600 mg via ORAL
  Filled 2014-05-22 (×26): qty 2

## 2014-05-22 MED ORDER — CARVEDILOL 3.125 MG PO TABS
3.1250 mg | ORAL_TABLET | Freq: Two times a day (BID) | ORAL | Status: DC
  Administered 2014-05-23 – 2014-06-04 (×25): 3.125 mg via ORAL
  Filled 2014-05-22 (×28): qty 1

## 2014-05-22 MED ORDER — DEXTROSE 5 % IV SOLN
500.0000 mg | Freq: Once | INTRAVENOUS | Status: AC
  Administered 2014-05-22: 500 mg via INTRAVENOUS
  Filled 2014-05-22: qty 500

## 2014-05-22 MED ORDER — INFLUENZA VAC SPLIT QUAD 0.5 ML IM SUSY
0.5000 mL | PREFILLED_SYRINGE | INTRAMUSCULAR | Status: AC
  Administered 2014-05-23: 0.5 mL via INTRAMUSCULAR
  Filled 2014-05-22: qty 0.5

## 2014-05-22 MED ORDER — DEXTROSE 5 % IV SOLN
1.0000 g | INTRAVENOUS | Status: DC
  Administered 2014-05-22: 1 g via INTRAVENOUS
  Filled 2014-05-22: qty 10

## 2014-05-22 MED ORDER — DEXTROSE 5 % IV SOLN: 1.0000 g | INTRAVENOUS | Status: DC

## 2014-05-22 MED ORDER — SODIUM CHLORIDE 0.9 % IV SOLN
INTRAVENOUS | Status: DC
  Administered 2014-05-22: via INTRAVENOUS

## 2014-05-22 MED ORDER — CALCIUM CARBONATE-VITAMIN D 500-200 MG-UNIT PO TABS
1.0000 | ORAL_TABLET | Freq: Every day | ORAL | Status: DC
  Administered 2014-05-23 – 2014-06-04 (×13): 1 via ORAL
  Filled 2014-05-22 (×14): qty 1

## 2014-05-22 MED ORDER — DEXTROSE 5 % IV SOLN
1.0000 g | INTRAVENOUS | Status: DC
  Filled 2014-05-22: qty 10

## 2014-05-22 MED ORDER — IPRATROPIUM BROMIDE 0.02 % IN SOLN
0.5000 mg | Freq: Four times a day (QID) | RESPIRATORY_TRACT | Status: DC
Start: 2014-05-22 — End: 2014-05-23
  Administered 2014-05-23 (×3): 0.5 mg via RESPIRATORY_TRACT
  Filled 2014-05-22 (×3): qty 2.5

## 2014-05-22 NOTE — ED Notes (Signed)
Attempted report 

## 2014-05-22 NOTE — ED Notes (Signed)
Pt presents from home with c/o increased SOB over the past few weeks. Pt with hx emphysema and COPD.  Pt's respirations slightly labored, on 2.5L [place] at home and states normal SpO2 is in the 80s.  Pt able to speak in complete sentences.

## 2014-05-22 NOTE — ED Notes (Signed)
Ambulated patient in room with 2.5L [place] (pt has 2.5L[place] at home all the time). Pt SpO2 dropped to 85%. Pt became very short of breath and is now sitting up in chair, declines to get back into bed.  SpO2 91% at rest.

## 2014-05-22 NOTE — H&P (Signed)
[facility] History and Physical  Travis Bond ZOX:[id number] DOB: 1926-11-04 DOA: 05/22/2014  Referring physician: ED physician PCP: Carollee Sires, MD  Specialists:   Chief Complaint: Hemoptysis  HPI: Travis Bond is a 80 y.o. male with PMH of CKD-III, COPD,  A. fib on coumadin, coronary artery disease (post status of stent placement 5 years ago), GERD, aortic stenosis, who presents with shortness of breath and hemoptysis.  Patient reports that he was treated for pneumonia as outpatient 4 weeks ago. He received 7 days of antibiotics (not remember the name). He feels like he has not completely recovered from recent pneumonia. 2 weeks ago, he started coughing up small amount of blood. Sometimes the blood is black and other time is bright red. He does not have fever, but does feel cold. He has a burning-like chest pain, which is pleuritic. Deep breath makes his chest pain worse. He reports his baseline shortness of breath is getting worse recently. He uses 2.5 L of oxygen at home. One time, he had oxygen desaturation to 70%. He reports that he lost 20 pounds over the past 6 months because of poor appetite. Because of the worsening shortness of breath and hemoptysis, he came to the ED for further evaluation and treatment.  CTA showed no evidence of pulmonary embolism, but has reticular nodular infiltrate which suggests pneumonia per radiologist. Patient has a slight drop of hemoglobin from 11.1 on 02/20/14 to 9.3 on admission. Patient is admitted to inpatient for further evaluation and treatment.  Review of Systems: As presented in the history of presenting illness, rest negative.  Where does patient live?  Lives with his daughter in [place] Can patient participate in ADLs? barely  Allergy: No Known Allergies  Past Medical History  Diagnosis Date  . COPD (chronic obstructive pulmonary disease)   . Chronic respiratory failure with hypoxia   . Pulmonary fibrosis   . Coronary  artery disease   . Hypertension   . Hyperlipidemia   . Carotid stenosis   . Peripheral vascular disease   . Anemia   . Peripheral neuropathy   . B12 deficiency   . Pneumonia   . Atrial fibrillation   . Hypothyroidism   . Anxiety state, unspecified   . Unspecified deficiency anemia   . Abdominal aneurysm without mention of rupture   . Aortic valve disorders   . Type II or unspecified type diabetes mellitus without mention of complication, not stated as uncontrolled   . Unspecified hereditary and idiopathic peripheral neuropathy   . Microscopic hematuria   . Chronic kidney disease     Kidney stones    Past Surgical History  Procedure Laterality Date  . Inguinal hernia repair  1994  . Cardioversion  09/21/2011    Procedure: CARDIOVERSION;  Surgeon: Darden Palmer., MD;  Location: [facility] OR;  Service: Cardiovascular;  Laterality: N/A;  . Cardiac stent  2010 or 2011 pt not sure  . Hernia repair  1989    Social History:  reports that he quit smoking about 4 years ago. His smoking use included Cigarettes. He has a 65 pack-year smoking history. He has never used smokeless tobacco. He reports that he does not drink alcohol or use illicit drugs.  Family History:  Family History  Problem Relation Age of Onset  . Heart failure Father   . Heart failure Mother   . Diabetes Mother   . Coronary artery disease Brother   . Diabetes Brother   . Heart disease Brother   .  Hypertension Brother      Prior to Admission medications   Medication Sig Start Date End Date Taking? Authorizing Provider  albuterol (PROVENTIL HFA;VENTOLIN HFA) 108 (90 BASE) MCG/ACT inhaler Inhale 2 puffs into the lungs every 6 (six) hours as needed for wheezing or shortness of breath.   Yes Historical Provider, MD  albuterol (PROVENTIL) (2.5 MG/3ML) 0.083% nebulizer solution Take 2.5 mg by nebulization every 6 (six) hours as needed for wheezing or shortness of breath.   Yes Historical Provider, MD  ALPRAZolam Prudy Feeler)  0.25 MG tablet Take 0.25 mg by mouth at bedtime as needed for anxiety.   Yes Historical Provider, MD  calcium-vitamin D (OSCAL WITH D) 500-200 MG-UNIT per tablet Take 1 tablet by mouth daily with breakfast.   Yes Historical Provider, MD  carvedilol (COREG) 3.125 MG tablet Take 3.125 mg by mouth 2 (two) times daily with a meal.  10/26/13  Yes Historical Provider, MD  clopidogrel (PLAVIX) 75 MG tablet Take 75 mg by mouth daily.     Yes Historical Provider, MD  digoxin (LANOXIN) 0.125 MG tablet Take 0.125 mg by mouth daily.   Yes Historical Provider, MD  fentaNYL (DURAGESIC - DOSED MCG/HR) 25 MCG/HR patch Place 25 mcg onto the skin every 3 (three) days.   Yes Historical Provider, MD  Fluticasone-Salmeterol (ADVAIR) 250-50 MCG/DOSE AEPB Inhale 1 puff into the lungs 2 (two) times daily.   Yes Historical Provider, MD  furosemide (LASIX) 20 MG tablet Take 20 mg by mouth 2 (two) times daily.   Yes Historical Provider, MD  gabapentin (NEURONTIN) 300 MG capsule Take 600 mg by mouth 2 (two) times daily.   Yes Historical Provider, MD  guaiFENesin (MUCINEX) 600 MG 12 hr tablet Take 600 mg by mouth 2 (two) times daily.   Yes Historical Provider, MD  ipratropium (ATROVENT) 0.02 % nebulizer solution Take 0.5 mg by nebulization 4 (four) times daily.   Yes Historical Provider, MD  isosorbide mononitrate (IMDUR) 60 MG 24 hr tablet Take 60 mg by mouth daily.     Yes Historical Provider, MD  levothyroxine (SYNTHROID, LEVOTHROID) 88 MCG tablet Take 88 mcg by mouth daily before breakfast.   Yes Historical Provider, MD  lubiprostone (AMITIZA) 24 MCG capsule Take 24 mcg by mouth 2 (two) times daily with a meal.   Yes Historical Provider, MD  niacin (NIASPAN) 500 MG CR tablet Take 500 mg by mouth at bedtime.     Yes Historical Provider, MD  nitroGLYCERIN (NITROSTAT) 0.4 MG SL tablet Place 0.4 mg under the tongue every 5 (five) minutes as needed for chest pain.   Yes Historical Provider, MD  pravastatin (PRAVACHOL) 20 MG tablet  Take 20 mg by mouth daily.    Yes Historical Provider, MD  saccharomyces boulardii (FLORASTOR) 250 MG capsule Take 250 mg by mouth 2 (two) times daily.   Yes Historical Provider, MD  warfarin (COUMADIN) 3 MG tablet Take 3-4.5 mg by mouth daily. Take 4.5 on Tuesday ONLY, all other days take  including Sunday   Yes Historical Provider, MD  moxifloxacin (AVELOX) 400 MG tablet Take 400 mg by mouth daily at 8 pm. x7 days    Historical Provider, MD    Physical Exam: Filed Vitals:   05/22/14 2130 05/22/14 2300 05/22/14 2337 05/23/14 0100  BP: 157/63 132/55 144/100 150/74  Pulse: 78 87 99   Temp:   97.5 F (36.4 C)   TempSrc:   Oral   Resp: Height:   5'  10.5" (1.791 m)   Weight:   62.778 kg (138 lb 6.4 oz)   SpO2: 97% 99% 92%    General: Not in acute distress HEENT:       Eyes: PERRL, EOMI, no scleral icterus       ENT: No discharge from the ears and nose, no pharynx injection, no tonsillar enlargement.        Neck: No JVD, no bruit, no mass felt. Cardiac: S1/S2, irregular rhythm. No murmurs, gallops or rubs Pulm: decreased air movement bilaterally. Has wheezing bilaterally, No rales or rubs. Abd: Soft, nondistended, nontender, no rebound pain, no organomegaly, BS present Ext: No edema. 2+DP/PT pulse bilaterally Musculoskeletal: No joint deformities, erythema, or stiffness, ROM full Skin: No rashes.  Neuro: Alert and oriented X3, cranial nerves II-XII grossly intact, muscle strength 5/5 in all extremeties, sensation to light touch intact.  Psych: Patient is not psychotic, no suicidal or hemocidal ideation.  Labs on Admission:  Basic Metabolic Panel:  Recent Labs Lab 05/22/14 1825 05/23/14 0045  NA 137 137  K 3.6* 3.6*  CL 96 97  CO2 32 31  GLUCOSE 99 138*  BUN 17 14  CREATININE 0.93 0.82  CALCIUM 9.1 8.7   Liver Function Tests:  Recent Labs Lab 05/22/14 1825 05/23/14 0045  AST 17 17  ALT 8 8  ALKPHOS 95 88  BILITOT 0.9 0.8  PROT 8.0 7.4  ALBUMIN 3.7  3.4*   No results found for this basename: LIPASE, AMYLASE,  in the last 168 hours No results found for this basename: AMMONIA,  in the last 168 hours CBC:  Recent Labs Lab 05/22/14 1825 05/23/14 0045  WBC 5.2 4.8  HGB 9.3* 9.4*  HCT 29.0* 28.8*  MCV 99.0 99.3  PLT 205 210   Cardiac Enzymes:  Recent Labs Lab 05/23/14 0045  TROPONINI <0.30    BNP (last 3 results) No results found for this basename: PROBNP,  in the last 8760 hours CBG: No results found for this basename: GLUCAP,  in the last 168 hours  Radiological Exams on Admission: Dg Chest 2 View  05/22/2014   CLINICAL DATA:  COPD  EXAM: CHEST  2 VIEW  COMPARISON:  04/25/2014, CT from earlier in the same day.  FINDINGS: Cardiac shadow is stable. Lungs remain hyperinflated. Fibrotic changes are again noted in the mid and lower lung fields bilaterally. No definitive acute infiltrate is seen. The nodular changes seen on the recent chest CT are less well appreciated on this exam.  IMPRESSION: COPD and chronic changes. The recently noted nodular changes on CT are not well appreciated on this exam.   Electronically Signed   By: Alcide Clever M.D.   On: 05/22/2014 20:57   Ct Angio Chest W/cm &/or Wo Cm  05/22/2014   CLINICAL DATA:  Chest pain and shortness of breath getting worse, hemoptysis  EXAM: CT ANGIOGRAPHY CHEST WITH CONTRAST  TECHNIQUE: Multidetector CT imaging of the chest was performed using the standard protocol during bolus administration of intravenous contrast. Multiplanar CT image reconstructions and MIPs were obtained to evaluate the vascular anatomy.  CONTRAST:  OMNIPAQUE IOHEXOL 350 MG/ML SOLN  COMPARISON:  CT abdomen pelvis 11/14/2012 and CT thorax 11/26/2009  FINDINGS: Severe diffuse COPD again identified. Peripheral reticular opacities in the mid to lower lung zones bilaterally, similar to prior study suggesting interstitial pulmonary fibrosis. More confluent airspace opacities posteriorly bilaterally suggesting  atelectasis. In the superior segment of the right lower lobe there is more confluent reticular nodular infiltrate,  with several prominent nodules measuring up to about 15 mm in the superior aspect of the superior segment. This process can 10 use into the central right lower lobe.  There are no filling defects in the pulmonary arterial system. There is calcification of the thoracic aorta. No pleural or pericardial effusion. No significant hilar or mediastinal adenopathy.  Images of the upper abdomen demonstrate numerous bilateral renal lesions measuring between 1 and 4 cm, too numerous to count, not characterized on this study, as contrast has not reached the viscera. There is infrarenal abdominal aortic aneurysm to a diameter of at least 43 x 45 mm.  There is approximately 25% compression deformity of L2. There is an acute appearing vertical fracture line. There is no evidence of significant retropulsion. This is new from 11/14/2012.  Review of the MIP images confirms the above findings.  IMPRESSION: 1. No evidence pulmonary embolism. 2. Superimposed on severe COPD and findings chronic interstitial pulmonary fibrosis, there is reticular nodular infiltrate throughout the right lower lobe particularly in the superior segment. In the superior segment there also more confluent nodular opacities. These findings suggest pneumonia. CT follow-up after appropriate therapy recommended to ensure resolution, particularly of the nodular densities. 3. Known infrarenal abdominal aortic aneurysm 4. Numerous renal lesions, not fully characterized by this study, but there appearance is not significantly different from 11/14/2012 CT abdomen and pelvis at which time they appeared consistent with cysts. 5. Mild but acute appearing L2 compression deformity.   Electronically Signed   By: Esperanza Heir M.D.   On: 05/22/2014 20:52   Assessment/Plan Principal Problem:   Community acquired pneumonia Active Problems:   C O P D   CAD  (coronary artery disease)   Atrial fibrillation   Hyperlipidemia   Drug eluting stent    CAP (community acquired pneumonia)   Protein-calorie malnutrition  1. community-acquired pneumonia: As evidenced by CTA. PE was ruled out by CTA. Patient was not hospitalized in the past 3 months. The comminuted acquired pneumonia his most likely diagnosis. Patient does not have leukocytosis and fever. It is likely that patient cannot mount an appropriate response to the infection due to multiple comorbidities.  - will admitted to the telemetry bed - IV Rocephin plus azithromycin orally - Will send Sputum for Gram stain and culture  - Obtain Blood Cultures x2  - Send urine for legionella and strep pneumo antigens  - Tylenol for fever   2. COPD: Patient does not seem to have COPD exacerbation given no significant sputum production except for hemoptysis. - Breathing treatments: Albuterol and Atrovent nebulizers  3. protein-calorie malnutrition: Patient lost more than 20 pounds over 6 months -Start Ensure  4. coronary artery disease: Patient had MI 5 years ago. S/p of stent placement. His chest pain is most likely due to pneumonia, rather than angina given pleuritic nature - Continue home medications - Troponin x3 -  EKG - Continue home medications: Coreg, Plavix, isosorbide mononitrate, Zocor  5. atrial fibrillation: Heart rate is well controlled. On Coumadin with an INR 2.64. Because of hemoptysis, I suggested to hold Coumadin. After discussion with patient about risk of stroke, patient would like to continue Coumadin and discuss with his cardiologist, Dr. Donnie Aho. - will continue Coumadin tonight - Will discontinue if patient develops more severe hemoptysis - Need to discuss with Dr. Donnie Aho per patient's requirement in AM.  6. Anemia: slight drop of hgb from 11.1 to 9.3 - check anemia panel - cvc q12h  DVT ppx:  on Coumadin  with therapeutic INR Code Status: Full code Family Communication:  Yes, patient's daughter at bed side Disposition Plan: Admit to inpatient  Lorretta Harp [facility] Pager [phone]  If 7PM-7AM, please contact night-coverage [email or web address] Password [facility] 05/23/2014, 3:51 AM

## 2014-05-22 NOTE — ED Notes (Signed)
Contacted CT regarding delay. 

## 2014-05-22 NOTE — ED Provider Notes (Signed)
CSN: [id number]     Arrival date & time 05/22/14  1742 History   First MD Initiated Contact with Patient 05/22/14 1743     Chief Complaint  Patient presents with  . Shortness of Breath    Patient is a 80 y.o. male presenting with shortness of breath. The history is provided by the patient.  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  On moxifloxacin for PNA dx last week.  Compliant with coumadin.  Does not use inhalers very frequently.   Past Medical History  Diagnosis Date  . COPD (chronic obstructive pulmonary disease)   . Chronic respiratory failure with hypoxia   . Pulmonary fibrosis   . Coronary artery disease   . Hypertension   . Hyperlipidemia   . Carotid stenosis   . Peripheral vascular disease   . Anemia   . Peripheral neuropathy   . B12 deficiency   . Pneumonia   . Atrial fibrillation   . Hypothyroidism   . Anxiety state, unspecified   . Unspecified deficiency anemia   . Abdominal aneurysm without mention of rupture   . Aortic valve disorders   . Type II or unspecified type diabetes mellitus without mention of complication, not stated as uncontrolled   . Unspecified hereditary and idiopathic peripheral neuropathy   . Microscopic hematuria   . Chronic kidney disease     Kidney stones   Past Surgical History  Procedure Laterality Date  . Inguinal hernia repair  1994  . Cardioversion  09/21/2011    Procedure: CARDIOVERSION;  Surgeon: Darden Palmer., MD;  Location: [facility]  OR;  Service: Cardiovascular;  Laterality: N/A;  . Cardiac stent  2010 or 2011 pt not sure  . Hernia repair  1989   Family History  Problem Relation Age of Onset  . Heart failure Father   . Heart failure Mother   . Diabetes Mother   . Coronary artery disease Brother   . Diabetes Brother   . Heart disease Brother   . Hypertension Brother    History  Substance Use Topics  . Smoking status: Former Smoker -- 1.00 packs/day for 65 years    Types: Cigarettes    Quit date: 01/18/2010  . Smokeless tobacco: Never Used  . Alcohol Use: No    Review of Systems  Constitutional: Negative for diaphoresis.  Respiratory: Positive for cough, hemoptysis (teaspoon mixed in with sputum 6x per day), sputum production and shortness of breath. Negative for wheezing.   Cardiovascular: Negative for chest pain (no pleurisy).  Gastrointestinal: Negative for vomiting and abdominal pain.      Allergies  Review of patient's allergies indicates no known allergies.  Home Medications   Prior to Admission medications   Medication Sig Start Date End Date Taking? Authorizing Provider  albuterol (PROVENTIL HFA) 108 (90 BASE) MCG/ACT inhaler Take 1-2 puffs by mouth every 4-6 hours as needed for wheezing and shortness of breath 05/17/13   Oneal Grout, MD  albuterol (PROVENTIL) (2.5 MG/3ML) 0.083% nebulizer solution  USE ONE VIAL VIA NEBULIZER 4 TIMES DAILY  FOR WHEEZING AND SHORTNESS OF BREATH 03/05/14   Coralyn Helling, MD  ALPRAZolam (XANAX) 0.25 MG tablet TAKE ONE TABLET BY MOUTH NIGHTLY AT BEDTIME 04/25/14   Oneal Grout, MD  calcium-vitamin D (OSCAL 500/200 D-3) 500-200 MG-UNIT per tablet Take 1 tablet by mouth 2 (two) times daily. 08/15/13   Oneal Grout, MD  carvedilol (COREG) 3.125 MG tablet  10/26/13   Historical Provider, MD  clopidogrel (PLAVIX) 75 MG tablet Take 75 mg by mouth daily.      Historical Provider, MD  digoxin (LANOXIN) 0.125 MG tablet Take 1 tablet (0.125 mg total) by mouth daily. Provided by  dr York Spaniel office- cardiology 12/19/13 05/01/15  Oneal Grout, MD  fentaNYL (DURAGESIC - DOSED MCG/HR) 25 MCG/HR patch Place 1 patch (25 mcg total) onto the skin every 3 (three) days. 03/20/14   Oneal Grout, MD  Fluticasone-Salmeterol (ADVAIR DISKUS) 250-50 MCG/DOSE AEPB Inhale 1 puff into the lungs 2 (two) times daily. 04/25/14   Oneal Grout, MD  furosemide (LASIX) 20 MG tablet Take one tablet twice a day 04/25/14   Oneal Grout, MD  gabapentin (NEURONTIN) 300 MG capsule TAKE 2 CAPSULES TWICE DAILY FOR PAIN 02/16/14   Monina C Medina-Vargas, NP  guaiFENesin (MUCINEX) 600 MG 12 hr tablet Take 600 mg by mouth 2 (two) times daily.    Historical Provider, MD  ipratropium (ATROVENT) 0.02 % nebulizer solution Take 2.5 mLs (500 mcg total) by nebulization 3 (three) times daily. 02/14/13   Tammy S Parrett, NP  isosorbide mononitrate (IMDUR) 60 MG 24 hr tablet Take 60 mg by mouth daily.      Historical Provider, MD  levothyroxine (SYNTHROID, LEVOTHROID) 88 MCG tablet TAKE 1 TABLET DAILY BEFORE BREAKFAST for your thyroid problem 12/19/13   Oneal Grout, MD  lubiprostone (AMITIZA) 24 MCG capsule TAKE 1 CAPSULE TWICE DAILY WITH MEALS 04/25/14   Oneal Grout, MD  moxifloxacin (AVELOX) 400 MG tablet Take one tablet by mouth once daily for one week 04/26/14   Kermit Balo, DO  niacin (NIASPAN) 500 MG CR tablet Take 500 mg by mouth at bedtime.      Historical Provider, MD  nitroGLYCERIN (NITROSTAT) 0.4 MG SL tablet Place 1 tablet (0.4 mg total) under the tongue as needed. Chest pain 05/17/13   Oneal Grout, MD  pravastatin (PRAVACHOL) 20 MG tablet Take 20 mg by mouth. 1 BY MOUTH AT BEDTIME     Historical Provider, MD  saccharomyces boulardii (FLORASTOR) 250 MG capsule Take one tablet by mouth twice daily for 2 weeks 04/26/14   Tiffany L Reed, DO  Tdap (BOOSTRIX) 5-2.5-18.5 LF-MCG/0.5 injection Inject 0.5 mLs into the muscle once. 04/25/14   Oneal Grout, MD  warfarin (COUMADIN) 3 MG tablet Take 3 mg by mouth daily.     Historical Provider, MD   BP 147/80  Pulse 94  Temp(Src) 98 F (36.7 C)  Resp 20  SpO2 91% Physical Exam  Nursing note and vitals reviewed. Constitutional: He is oriented to person, place, and time.  Elderly frail  HENT:  Head: Normocephalic and atraumatic.  Nose: Nose normal.  Clear oropharynx, no lesions, exudates, erythema or source of bleeding  Eyes: Conjunctivae are normal.  Neck: Normal range of motion. Neck supple. No JVD present. No tracheal deviation present.  Cardiovascular: Normal rate, regular rhythm and normal heart sounds.   No murmur heard. Pulmonary/Chest: No respiratory distress. He has no rales. He exhibits no tenderness.  Poor air entry diffusely. Normal  exp phase, no wheezes.   Abdominal: Soft. Bowel sounds are normal. He exhibits no distension and no mass. There is no tenderness.  Musculoskeletal: Normal range of motion. He exhibits edema (trace bilaterally equal).  No calf tenderness, warmth, erythema or palpable cords    Neurological: He is alert and oriented to person, place, and time.  Skin: Skin is warm and dry. No rash noted.  Psychiatric: He has a normal mood and affect.    ED Course  Procedures (including critical care time) Labs Review Labs Reviewed  CBC - Abnormal; Notable for the following:    RBC 2.93 (*)    Hemoglobin 9.3 (*)    HCT 29.0 (*)    RDW 16.9 (*)    All other components within normal limits  COMPREHENSIVE METABOLIC PANEL - Abnormal; Notable for the following:    Potassium 3.6 (*)    GFR calc non Af Amer 75 (*)    GFR calc Af Amer 87 (*)    All other components within normal limits  PROTIME-INR - Abnormal; Notable for the following:    Prothrombin Time 28.2 (*)    INR 2.64 (*)    All other components within normal limits  DIGOXIN LEVEL  I-STAT TROPOININ, ED    Imaging Review Dg Chest 2 View  05/22/2014   CLINICAL DATA:  COPD  EXAM: CHEST  2 VIEW  COMPARISON:  04/25/2014, CT from earlier in the same day.  FINDINGS:  Cardiac shadow is stable. Lungs remain hyperinflated. Fibrotic changes are again noted in the mid and lower lung fields bilaterally. No definitive acute infiltrate is seen. The nodular changes seen on the recent chest CT are less well appreciated on this exam.  IMPRESSION: COPD and chronic changes. The recently noted nodular changes on CT are not well appreciated on this exam.   Electronically Signed   By: Alcide Clever M.D.   On: 05/22/2014 20:57   Ct Angio Chest W/cm &/or Wo Cm  05/22/2014   CLINICAL DATA:  Chest pain and shortness of breath getting worse, hemoptysis  EXAM: CT ANGIOGRAPHY CHEST WITH CONTRAST  TECHNIQUE: Multidetector CT imaging of the chest was performed using the standard protocol during bolus administration of intravenous contrast. Multiplanar CT image reconstructions and MIPs were obtained to evaluate the vascular anatomy.  CONTRAST:  OMNIPAQUE IOHEXOL 350 MG/ML SOLN  COMPARISON:  CT abdomen pelvis 11/14/2012 and CT thorax 11/26/2009  FINDINGS: Severe diffuse COPD again identified. Peripheral reticular opacities in the mid to lower lung zones bilaterally, similar to prior study suggesting interstitial pulmonary fibrosis. More confluent airspace opacities posteriorly bilaterally suggesting atelectasis. In the superior segment of the right lower lobe there is more confluent reticular nodular infiltrate, with several prominent nodules measuring up to about 15 mm in the superior aspect of the superior segment. This process can 10 use into the central right lower lobe.  There are no filling defects in the pulmonary arterial system. There is calcification of the thoracic aorta. No pleural or pericardial effusion. No significant hilar or mediastinal adenopathy.  Images of the upper abdomen demonstrate numerous bilateral renal lesions measuring between 1 and 4 cm, too numerous to count, not characterized on this study, as contrast has not reached the viscera. There is infrarenal abdominal  aortic aneurysm to a diameter of at least 43 x 45 mm.  There is approximately 25% compression deformity of L2. There is an acute appearing vertical fracture line. There is no evidence of significant retropulsion. This is new from  11/14/2012.  Review of the MIP images confirms the above findings.  IMPRESSION: 1. No evidence pulmonary embolism. 2. Superimposed on severe COPD and findings chronic interstitial pulmonary fibrosis, there is reticular nodular infiltrate throughout the right lower lobe particularly in the superior segment. In the superior segment there also more confluent nodular opacities. These findings suggest pneumonia. CT follow-up after appropriate therapy recommended to ensure resolution, particularly of the nodular densities. 3. Known infrarenal abdominal aortic aneurysm 4. Numerous renal lesions, not fully characterized by this study, but there appearance is not significantly different from 11/14/2012 CT abdomen and pelvis at which time they appeared consistent with cysts. 5. Mild but acute appearing L2 compression deformity.   Electronically Signed   By: Esperanza Heir M.D.   On: 05/22/2014 20:52     EKG Interpretation   Date/Time:  Tuesday May 22 2014 17:55:16 EDT Ventricular Rate:  81 PR Interval:  163 QRS Duration: 101 QT Interval:  366 QTC Calculation: 425 R Axis:   83 Text Interpretation:  Sinus rhythm Atrial premature complex Non-specific  ST-t changes Confirmed by Denton Lank  MD, Caryn Bee ([id number]) on 05/22/2014 8:01:49  PM      MDM   Final diagnoses:  Hemoptysis  Anemia, unspecified anemia type  AAA (abdominal aortic aneurysm) without rupture  Chronic atrial fibrillation  CAP (community acquired pneumonia)  CKD (chronic kidney disease) stage 3, GFR 30-59 ml/min  Hypothyroidism, unspecified hypothyroidism type    Pt with end stage COPD/ PF, afib on coumadin presents for hemoptysis, small amounts a few times per day. Tachypneic at baseline 2.5L [place] O2. Doubt COPD  exacerbation as patient has now wheezes and normal exp phase. Desaturations noted with exertion.  Hgb down 2 points from most recent measurement. Therapeutic on coumadin doubt PE. CTA study ordered to rule out PE as well as to identify infiltrative lesion, pulm hemorrhage if any.  Imaging c.w. PNA and severe COPD, no PE.  Treat as CAP with rocpehin and azithro.   Filed Vitals:   05/22/14 2053 05/22/14 2100 05/22/14 2130 05/22/14 2300  BP:  124/76 157/63 132/55  Pulse: 94 78 78 87  Temp:      Resp: SpO2: 91% 98% 97% 99%   Hospitalist to admit.     Sofie Rower, MD 05/23/14 [phone]

## 2014-05-23 ENCOUNTER — Ambulatory Visit: Payer: Medicare Other | Admitting: Pulmonary Disease

## 2014-05-23 ENCOUNTER — Telehealth: Payer: Self-pay | Admitting: Pulmonary Disease

## 2014-05-23 DIAGNOSIS — J189 Pneumonia, unspecified organism: Secondary | ICD-10-CM

## 2014-05-23 DIAGNOSIS — R042 Hemoptysis: Secondary | ICD-10-CM

## 2014-05-23 DIAGNOSIS — E46 Unspecified protein-calorie malnutrition: Secondary | ICD-10-CM | POA: Diagnosis present

## 2014-05-23 DIAGNOSIS — I4891 Unspecified atrial fibrillation: Secondary | ICD-10-CM

## 2014-05-23 DIAGNOSIS — J441 Chronic obstructive pulmonary disease with (acute) exacerbation: Secondary | ICD-10-CM | POA: Diagnosis not present

## 2014-05-23 LAB — CBC
HCT: 28.8 % — ABNORMAL LOW (ref 39.0–52.0)
HCT: 28.9 % — ABNORMAL LOW (ref 39.0–52.0)
Hemoglobin: 9.4 g/dL — ABNORMAL LOW (ref 13.0–17.0)
Hemoglobin: 9.2 g/dL — ABNORMAL LOW (ref 13.0–17.0)
MCH: 32.4 pg (ref 26.0–34.0)
MCH: 31.6 pg (ref 26.0–34.0)
MCHC: 32.6 g/dL (ref 30.0–36.0)
MCHC: 31.8 g/dL (ref 30.0–36.0)
MCV: 99.3 fL (ref 78.0–100.0)
MCV: 99.3 fL (ref 78.0–100.0)
Platelets: 210 10*3/uL (ref 150–400)
Platelets: 223 10*3/uL (ref 150–400)
RBC: 2.9 MIL/uL — ABNORMAL LOW (ref 4.22–5.81)
RBC: 2.91 MIL/uL — ABNORMAL LOW (ref 4.22–5.81)
RDW: 17.1 % — ABNORMAL HIGH (ref 11.5–15.5)
RDW: 17 % — ABNORMAL HIGH (ref 11.5–15.5)
WBC: 4.8 10*3/uL (ref 4.0–10.5)
WBC: 5.4 10*3/uL (ref 4.0–10.5)

## 2014-05-23 LAB — IRON AND TIBC
Iron: 36 ug/dL — ABNORMAL LOW (ref 42–135)
Saturation Ratios: 18 % — ABNORMAL LOW (ref 20–55)
TIBC: 204 ug/dL — ABNORMAL LOW (ref 215–435)
UIBC: 168 ug/dL (ref 125–400)

## 2014-05-23 LAB — FOLATE: Folate: 10.4 ng/mL

## 2014-05-23 LAB — VITAMIN B12: VITAMIN B 12: 293 pg/mL (ref 211–911)

## 2014-05-23 LAB — RETICULOCYTES
RBC.: 2.93 MIL/uL — ABNORMAL LOW (ref 4.22–5.81)
Retic Count, Absolute: 90.8 10*3/uL (ref 19.0–186.0)
Retic Ct Pct: 3.1 % (ref 0.4–3.1)

## 2014-05-23 LAB — COMPREHENSIVE METABOLIC PANEL
ALT: 8 U/L (ref 0–53)
AST: 17 U/L (ref 0–37)
Albumin: 3.4 g/dL — ABNORMAL LOW (ref 3.5–5.2)
Alkaline Phosphatase: 88 U/L (ref 39–117)
Anion gap: 9 (ref 5–15)
BUN: 14 mg/dL (ref 6–23)
CO2: 31 mEq/L (ref 19–32)
Calcium: 8.7 mg/dL (ref 8.4–10.5)
Chloride: 97 mEq/L (ref 96–112)
Creatinine, Ser: 0.82 mg/dL (ref 0.50–1.35)
GFR calc Af Amer: >90 mL/min (ref >90)
GFR calc non Af Amer: 79 mL/min — ABNORMAL LOW (ref >90)
Glucose, Bld: 138 mg/dL — ABNORMAL HIGH (ref 70–99)
Potassium: 3.6 mEq/L — ABNORMAL LOW (ref 3.7–5.3)
SODIUM: 137 meq/L (ref 137–147)
TOTAL PROTEIN: 7.4 g/dL (ref 6.0–8.3)
Total Bilirubin: 0.8 mg/dL (ref 0.3–1.2)

## 2014-05-23 LAB — TROPONIN I
Troponin I: <0.3 ng/mL (ref <0.30)
Troponin I: <0.3 ng/mL
Troponin I: <0.3 ng/mL

## 2014-05-23 LAB — STREP PNEUMONIAE URINARY ANTIGEN: Strep Pneumo Urinary Antigen: NEGATIVE

## 2014-05-23 LAB — LEGIONELLA ANTIGEN, URINE: Legionella Antigen, Urine: NEGATIVE

## 2014-05-23 LAB — MRSA PCR SCREENING: MRSA by PCR: NEGATIVE

## 2014-05-23 LAB — PROTIME-INR
INR: 2.36 — ABNORMAL HIGH (ref 0.00–1.49)
Prothrombin Time: 25.8 seconds — ABNORMAL HIGH (ref 11.6–15.2)

## 2014-05-23 LAB — FERRITIN: Ferritin: 439 ng/mL — ABNORMAL HIGH (ref 22–322)

## 2014-05-23 MED ORDER — ENSURE COMPLETE PO LIQD
237.0000 mL | Freq: Two times a day (BID) | ORAL | Status: DC
  Administered 2014-05-23 – 2014-06-04 (×18): 237 mL via ORAL

## 2014-05-23 MED ORDER — IPRATROPIUM-ALBUTEROL 0.5-2.5 (3) MG/3ML IN SOLN
3.0000 mL | RESPIRATORY_TRACT | Status: DC
  Administered 2014-05-23 – 2014-05-28 (×26): 3 mL via RESPIRATORY_TRACT
  Filled 2014-05-23 (×25): qty 3

## 2014-05-23 MED ORDER — ALBUTEROL SULFATE (2.5 MG/3ML) 0.083% IN NEBU
2.5000 mg | INHALATION_SOLUTION | RESPIRATORY_TRACT | Status: DC
  Administered 2014-05-23 (×2): 2.5 mg via RESPIRATORY_TRACT
  Filled 2014-05-23 (×2): qty 3

## 2014-05-23 MED ORDER — WARFARIN SODIUM 3 MG PO TABS
3.0000 mg | ORAL_TABLET | ORAL | Status: DC
  Administered 2014-05-23 – 2014-05-27 (×5): 3 mg via ORAL
  Filled 2014-05-23 (×6): qty 1

## 2014-05-23 MED ORDER — WARFARIN SODIUM 4 MG PO TABS: 4.5000 mg | ORAL_TABLET | ORAL | Status: DC

## 2014-05-23 MED ORDER — ALBUTEROL SULFATE (2.5 MG/3ML) 0.083% IN NEBU
2.5000 mg | INHALATION_SOLUTION | RESPIRATORY_TRACT | Status: DC | PRN
  Filled 2014-05-23 (×2): qty 3

## 2014-05-23 MED ORDER — WARFARIN - PHARMACIST DOSING INPATIENT: Freq: Every day | Status: DC

## 2014-05-23 MED ORDER — POTASSIUM CHLORIDE CRYS ER 20 MEQ PO TBCR
40.0000 meq | EXTENDED_RELEASE_TABLET | Freq: Once | ORAL | Status: AC
  Administered 2014-05-23: 40 meq via ORAL
  Filled 2014-05-23: qty 2

## 2014-05-23 NOTE — Evaluation (Signed)
Physical Therapy Evaluation Patient Details Name: Travis Bond MRN: [id number] DOB: 18-Mar-1927 Today's Date: 05/23/2014   History of Present Illness  Travis Bond is a 80 y.o. male with PMH of CKD-III, COPD, A. fib on coumadin, coronary artery disease (post status of stent placement 5 years ago), GERD, aortic stenosis, who presents with shortness of breath and hemoptysis.Patient reports that he was treated for pneumonia as outpatient 4 weeks ago. CTA showed no evidence of pulmonary embolism, but has reticular nodular infiltrate which suggests pneumonia per radiologist. Hgb 9.3 (from 11.1 in 6/15)  Clinical Impression  Pt admitted with dyspnea from CAP. Pt currently with functional limitations due to the deficits listed below (see PT Problem List). Pt ambulated only 10' with RW and min A, could tolerate no more, O2 sats 86% on 3L O2.  Pt will benefit from skilled PT to increase their independence and safety with mobility to allow discharge to the venue listed below. Do not feel that at this point pt would be safe to go home to 2 story house with intermittent assistance. PT will continue to follow.       Follow Up Recommendations SNF;Supervision/Assistance - 24 hour    Equipment Recommendations  Other (comment) (rollator if home)    Recommendations for Other Services OT consult     Precautions / Restrictions Precautions Precautions: Fall Restrictions Weight Bearing Restrictions: No      Mobility  Bed Mobility Overal bed mobility: Modified Independent                Transfers Overall transfer level: Needs assistance Equipment used: Rolling walker (2 wheeled) Transfers: Sit to/from Stand Sit to Stand: Supervision         General transfer comment: vc's for hand placement, supervision for balance as pt has been getting dizzy today  Ambulation/Gait Ambulation/Gait assistance: Min assist Ambulation Distance (Feet): 10 Feet Assistive device: Rolling walker (2  wheeled) Gait Pattern/deviations: Shuffle;Trunk flexed;Decreased step length - right;Decreased step length - left Gait velocity: very slow   General Gait Details: pt fatigued after only 5', could not ambulate further than 10'. O2 increased to 3L but pt still desaturated to 86% and very fatigued afterwards. Used RW for [employer] Rankin (Stroke Patients Only)       Balance Overall balance assessment: Needs assistance Sitting-balance support: No upper extremity supported;Feet supported Sitting balance-Leahy Scale: Fair     Standing balance support: Bilateral upper extremity supported;During functional activity Standing balance-Leahy Scale: Poor Standing balance comment: needs UE support due to fatigue                             Pertinent Vitals/Pain Pain Assessment: No/denies pain See gait    Home Living Family/patient expects to be discharged to:: Private residence Living Arrangements: Children Available Help at Discharge: Available PRN/intermittently;Family (HHaide recently) Type of Home: House       Home Layout: Two level Home Equipment: None Additional Comments: daughter and son in law work. Pt's bed/ bath is upstairs and family has talked about putting in a chair lift but unsure about time frame of when this can happen. Also report need to install a walk in shower instead of tub. Pt has had increasing dyspnea past month at least    Prior Function Level of Independence: Needs assistance   Gait / Transfers Assistance Needed: walked  independently without AD  ADL's / Homemaking Assistance Needed: HHaide has been assisting with bathing and home mgmt  Comments: pt has O2 at home     Hand Dominance        Extremity/Trunk Assessment   Upper Extremity Assessment: Defer to OT evaluation;Generalized weakness           Lower Extremity Assessment: Generalized weakness      Cervical  / Trunk Assessment: Kyphotic  Communication   Communication: No difficulties  Cognition Arousal/Alertness: Awake/alert Behavior During Therapy: WFL for tasks assessed/performed;Flat affect Overall Cognitive Status: Within Functional Limits for tasks assessed                      General Comments      Exercises        Assessment/Plan    PT Assessment Patient needs continued PT services  PT Diagnosis Difficulty walking;Generalized weakness   PT Problem List Decreased strength;Decreased activity tolerance;Decreased balance;Decreased mobility;Decreased knowledge of use of DME;Decreased knowledge of precautions;Cardiopulmonary status limiting activity  PT Treatment Interventions DME instruction;Gait training;Functional mobility training;Therapeutic activities;Therapeutic exercise;Stair training;Balance training;Patient/family education   PT Goals (Current goals can be found in the Care Plan section) Acute Rehab PT Goals Patient Stated Goal: none stated PT Goal Formulation: With patient Time For Goal Achievement: 06/06/14 Potential to Achieve Goals: Fair    Frequency Min 2X/week   Barriers to discharge Decreased caregiver support;Inaccessible home environment pt alone most of day, upstairs bedroom    Co-evaluation               End of Session Equipment Utilized During Treatment: Gait belt;Oxygen Activity Tolerance: Patient limited by fatigue Patient left: in bed;with call bell/phone within reach;with family/visitor present Nurse Communication: Mobility status;Other (comment) (O2)         Time: 1610-9604 PT Time Calculation (min): 37 min   Charges:   PT Evaluation $Initial PT Evaluation Tier I: 1 Procedure PT Treatments $Gait Training: 8-22 mins $Therapeutic Activity: 23-37 mins   PT G Codes:        Lyanne Co, PT  Acute Rehab Services  [phone]   Pecola Leisure, [place] 05/23/2014, 1:53 PM

## 2014-05-23 NOTE — Progress Notes (Signed)
Subjective:  Patient was admitted with pneumonia and hemoptysis. He has a complex history and has been going downhill recently. The daughter called the office concerned about whether he should remain on Coumadin or not and I reviewed the chart. Currently the patient is not having shortness of breath or hemoptysis but is very weak.  Objective:  Vital Signs in the last 24 hours: BP 120/59  Pulse 75  Temp(Src) 98.2 F (36.8 C) (Oral)  Resp 16  Ht 5' 10.5" (1.791 m)  Wt 64 kg (141 lb 1.5 oz)  BMI 19.95 kg/m2  SpO2 96%  Physical Exam: Thin elderly male in no acute distress Lungs:  Increased AP diameter, mild rales Cardiac: Regular, no murmur rhythm, normal S1 and S2, no S3 Extremities:  No edema present  Intake/Output from previous day: 09/22 0701 - 09/23 0700 In: -  Out: 525 [Urine:525]  Weight Filed Weights   05/22/14 2337 05/23/14 0534  Weight: 62.778 kg (138 lb 6.4 oz) 64 kg (141 lb 1.5 oz)    Lab Results: Basic Metabolic Panel:  Recent Labs  16/10/96 1825 05/23/14 0045  NA 137 137  K 3.6* 3.6*  CL 96 97  CO2 32 31  GLUCOSE 99 138*  BUN 17 14  CREATININE 0.93 0.82   CBC:  Recent Labs  05/23/14 0045 05/23/14 1131  WBC 4.8 5.4  HGB 9.4* 9.2*  HCT 28.8* 28.9*  MCV 99.3 99.3  PLT 210 223   Cardiac Enzymes:  Recent Labs  05/23/14 0045 05/23/14 0419 05/23/14 1131  TROPONINI <0.30 <0.30 <0.30    Telemetry: Currently sinus rhythm  Assessment/Plan:  1. Prior history of coronary artery disease with drug-eluting stent placement currently on Plavix-clinically stable with no angina 2. History of paroxysmal atrial fibrillation with previous cardioversion maintaining sinus rhythm  Recommendations:  I think his Coumadin can be held for a few days he continues to have hemoptysis. Once hemoptysis resolved we can restart it and try to run it at a low dose. Hemoptysis not really been a problem in the past.      W. Ashley Royalty  MD  [facility] Cardiology  05/23/2014, 2:05 PM

## 2014-05-23 NOTE — Progress Notes (Signed)
[facility] Progress Note   Travis Bond WJX:[id number] DOB: [birth year] DOA: 05/22/2014 PCP: No primary provider on file.  Brief narrative: Travis Bond is a 80 y.o. male admitted with pneumonia with symptoms of hemoptysis. He was recently treated for CAP 4 wks ago with 7 days. He developed recurrent symptoms about 2 wks ago and now is coughing up blood as well. 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    Subjective: Continues to have cough and dyspnea. Mild chest pain when coughing.   Assessment/Plan: Principal Problem:   Community acquired pneumonia - second episode - urine strep antigen negative - Rocephin and Zithromax  Active Problems:   C O P D - cont O2- chronically on 2.5 L at home - Nebs routinely Q 4 hrs and PRN    Atrial fibrillation - cont Dig, Coreg and Coumadin     Drug eluting stent - CAD - Cont Plavix  AOCD - Hb stable  Code Status: Full code Family Communication: none Disposition Plan: to be determined DVT prophylaxis: Coumadin  Consultants: none  Procedures: none  Antibiotics: Anti-infectives   Start     Dose/Rate Route Frequency Ordered Stop   05/23/14 2200  azithromycin (ZITHROMAX) tablet 500 mg     500 mg Oral Every 24 hours 05/22/14 2341 05/30/14 2159   05/23/14 2200  cefTRIAXone (ROCEPHIN) 1 g in dextrose 5 % 50 mL IVPB     1 g 100 mL/hr over 30 Minutes Intravenous Every 24 hours 05/22/14 2341 05/30/14 2159   05/23/14 0000  cefTRIAXone (ROCEPHIN) 1 g in dextrose 5 % 50 mL IVPB  Status:  Discontinued     1 g 100 mL/hr over 30 Minutes Intravenous Every 24 hours 05/22/14 2340 05/22/14 2341   05/22/14 2315  cefTRIAXone (ROCEPHIN) 1 g in dextrose 5 % 50 mL IVPB  Status:  Discontinued     1 g 100 mL/hr over 30 Minutes Intravenous Every 24 hours 05/22/14 2300 05/22/14 2339   05/22/14 2315  azithromycin (ZITHROMAX) tablet 500 mg  Status:  Discontinued     500 mg Oral Every 24  hours 05/22/14 2300 05/22/14 2340   05/22/14 2200  cefTRIAXone (ROCEPHIN) 1 g in dextrose 5 % 50 mL IVPB  Status:  Discontinued     1 g 100 mL/hr over 30 Minutes Intravenous Every 24 hours 05/22/14 2146 05/22/14 2300   05/22/14 2200  azithromycin (ZITHROMAX) 500 mg in dextrose 5 % 250 mL IVPB     500 mg 250 mL/hr over 60 Minutes Intravenous  Once 05/22/14 2146 05/22/14 2350         Objective: Filed Weights   05/22/14 2337 05/23/14 0534  Weight: 62.778 kg (138 lb 6.4 oz) 64 kg (141 lb 1.5 oz)    Intake/Output Summary (Last 24 hours) at 05/23/14 1145 Last data filed at 05/23/14 1029  Gross per 24 hour  Intake      0 ml  Output   1265 ml  Net  -1265 ml     Vitals Filed Vitals:   05/22/14 2337 05/23/14 0100 05/23/14 0534 05/23/14 0855  BP: 144/100 150/74 157/83   Pulse: 99  100   Temp: 97.5 F (36.4 C)  97.5 F (36.4 C)   TempSrc: Oral  Oral   Resp: 22  24   Height: 5' 10.5" (1.791 m)     Weight: 62.778 kg (138 lb 6.4 oz)  64 kg (141 lb 1.5 oz)   SpO2:  92%  97% 98%    Exam: General: No acute respiratory distress Lungs: wheeze and ronchi bilaterally  Cardiovascular: Regular rate and rhythm without murmur gallop or rub normal S1 and S2 Abdomen: Nontender, nondistended, soft, bowel sounds positive, no rebound, no ascites, no appreciable mass Extremities: No significant cyanosis, clubbing, or edema bilateral lower extremities  Data Reviewed: Basic Metabolic Panel:  Recent Labs Lab 05/22/14 1825 05/23/14 0045  NA 137 137  K 3.6* 3.6*  CL 96 97  CO2 32 31  GLUCOSE 99 138*  BUN 17 14  CREATININE 0.93 0.82  CALCIUM 9.1 8.7   Liver Function Tests:  Recent Labs Lab 05/22/14 1825 05/23/14 0045  AST 17 17  ALT 8 8  ALKPHOS 95 88  BILITOT 0.9 0.8  PROT 8.0 7.4  ALBUMIN 3.7 3.4*   No results found for this basename: LIPASE, AMYLASE,  in the last 168 hours No results found for this basename: AMMONIA,  in the last 168 hours CBC:  Recent Labs Lab  05/22/14 1825 05/23/14 0045  WBC 5.2 4.8  HGB 9.3* 9.4*  HCT 29.0* 28.8*  MCV 99.0 99.3  PLT 205 210   Cardiac Enzymes:  Recent Labs Lab 05/23/14 0045 05/23/14 0419  TROPONINI <0.30 <0.30   BNP (last 3 results) No results found for this basename: PROBNP,  in the last 8760 hours CBG: No results found for this basename: GLUCAP,  in the last 168 hours  Recent Results (from the past 240 hour(s))  MRSA PCR SCREENING     Status: None   Collection Time    05/22/14 11:59 PM      Result Value Ref Range Status   MRSA by PCR NEGATIVE  NEGATIVE Final   Comment:            The GeneXpert MRSA Assay (FDA     approved for NASAL specimens     only), is one component of a     comprehensive MRSA colonization     surveillance program. It is not     intended to diagnose MRSA     infection nor to guide or     monitor treatment for     MRSA infections.     Studies:  Recent x-ray studies have been reviewed in detail by the Attending Physician  Scheduled Meds:  Scheduled Meds: . albuterol  2.5 mg Nebulization Q4H  . azithromycin  500 mg Oral Q24H  . calcium-vitamin D  1 tablet Oral Q breakfast  . carvedilol  3.125 mg Oral BID WC  . cefTRIAXone (ROCEPHIN)  IV  1 g Intravenous Q24H  . clopidogrel  75 mg Oral Daily  . digoxin  0.125 mg Oral Daily  . feeding supplement (ENSURE COMPLETE)  237 mL Oral BID BM  . fentaNYL  25 mcg Transdermal Q72H  . gabapentin  600 mg Oral BID  . guaiFENesin  600 mg Oral BID  . Influenza vac split quadrivalent PF  0.5 mL Intramuscular Tomorrow-1000  . ipratropium  0.5 mg Nebulization QID  . isosorbide mononitrate  60 mg Oral Daily  . levothyroxine  88 mcg Oral QAC breakfast  . lubiprostone  24 mcg Oral BID WC  . mometasone-formoterol  2 puff Inhalation BID  . niacin  500 mg Oral QHS  . saccharomyces boulardii  250 mg Oral BID  . simvastatin  10 mg Oral q1800  . warfarin  3 mg Oral Once per day on Sun Mon Wed Thu Fri Sat   And  . [  START ON  05/29/2014] warfarin  4.5 mg Oral Once per day on Tue  . Warfarin - Pharmacist Dosing Inpatient   Does not apply q1800   Continuous Infusions: . sodium chloride 75 mL/hr at 05/22/14 2345    Time spent on care of this patient: 35 min   Eamonn Sermeno, MD 05/23/2014, 11:45 AM  LOS: 1 day   [facility] Office  [phone] Pager - Text Page per [email or web address]  If 7PM-7AM, please contact night-coverage [email or web address]

## 2014-05-23 NOTE — Telephone Encounter (Signed)
Noted  

## 2014-05-23 NOTE — Progress Notes (Addendum)
ANTICOAGULATION CONSULT NOTE - Follow up  Pharmacy Consult for Coumadin Indication: atrial fibrillation  No Known Allergies  Patient Measurements: Height: 5' 10.5" (179.1 cm) Weight: 141 lb 1.5 oz (64 kg) (bedscale due to SOB) IBW/kg (Calculated) : 74.15  Vital Signs: Temp: 97.5 F (36.4 C) (09/23 0534) Temp src: Oral (09/23 0534) BP: 157/83 mmHg (09/23 0534) Pulse Rate: 100 (09/23 0534)  Labs:  Recent Labs  05/22/14 1825 05/23/14 0045 05/23/14 0419 05/23/14 0920  HGB 9.3* 9.4*  --   --   HCT 29.0* 28.8*  --   --   PLT 205 210  --   --   LABPROT 28.2*  --   --  25.8*  INR 2.64*  --   --  2.36*  CREATININE 0.93 0.82  --   --   TROPONINI  --  <0.30 <0.30  --     Estimated Creatinine Clearance: 60.7 ml/min (by C-G formula based on Cr of 0.82).   Medical History: Past Medical History  Diagnosis Date  . COPD (chronic obstructive pulmonary disease)   . Chronic respiratory failure with hypoxia   . Pulmonary fibrosis   . Coronary artery disease   . Hypertension   . Hyperlipidemia   . Carotid stenosis   . Peripheral vascular disease   . Anemia   . Peripheral neuropathy   . B12 deficiency   . Pneumonia   . Atrial fibrillation   . Hypothyroidism   . Anxiety state, unspecified   . Unspecified deficiency anemia   . Abdominal aneurysm without mention of rupture   . Aortic valve disorders   . Type II or unspecified type diabetes mellitus without mention of complication, not stated as uncontrolled   . Unspecified hereditary and idiopathic peripheral neuropathy   . Microscopic hematuria   . Chronic kidney disease     Kidney stones    Medications:  Prescriptions prior to admission  Medication Sig Dispense Refill  . albuterol (PROVENTIL HFA;VENTOLIN HFA) 108 (90 BASE) MCG/ACT inhaler Inhale 2 puffs into the lungs every 6 (six) hours as needed for wheezing or shortness of breath.      Marland Kitchen albuterol (PROVENTIL) (2.5 MG/3ML) 0.083% nebulizer solution Take 2.5 mg by  nebulization every 6 (six) hours as needed for wheezing or shortness of breath.      . ALPRAZolam (XANAX) 0.25 MG tablet Take 0.25 mg by mouth at bedtime as needed for anxiety.      . calcium-vitamin D (OSCAL WITH D) 500-200 MG-UNIT per tablet Take 1 tablet by mouth daily with breakfast.      . carvedilol (COREG) 3.125 MG tablet Take 3.125 mg by mouth 2 (two) times daily with a meal.       . clopidogrel (PLAVIX) 75 MG tablet Take 75 mg by mouth daily.        . digoxin (LANOXIN) 0.125 MG tablet Take 0.125 mg by mouth daily.      . fentaNYL (DURAGESIC - DOSED MCG/HR) 25 MCG/HR patch Place 25 mcg onto the skin every 3 (three) days.      . Fluticasone-Salmeterol (ADVAIR) 250-50 MCG/DOSE AEPB Inhale 1 puff into the lungs 2 (two) times daily.      . furosemide (LASIX) 20 MG tablet Take 20 mg by mouth 2 (two) times daily.      Marland Kitchen gabapentin (NEURONTIN) 300 MG capsule Take 600 mg by mouth 2 (two) times daily.      Marland Kitchen guaiFENesin (MUCINEX) 600 MG 12 hr tablet Take 600 mg by  mouth 2 (two) times daily.      Marland Kitchen ipratropium (ATROVENT) 0.02 % nebulizer solution Take 0.5 mg by nebulization 4 (four) times daily.      . isosorbide mononitrate (IMDUR) 60 MG 24 hr tablet Take 60 mg by mouth daily.        Marland Kitchen levothyroxine (SYNTHROID, LEVOTHROID) 88 MCG tablet Take 88 mcg by mouth daily before breakfast.      . lubiprostone (AMITIZA) 24 MCG capsule Take 24 mcg by mouth 2 (two) times daily with a meal.      . niacin (NIASPAN) 500 MG CR tablet Take 500 mg by mouth at bedtime.        . nitroGLYCERIN (NITROSTAT) 0.4 MG SL tablet Place 0.4 mg under the tongue every 5 (five) minutes as needed for chest pain.      . pravastatin (PRAVACHOL) 20 MG tablet Take 20 mg by mouth daily.       Marland Kitchen saccharomyces boulardii (FLORASTOR) 250 MG capsule Take 250 mg by mouth 2 (two) times daily.      Marland Kitchen warfarin (COUMADIN) 3 MG tablet Take 3-4.5 mg by mouth daily. Take 4.5 on Tuesday ONLY, all other days take  including Sunday      .  moxifloxacin (AVELOX) 400 MG tablet Take 400 mg by mouth daily at 8 pm. x7 days        Assessment: Today's INR is 2.36 in this 80 yo male admitted last night with SOB, h/o Afib, to continue Coumadin. Home regimen: Coumadin 3 mg daily except 4.5 mg every Tues. No bleeding noted. H/o anemia. CBC stable with Hgb 9.4, Hct 28.8 and PLTC 210K.   Goal of Therapy:  INR 2-3 Monitor platelets by anticoagulation protocol: Yes   Plan:  Continue home regimen: Coumadin 3 mg daily except 4.5 mg every Tues.  Daily INR  Noah Delaine, RPh Clinical Pharmacist Pager: [phone] 05/23/2014,11:38 AM  Addendum:  Per H&P last PM the patient reports that 2weeks ago he started coughing up small amount of blood. Sometimes the blood is black and other time is bright red. RN reports still coughing up small amt of blood due to PNA. No other active bleeding noted per RN's report.  INR is therapeutic. MD continuing coumadin for h/o afib.  Will continue with home coumadin regimen as noted above. Also on azithromycin - watch closely as may increase warfarin effect.  Noah Delaine, RPh Clinical Pharmacist Pager: [phone] 05/23/2014 11:54 AM

## 2014-05-23 NOTE — Telephone Encounter (Signed)
Called spoke with Renee. Pt is currently in [facility] w/ PNA. FYI for Dr. Craige Cotta,

## 2014-05-23 NOTE — Progress Notes (Signed)
ANTICOAGULATION CONSULT NOTE - Initial Consult  Pharmacy Consult for Coumadin Indication: atrial fibrillation  No Known Allergies  Patient Measurements: Height: 5' 10.5" (179.1 cm) Weight: 138 lb 6.4 oz (62.778 kg) IBW/kg (Calculated) : 74.15  Vital Signs: Temp: 97.5 F (36.4 C) (09/22 2337) Temp src: Oral (09/22 2337) BP: 144/100 mmHg (09/22 2337) Pulse Rate: 99 (09/22 2337)  Labs:  Recent Labs  05/22/14 1825  HGB 9.3*  HCT 29.0*  PLT 205  LABPROT 28.2*  INR 2.64*  CREATININE 0.93    Estimated Creatinine Clearance: 52.5 ml/min (by C-G formula based on Cr of 0.93).   Medical History: Past Medical History  Diagnosis Date  . COPD (chronic obstructive pulmonary disease)   . Chronic respiratory failure with hypoxia   . Pulmonary fibrosis   . Coronary artery disease   . Hypertension   . Hyperlipidemia   . Carotid stenosis   . Peripheral vascular disease   . Anemia   . Peripheral neuropathy   . B12 deficiency   . Pneumonia   . Atrial fibrillation   . Hypothyroidism   . Anxiety state, unspecified   . Unspecified deficiency anemia   . Abdominal aneurysm without mention of rupture   . Aortic valve disorders   . Type II or unspecified type diabetes mellitus without mention of complication, not stated as uncontrolled   . Unspecified hereditary and idiopathic peripheral neuropathy   . Microscopic hematuria   . Chronic kidney disease     Kidney stones    Medications:  Prescriptions prior to admission  Medication Sig Dispense Refill  . albuterol (PROVENTIL HFA;VENTOLIN HFA) 108 (90 BASE) MCG/ACT inhaler Inhale 2 puffs into the lungs every 6 (six) hours as needed for wheezing or shortness of breath.      Marland Kitchen albuterol (PROVENTIL) (2.5 MG/3ML) 0.083% nebulizer solution Take 2.5 mg by nebulization every 6 (six) hours as needed for wheezing or shortness of breath.      . ALPRAZolam (XANAX) 0.25 MG tablet Take 0.25 mg by mouth at bedtime as needed for anxiety.      .  calcium-vitamin D (OSCAL WITH D) 500-200 MG-UNIT per tablet Take 1 tablet by mouth daily with breakfast.      . carvedilol (COREG) 3.125 MG tablet Take 3.125 mg by mouth 2 (two) times daily with a meal.       . clopidogrel (PLAVIX) 75 MG tablet Take 75 mg by mouth daily.        . digoxin (LANOXIN) 0.125 MG tablet Take 0.125 mg by mouth daily.      . fentaNYL (DURAGESIC - DOSED MCG/HR) 25 MCG/HR patch Place 25 mcg onto the skin every 3 (three) days.      . Fluticasone-Salmeterol (ADVAIR) 250-50 MCG/DOSE AEPB Inhale 1 puff into the lungs 2 (two) times daily.      . furosemide (LASIX) 20 MG tablet Take 20 mg by mouth 2 (two) times daily.      Marland Kitchen gabapentin (NEURONTIN) 300 MG capsule Take 600 mg by mouth 2 (two) times daily.      Marland Kitchen guaiFENesin (MUCINEX) 600 MG 12 hr tablet Take 600 mg by mouth 2 (two) times daily.      Marland Kitchen ipratropium (ATROVENT) 0.02 % nebulizer solution Take 0.5 mg by nebulization 4 (four) times daily.      . isosorbide mononitrate (IMDUR) 60 MG 24 hr tablet Take 60 mg by mouth daily.        Marland Kitchen levothyroxine (SYNTHROID, LEVOTHROID) 88 MCG tablet Take 88  mcg by mouth daily before breakfast.      . lubiprostone (AMITIZA) 24 MCG capsule Take 24 mcg by mouth 2 (two) times daily with a meal.      . niacin (NIASPAN) 500 MG CR tablet Take 500 mg by mouth at bedtime.        . nitroGLYCERIN (NITROSTAT) 0.4 MG SL tablet Place 0.4 mg under the tongue every 5 (five) minutes as needed for chest pain.      . pravastatin (PRAVACHOL) 20 MG tablet Take 20 mg by mouth daily.       Marland Kitchen saccharomyces boulardii (FLORASTOR) 250 MG capsule Take 250 mg by mouth 2 (two) times daily.      Marland Kitchen warfarin (COUMADIN) 3 MG tablet Take 3-4.5 mg by mouth daily. Take 4.5 on Tuesday ONLY, all other days take  including Sunday      . moxifloxacin (AVELOX) 400 MG tablet Take 400 mg by mouth daily at 8 pm. x7 days        Assessment: 80 yo male admitted with SOB, h/o Afib, to continue Coumadin  Goal of Therapy:  INR  2-3 Monitor platelets by anticoagulation protocol: Yes   Plan:  Continue home regimen Daily INR  Travis Bond, Gary Fleet 05/23/2014,12:55 AM

## 2014-05-23 NOTE — Progress Notes (Signed)
Patient arrived to floor from ED via stretcher.  Patient alert, oriented and ambulatory with assistance. Admission weight, vitals and assessment completed. Fall and safety plan reviewed with patient and family.  Patient resting, call light within reach and bed alarm in use. Blood pressure 132/55, pulse 87, temperature 98 F (36.7 C), resp. rate 22, SpO2 99.00%. Travis Bond

## 2014-05-23 NOTE — Care Management Note (Addendum)
    Page 1 of 2   06/04/2014     2:38:08 PM CARE MANAGEMENT NOTE 06/04/2014  Patient:  [facility]   Account Number:  [id number]  Date Initiated:  05/23/2014  Documentation initiated by:  [facility]  Subjective/Objective Assessment:   80 y.o. male with PMH of CKD-III, COPD,  A. fib on coumadin, coronary artery disease (post status of stent placement 5 years ago), GERD, aortic stenosis, who presents with SOB and hemoptysis.//Home with  daughter.     Action/Plan:   - will admitted to the telemetry bed  - IV Rocephin plus azithromycin orally  - Will send Sputum for Gram stain and culture  - Obtain Blood Cultures x2 //Access for disposition needs.   Anticipated DC Date:  05/27/2014   Anticipated DC Plan:  SKILLED NURSING FACILITY  In-house referral  Clinical Social Worker      DC Planning Services  CM consult      Choice offered to / List presented to:             Status of service:  Completed, signed off Medicare Important Message given?  YES (If response is "NO", the following Medicare IM given date fields will be blank) Date Medicare IM given:  05/25/2014 Medicare IM given by:  [facility] Date Additional Medicare IM given:  05/28/2014 Additional Medicare IM given by:  Erminio Nygard  Discharge Disposition:  SKILLED NURSING FACILITY  Per UR Regulation:  Reviewed for med. necessity/level of care/duration of stay  If discussed at Long Length of Stay Meetings, dates discussed:   06/04/2014  05/31/2014  05/29/2014    Comments:  06/01/14 1100 Nikhil Osei Lucretia Roers, RN, BSN, [employer] [phone] Additional IM given.  PPt to d/c to [facility] today.  05/23/14 1500 Kaleo Condrey, RN, BSN, [place] [phone]  Per Physical Therapy evaluation: Pt will benefit from skilled PT to increase their independence and safety with mobility to allow discharge to the venue listed below. Do not feel that at this point pt would be safe to go home to 2 story house with intermittent assistance.

## 2014-05-24 DIAGNOSIS — F411 Generalized anxiety disorder: Secondary | ICD-10-CM

## 2014-05-24 LAB — CBC
HCT: 27.3 % — ABNORMAL LOW (ref 39.0–52.0)
HEMATOCRIT: 27.2 % — AB (ref 39.0–52.0)
HEMOGLOBIN: 8.4 g/dL — AB (ref 13.0–17.0)
Hemoglobin: 8.7 g/dL — ABNORMAL LOW (ref 13.0–17.0)
MCH: 31.3 pg (ref 26.0–34.0)
MCH: 31.6 pg (ref 26.0–34.0)
MCHC: 30.9 g/dL (ref 30.0–36.0)
MCHC: 31.9 g/dL (ref 30.0–36.0)
MCV: 101.5 fL — ABNORMAL HIGH (ref 78.0–100.0)
MCV: 99.3 fL (ref 78.0–100.0)
Platelets: 186 10*3/uL (ref 150–400)
Platelets: 197 10*3/uL (ref 150–400)
RBC: 2.68 MIL/uL — AB (ref 4.22–5.81)
RBC: 2.75 MIL/uL — ABNORMAL LOW (ref 4.22–5.81)
RDW: 17.4 % — ABNORMAL HIGH (ref 11.5–15.5)
RDW: 17.4 % — ABNORMAL HIGH (ref 11.5–15.5)
WBC: 5.2 10*3/uL (ref 4.0–10.5)
WBC: 6.2 10*3/uL (ref 4.0–10.5)

## 2014-05-24 LAB — PROTIME-INR
INR: 2.49 — AB (ref 0.00–1.49)
PROTHROMBIN TIME: 26.9 s — AB (ref 11.6–15.2)

## 2014-05-24 NOTE — Progress Notes (Signed)
ANTICOAGULATION CONSULT NOTE - Follow up  Pharmacy Consult for Coumadin Indication: atrial fibrillation  No Known Allergies  Patient Measurements: Height: 5' 10.5" (179.1 cm) Weight: 141 lb 12.1 oz (64.3 kg) IBW/kg (Calculated) : 74.15  Vital Signs: Temp: 97.5 F (36.4 C) (09/24 0900) Temp src: Oral (09/24 0900) BP: 148/99 mmHg (09/24 1100) Pulse Rate: 74 (09/24 0900)  Labs:  Recent Labs  05/22/14 1825 05/23/14 0045 05/23/14 0419 05/23/14 0920 05/23/14 1131 05/23/14 2332 05/24/14 0549 05/24/14 1045  HGB 9.3* 9.4*  --   --  9.2* 8.7*  --  8.4*  HCT 29.0* 28.8*  --   --  28.9* 27.3*  --  27.2*  PLT 205 210  --   --  223 197  --  186  LABPROT 28.2*  --   --  25.8*  --   --  26.9*  --   INR 2.64*  --   --  2.36*  --   --  2.49*  --   CREATININE 0.93 0.82  --   --   --   --   --   --   TROPONINI  --  <0.30 <0.30  --  <0.30  --   --   --     Estimated Creatinine Clearance: 61 ml/min (by C-G formula based on Cr of 0.82).   Medical History: Past Medical History  Diagnosis Date  . COPD (chronic obstructive pulmonary disease)   . Chronic respiratory failure with hypoxia   . Pulmonary fibrosis   . Coronary artery disease   . Hypertension   . Hyperlipidemia   . Carotid stenosis   . Peripheral vascular disease   . Anemia   . Peripheral neuropathy   . B12 deficiency   . Pneumonia   . Atrial fibrillation   . Hypothyroidism   . Anxiety state, unspecified   . Unspecified deficiency anemia   . Abdominal aneurysm without mention of rupture   . Aortic valve disorders   . Type II or unspecified type diabetes mellitus without mention of complication, not stated as uncontrolled   . Unspecified hereditary and idiopathic peripheral neuropathy   . Microscopic hematuria   . Chronic kidney disease     Kidney stones    Medications:  Prescriptions prior to admission  Medication Sig Dispense Refill  . albuterol (PROVENTIL HFA;VENTOLIN HFA) 108 (90 BASE) MCG/ACT inhaler  Inhale 2 puffs into the lungs every 6 (six) hours as needed for wheezing or shortness of breath.      Marland Kitchen albuterol (PROVENTIL) (2.5 MG/3ML) 0.083% nebulizer solution Take 2.5 mg by nebulization every 6 (six) hours as needed for wheezing or shortness of breath.      . ALPRAZolam (XANAX) 0.25 MG tablet Take 0.25 mg by mouth at bedtime as needed for anxiety.      . calcium-vitamin D (OSCAL WITH D) 500-200 MG-UNIT per tablet Take 1 tablet by mouth daily with breakfast.      . carvedilol (COREG) 3.125 MG tablet Take 3.125 mg by mouth 2 (two) times daily with a meal.       . clopidogrel (PLAVIX) 75 MG tablet Take 75 mg by mouth daily.        . digoxin (LANOXIN) 0.125 MG tablet Take 0.125 mg by mouth daily.      . fentaNYL (DURAGESIC - DOSED MCG/HR) 25 MCG/HR patch Place 25 mcg onto the skin every 3 (three) days.      . Fluticasone-Salmeterol (ADVAIR) 250-50 MCG/DOSE AEPB Inhale 1 puff  into the lungs 2 (two) times daily.      . furosemide (LASIX) 20 MG tablet Take 20 mg by mouth 2 (two) times daily.      Marland Kitchen gabapentin (NEURONTIN) 300 MG capsule Take 600 mg by mouth 2 (two) times daily.      Marland Kitchen guaiFENesin (MUCINEX) 600 MG 12 hr tablet Take 600 mg by mouth 2 (two) times daily.      Marland Kitchen ipratropium (ATROVENT) 0.02 % nebulizer solution Take 0.5 mg by nebulization 4 (four) times daily.      . isosorbide mononitrate (IMDUR) 60 MG 24 hr tablet Take 60 mg by mouth daily.        Marland Kitchen levothyroxine (SYNTHROID, LEVOTHROID) 88 MCG tablet Take 88 mcg by mouth daily before breakfast.      . lubiprostone (AMITIZA) 24 MCG capsule Take 24 mcg by mouth 2 (two) times daily with a meal.      . niacin (NIASPAN) 500 MG CR tablet Take 500 mg by mouth at bedtime.        . nitroGLYCERIN (NITROSTAT) 0.4 MG SL tablet Place 0.4 mg under the tongue every 5 (five) minutes as needed for chest pain.      . pravastatin (PRAVACHOL) 20 MG tablet Take 20 mg by mouth daily.       Marland Kitchen saccharomyces boulardii (FLORASTOR) 250 MG capsule Take 250 mg by  mouth 2 (two) times daily.      Marland Kitchen warfarin (COUMADIN) 3 MG tablet Take 3-4.5 mg by mouth daily. Take 4.5 on Tuesday ONLY, all other days take  including Sunday      . moxifloxacin (AVELOX) 400 MG tablet Take 400 mg by mouth daily at 8 pm. x7 days        Assessment: Today's INR is 2.49 in this 80 yo male who continues on coumadin for h/o afib.  Home regimen: Coumadin 3 mg daily except 4.5 mg every Tues. No bleeding noted. H/o anemia.  Hgb dropped to 8.4 and  PLTC is 186K (05/23/14).  CAP on day #3/5 Azithromycin-hich may increase coumadin effect. INR remains therapeutic on his usual home dosage.  No hemoptysis today. No other bleeding noted.   Goal of Therapy:  INR 2-3 Monitor platelets by anticoagulation protocol: Yes   Plan:  Continue home regimen: Coumadin 3 mg daily except 4.5 mg every Tues.  Daily INR  Noah Delaine, RPh Clinical Pharmacist Pager: [phone] 05/24/2014,11:53 AM

## 2014-05-24 NOTE — Progress Notes (Signed)
Patient alert and oriented times 4, ambulate to [facility] with assist times one, remains on oxygen at 4 liters, at times patient appeared to have labored breathing with oxygen around 87% to 90%, patient able to recover, coughing up thick, bloody sputum, scheduled breathing treatments given, continues on continuous pulse ox, able to communicate needs, will continue to monitor

## 2014-05-24 NOTE — Clinical Social Work Psychosocial (Signed)
Clinical Social Work Department BRIEF PSYCHOSOCIAL ASSESSMENT 05/24/2014  Patient:  [facility]     Account Number:  [id number]     Admit date:  05/22/2014  Clinical Social Worker:  Derenda Fennel, CLINICAL SOCIAL WORKER  Date/Time:  05/24/2014 02:00 PM  Referred by:  Physician  Date Referred:  05/24/2014 Referred for  SNF Placement   Other Referral:   PT recommended SNF Placement.   Interview type:  Patient Other interview type:   CSW interviewed patient at bedside.    PSYCHOSOCIAL DATA Living Status:  ALONE Admitted from facility:   Level of care:   Primary support name:  Renee Hindson [phone] Primary support relationship to patient:  CHILD, ADULT Degree of support available:   Strong    CURRENT CONCERNS Current Concerns  Post-Acute Placement   Other Concerns:    SOCIAL WORK ASSESSMENT / PLAN Clincial Social Worker spoke with pt at bedside in reference to pt being placed at [facility] for rehab. Pt acknowledged that he needed SNF placement but explained he was not expecting to be placed at SNF. Pt stated he lived alone and it would be hard for him to go up/down stairs alone. Pt advised CSW to contact and include his daughter in decision for SNF placement. CSW explained SNF process and provided pt with SNF list for review with his daughter. CSW also called pt's daughter to review SNF process and daughter agreed pt would benefit from rehab at [facility]. CSW to follow up with pt and pt's daughter to provide continued support and facilitate pt discharge needs.   Assessment/plan status:  Psychosocial Support/Ongoing Assessment of Needs Other assessment/ plan:   Information/referral to community resources:   CSW provided pt with SNF list.    PATIENT'S/FAMILY'S RESPONSE TO PLAN OF CARE: Pt sitting on edge of bed alert/oriented. Pt looked down frequently during interview and reported that he was a little overwhelmed in regards to placement at SNF. Pt also stated that he did not  want to be a burden on his family. CSW assured pt that SNF placement was not permanent and he would be able to return home after completing rehab requirements. Pt repeated that he would benefit from SNF and advised CSW to contact his daughter, Luster Landsberg with SNF plan. CSW contacted pt's daughter, Luster Landsberg and explained SNF process. Pt's daughter reported that she would be visiting pt this evening and would review SNF list with pt. Pt and pt's daughter both agreed with SNF plan. CSW remains available as needed/support.    Derenda Fennel, MSW, LCSWA [phone] 05/24/2014 3:50 PM

## 2014-05-24 NOTE — Progress Notes (Addendum)
[facility] Progress Note   Travis Bond NWG:[id number] DOB: [birth year] DOA: 05/22/2014 PCP: No primary provider on file.  Brief narrative: Travis Bond is a 80 y.o. male admitted with pneumonia with symptoms of hemoptysis. He was recently treated for CAP 4 wks ago with 7 days. He developed recurrent symptoms about 2 wks ago and now is coughing up blood as well. 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    Subjective: Continues to have cough and dyspnea. Mild chest pain when coughing. No hemoptysis.   Assessment/Plan: Principal Problem:   Community acquired pneumonia - second episode - urine strep antigen negative - Rocephin and Zithromax, currently on 3 lit [place] oxygen.  -Keep sats greater than 90%. Complete the course of the antibiotics.  - plan for SNF when medically stable.  - no hemoptysis today.   Active Problems:   C O P D - cont O2- chronically on 2.5 L at home - Nebs routinely Q 4 hrs and PRN    Atrial fibrillation - cont Dig, Coreg and Coumadin. INR therapeutic.      Drug eluting stent - CAD - Cont Plavix  AOCD - Hb stable  Code Status: Full code Family Communication: none at bedside.  Disposition Plan: SNF when stable.  DVT prophylaxis: Coumadin  Consultants: cardiology Procedures: none  Antibiotics: Anti-infectives   Start     Dose/Rate Route Frequency Ordered Stop   05/23/14 2200  azithromycin (ZITHROMAX) tablet 500 mg     500 mg Oral Every 24 hours 05/22/14 2341 05/30/14 2159   05/23/14 2200  cefTRIAXone (ROCEPHIN) 1 g in dextrose 5 % 50 mL IVPB     1 g 100 mL/hr over 30 Minutes Intravenous Every 24 hours 05/22/14 2341 05/30/14 2159   05/23/14 0000  cefTRIAXone (ROCEPHIN) 1 g in dextrose 5 % 50 mL IVPB  Status:  Discontinued     1 g 100 mL/hr over 30 Minutes Intravenous Every 24 hours 05/22/14 2340 05/22/14 2341   05/22/14 2315  cefTRIAXone (ROCEPHIN) 1 g in dextrose 5 % 50 mL IVPB   Status:  Discontinued     1 g 100 mL/hr over 30 Minutes Intravenous Every 24 hours 05/22/14 2300 05/22/14 2339   05/22/14 2315  azithromycin (ZITHROMAX) tablet 500 mg  Status:  Discontinued     500 mg Oral Every 24 hours 05/22/14 2300 05/22/14 2340   05/22/14 2200  cefTRIAXone (ROCEPHIN) 1 g in dextrose 5 % 50 mL IVPB  Status:  Discontinued     1 g 100 mL/hr over 30 Minutes Intravenous Every 24 hours 05/22/14 2146 05/22/14 2300   05/22/14 2200  azithromycin (ZITHROMAX) 500 mg in dextrose 5 % 250 mL IVPB     500 mg 250 mL/hr over 60 Minutes Intravenous  Once 05/22/14 2146 05/22/14 2350         Objective: Filed Weights   05/22/14 2337 05/23/14 0534 05/24/14 0618  Weight: 62.778 kg (138 lb 6.4 oz) 64 kg (141 lb 1.5 oz) 64.3 kg (141 lb 12.1 oz)    Intake/Output Summary (Last 24 hours) at 05/24/14 0936 Last data filed at 05/24/14 0000  Gross per 24 hour  Intake 2960.75 ml  Output    526 ml  Net 2434.75 ml     Vitals Filed Vitals:   05/24/14 0239 05/24/14 0618 05/24/14 0758 05/24/14 0900  BP: 124/69 92/55    Pulse: 75 69  74  Temp: 98.4 F (36.9 C) 98.5 F (  36.9 C)  97.5 F (36.4 C)  TempSrc: Oral Oral  Oral  Resp: 18 17    Height:      Weight:  64.3 kg (141 lb 12.1 oz)    SpO2: 100% 99% 100% 100%    Exam: General: No acute respiratory distress Lungs: wheeze and ronchi bilaterally appears to have improved.  Cardiovascular: Regular rate and rhythm without murmur gallop or rub normal S1 and S2 Abdomen: Nontender, nondistended, soft, bowel sounds positive, no rebound, no ascites, no appreciable mass Extremities: No significant cyanosis, clubbing, or edema bilateral lower extremities  Data Reviewed: Basic Metabolic Panel:  Recent Labs Lab 05/22/14 1825 05/23/14 0045  NA 137 137  K 3.6* 3.6*  CL 96 97  CO2 32 31  GLUCOSE 99 138*  BUN 17 14  CREATININE 0.93 0.82  CALCIUM 9.1 8.7   Liver Function Tests:  Recent Labs Lab 05/22/14 1825 05/23/14 0045  AST  17 17  ALT 8 8  ALKPHOS 95 88  BILITOT 0.9 0.8  PROT 8.0 7.4  ALBUMIN 3.7 3.4*   No results found for this basename: LIPASE, AMYLASE,  in the last 168 hours No results found for this basename: AMMONIA,  in the last 168 hours CBC:  Recent Labs Lab 05/22/14 1825 05/23/14 0045 05/23/14 1131 05/23/14 2332  WBC 5.2 4.8 5.4 6.2  HGB 9.3* 9.4* 9.2* 8.7*  HCT 29.0* 28.8* 28.9* 27.3*  MCV 99.0 99.3 99.3 99.3  PLT 205 210 223 197   Cardiac Enzymes:  Recent Labs Lab 05/23/14 0045 05/23/14 0419 05/23/14 1131  TROPONINI <0.30 <0.30 <0.30   BNP (last 3 results) No results found for this basename: PROBNP,  in the last 8760 hours CBG: No results found for this basename: GLUCAP,  in the last 168 hours  Recent Results (from the past 240 hour(s))  MRSA PCR SCREENING     Status: None   Collection Time    05/22/14 11:59 PM      Result Value Ref Range Status   MRSA by PCR NEGATIVE  NEGATIVE Final   Comment:            The GeneXpert MRSA Assay (FDA     approved for NASAL specimens     only), is one component of a     comprehensive MRSA colonization     surveillance program. It is not     intended to diagnose MRSA     infection nor to guide or     monitor treatment for     MRSA infections.  CULTURE, BLOOD (ROUTINE X 2)     Status: None   Collection Time    05/23/14 12:45 AM      Result Value Ref Range Status   Specimen Description BLOOD LEFT ARM   Final   Special Requests BOTTLES DRAWN AEROBIC ONLY 5CC   Final   Culture  Setup Time     Final   Value: 05/23/2014 08:36     Performed at [facility]   Culture     Final   Value:        BLOOD CULTURE RECEIVED NO GROWTH TO DATE CULTURE WILL BE HELD FOR 5 DAYS BEFORE ISSUING A FINAL NEGATIVE REPORT     Performed at [facility]   Report Status PENDING   Incomplete  CULTURE, BLOOD (ROUTINE X 2)     Status: None   Collection Time    05/23/14 12:55 AM      Result Value  Ref Range Status   Specimen Description BLOOD  LEFT HAND   Final   Special Requests BOTTLES DRAWN AEROBIC ONLY 10CC   Final   Culture  Setup Time     Final   Value: 05/23/2014 08:36     Performed at [facility]   Culture     Final   Value:        BLOOD CULTURE RECEIVED NO GROWTH TO DATE CULTURE WILL BE HELD FOR 5 DAYS BEFORE ISSUING A FINAL NEGATIVE REPORT     Performed at [facility]   Report Status PENDING   Incomplete     Studies:  Recent x-ray studies have been reviewed in detail by the Attending Physician  Scheduled Meds:  Scheduled Meds: . azithromycin  500 mg Oral Q24H  . calcium-vitamin D  1 tablet Oral Q breakfast  . carvedilol  3.125 mg Oral BID WC  . cefTRIAXone (ROCEPHIN)  IV  1 g Intravenous Q24H  . clopidogrel  75 mg Oral Daily  . digoxin  0.125 mg Oral Daily  . feeding supplement (ENSURE COMPLETE)  237 mL Oral BID BM  . fentaNYL  25 mcg Transdermal Q72H  . gabapentin  600 mg Oral BID  . guaiFENesin  600 mg Oral BID  . ipratropium-albuterol  3 mL Nebulization Q4H  . isosorbide mononitrate  60 mg Oral Daily  . levothyroxine  88 mcg Oral QAC breakfast  . lubiprostone  24 mcg Oral BID WC  . mometasone-formoterol  2 puff Inhalation BID  . niacin  500 mg Oral QHS  . saccharomyces boulardii  250 mg Oral BID  . simvastatin  10 mg Oral q1800  . warfarin  3 mg Oral Once per day on Sun Mon Wed Thu Fri Sat   And  . [START ON 05/29/2014] warfarin  4.5 mg Oral Once per day on Tue  . Warfarin - Pharmacist Dosing Inpatient   Does not apply q1800   Continuous Infusions: . sodium chloride 75 mL/hr at 05/22/14 2345    Time spent on care of this patient: 35 min   Travis Mcelroy, MD 05/24/2014, 9:36 AM  LOS: 2 days   [facility] Office  [phone] Pager - Text Page per [email or web address]  If 7PM-7AM, please contact night-coverage [email or web address]

## 2014-05-24 NOTE — Evaluation (Signed)
Occupational Therapy Evaluation Patient Details Name: Travis Bond MRN: [id number] DOB: [birth year] Today's Date: 05/24/2014    History of Present Illness Travis Bond is a 80 y.o. male with PMH of CKD-III, COPD, A. fib on coumadin, coronary artery disease (post status of stent placement 5 years ago), GERD, aortic stenosis, who presents with shortness of breath and hemoptysis.Patient reports that he was treated for pneumonia as outpatient 4 weeks ago. CTA showed no evidence of pulmonary embolism, but has reticular nodular infiltrate which suggests pneumonia per radiologist. Hgb 9.3 (from 11.1 in 6/15)   Clinical Impression   Pt was receiving assist from a home health aide prior to admission for ADL. Pt is severely limited by his dyspnea and poor activity tolerance.  Pt does not have 24 hour assist at home and lives in a multilevel home.  Recommending SNF for short term rehab.  Will defer further OT to SNF.   Follow Up Recommendations  SNF;Supervision/Assistance - 24 hour    Equipment Recommendations       Recommendations for Other Services       Precautions / Restrictions Precautions Precautions: Fall Precaution Comments: watch 02 sats Restrictions Weight Bearing Restrictions: No      Mobility Bed Mobility                  Transfers Overall transfer level: Needs assistance   Transfers: Stand Pivot Transfers;Sit to/from Stand Sit to Stand: Min guard Stand pivot transfers: Min assist            Balance                                            ADL Overall ADL's : Needs assistance/impaired Eating/Feeding: Independent;Sitting   Grooming: Wash/dry hands;Wash/dry face;Oral care;Sitting;Set up   Upper Body Bathing: Moderate assistance;Sitting   Lower Body Bathing: Maximal assistance;Sit to/from stand   Upper Body Dressing : Minimal assistance;Sitting   Lower Body Dressing: Maximal assistance;Sit to/from stand   Toilet Transfer:  Minimal assistance;Stand-pivot;BSC   Toileting- Clothing Manipulation and Hygiene: Maximal assistance;Sit to/from stand         General ADL Comments: Pt limited by very poor activity tolerance and dyspnea.      Vision                     Perception     Praxis      Pertinent Vitals/Pain Pain Assessment: No/denies pain     Hand Dominance Right   Extremity/Trunk Assessment Upper Extremity Assessment Upper Extremity Assessment: Generalized weakness   Lower Extremity Assessment Lower Extremity Assessment: Defer to PT evaluation   Cervical / Trunk Assessment Cervical / Trunk Assessment: Kyphotic   Communication Communication Communication: No difficulties   Cognition Arousal/Alertness: Awake/alert Behavior During Therapy: WFL for tasks assessed/performed;Flat affect Overall Cognitive Status: Within Functional Limits for tasks assessed                     General Comments       Exercises       Shoulder Instructions      Home Living Family/patient expects to be discharged to:: Private residence Living Arrangements: Children Available Help at Discharge: Available PRN/intermittently;Family Type of Home: House       Home Layout: Two level Alternate Level Stairs-Number of Steps: flight Alternate Level Stairs-Rails: Right Bathroom Shower/Tub: Tub/shower unit   Bathroom  Toilet: Standard     Home Equipment: Shower seat   Additional Comments: daughter and son in law work. Pt's bed/ bath is upstairs and family has talked about putting in a chair lift but unsure about time frame of when this can happen. Also report need to install a walk in shower instead of tub. Pt has had increasing dyspnea past month at least      Prior Functioning/Environment Level of Independence: Needs assistance  Gait / Transfers Assistance Needed: walked independently without AD ADL's / Homemaking Assistance Needed: [employer] has been assisting with bathing and home mgmt    Comments: pt has O2 at home    OT Diagnosis: Generalized weakness   OT Problem List: Decreased strength;Decreased activity tolerance;Impaired balance (sitting and/or standing);Decreased knowledge of use of DME or AE;Cardiopulmonary status limiting activity   OT Treatment/Interventions:      OT Goals(Current goals can be found in the care plan section) Acute Rehab OT Goals Patient Stated Goal: none stated  OT Frequency:     Barriers to D/C:            Co-evaluation              End of Session Equipment Utilized During Treatment: Oxygen (4L, left at 4L per RN) Nurse Communication:  (02 sats)  Activity Tolerance: Patient limited by fatigue Patient left: in bed;with call bell/phone within reach   Time: 1045-1130 OT Time Calculation (min): 45 min Charges:  OT General Charges $OT Visit: 1 Procedure OT Evaluation $Initial OT Evaluation Tier I: 1 Procedure OT Treatments $Self Care/Home Management : 23-37 mins G-Codes:    Evern Bio 05/24/2014, 11:40 AM [phone]

## 2014-05-25 LAB — EXPECTORATED SPUTUM ASSESSMENT W GRAM STAIN, RFLX TO RESP C

## 2014-05-25 LAB — BASIC METABOLIC PANEL
ANION GAP: 9 (ref 5–15)
BUN: 14 mg/dL (ref 6–23)
CHLORIDE: 106 meq/L (ref 96–112)
CO2: 27 meq/L (ref 19–32)
Calcium: 8.4 mg/dL (ref 8.4–10.5)
Creatinine, Ser: 0.8 mg/dL (ref 0.50–1.35)
GFR calc Af Amer: >90 mL/min (ref >90)
GFR calc non Af Amer: 80 mL/min — ABNORMAL LOW (ref >90)
Glucose, Bld: 104 mg/dL — ABNORMAL HIGH (ref 70–99)
Potassium: 4.5 mEq/L (ref 3.7–5.3)
SODIUM: 142 meq/L (ref 137–147)

## 2014-05-25 LAB — PROTIME-INR
INR: 2.56 — AB (ref 0.00–1.49)
PROTHROMBIN TIME: 27.5 s — AB (ref 11.6–15.2)

## 2014-05-25 LAB — CBC
HCT: 26.5 % — ABNORMAL LOW (ref 39.0–52.0)
HEMOGLOBIN: 8.3 g/dL — AB (ref 13.0–17.0)
MCH: 31.7 pg (ref 26.0–34.0)
MCHC: 31.3 g/dL (ref 30.0–36.0)
MCV: 101.1 fL — ABNORMAL HIGH (ref 78.0–100.0)
Platelets: 177 10*3/uL (ref 150–400)
RBC: 2.62 MIL/uL — ABNORMAL LOW (ref 4.22–5.81)
RDW: 17.6 % — ABNORMAL HIGH (ref 11.5–15.5)
WBC: 6.2 10*3/uL (ref 4.0–10.5)

## 2014-05-25 LAB — EXPECTORATED SPUTUM ASSESSMENT W REFEX TO RESP CULTURE

## 2014-05-25 NOTE — Progress Notes (Signed)
Physical Therapy Treatment Patient Details Name: Travis Bond MRN: [id number] DOB: [birth year] Today's Date: 05/25/2014    History of Present Illness Travis Bond is a 80 y.o. male with PMH of CKD-III, COPD, A. fib on coumadin, coronary artery disease (post status of stent placement 5 years ago), GERD, aortic stenosis, who presents with shortness of breath and hemoptysis.Patient reports that he was treated for pneumonia as outpatient 4 weeks ago. CTA showed no evidence of pulmonary embolism, but has reticular nodular infiltrate which suggests pneumonia per radiologist. Hgb 9.3 (from 11.1 in 6/15)    PT Comments    Pt progressing towards physical therapy goals. Required increase in supplemental O2 from 3L/min to 5L/min during gait training, as sats decreased to 74% after 50 feet. Pt demonstrated good technique for pursed-lip breathing, and was educated on general energy conservation techniques. Will continue to follow.   Follow Up Recommendations  SNF;Supervision/Assistance - 24 hour     Equipment Recommendations  Other (comment) (Rollator if home)    Recommendations for Other Services OT consult     Precautions / Restrictions Precautions Precautions: Fall Precaution Comments: watch 02 sats Restrictions Weight Bearing Restrictions: No    Mobility  Bed Mobility Overal bed mobility: Modified Independent             General bed mobility comments: Pt demonstrated proper hand placement and safety awareness.   Transfers Overall transfer level: Needs assistance Equipment used: Rolling walker (2 wheeled) Transfers: Sit to/from [employer] Sit to Stand: Min guard Stand pivot transfers: Min guard       General transfer comment: VC's for hand placement on seated surface for safety. Transferred bed>BSC prior to ambulation.   Ambulation/Gait Ambulation/Gait assistance: Min guard Ambulation Distance (Feet): 50 Feet Assistive device: Rolling walker (2  wheeled) Gait Pattern/deviations: Step-through pattern;Decreased stride length;Trunk flexed;Narrow base of support Gait velocity: Decreased Gait velocity interpretation: Below normal speed for age/gender General Gait Details: Pt began ambulation on 4L/min supplemental O2 due to decreased sats to 87% jsut from transferring to [facility]. After 19' of ambulation pt took a standing rest break and sats were checked - 74%. O2 was increased to 5L/min and pt was returned to room in the recliner chair. Upon return to room sats were in high 80's and pt returned to 3L/min supplemental O2 as he was on originally.    Stairs            Wheelchair Mobility    Modified Rankin (Stroke Patients Only)       Balance Overall balance assessment: Needs assistance Sitting-balance support: Feet supported;No upper extremity supported Sitting balance-Leahy Scale: Fair     Standing balance support: Bilateral upper extremity supported;During functional activity Standing balance-Leahy Scale: Poor Standing balance comment: Pt required UE support for balance as well as energy conservation.                     Cognition Arousal/Alertness: Awake/alert Behavior During Therapy: WFL for tasks assessed/performed;Flat affect Overall Cognitive Status: Within Functional Limits for tasks assessed                      Exercises      General Comments        Pertinent Vitals/Pain Pain Assessment: No/denies pain    Home Living                      Prior Function  PT Goals (current goals can now be found in the care plan section) Acute Rehab PT Goals Patient Stated Goal: Walk more PT Goal Formulation: With patient Time For Goal Achievement: 06/06/14 Potential to Achieve Goals: Fair Progress towards PT goals: Progressing toward goals    Frequency  Min 2X/week    PT Plan Current plan remains appropriate    Co-evaluation             End of Session Equipment  Utilized During Treatment: Gait belt;Oxygen Activity Tolerance: Patient limited by fatigue Patient left: in chair;with call bell/phone within reach;Other (comment) (RT in room)     Time: 1610-9604 PT Time Calculation (min): 26 min  Charges:  $Gait Training: 8-22 mins $Therapeutic Activity: 8-22 mins                    G Codes:      Ruthann Cancer Jun 06, 2014, 5:30 PM  Ruthann Cancer, PT, DPT Acute Rehabilitation Services Pager: [phone]

## 2014-05-25 NOTE — Progress Notes (Signed)
CCMD called stating that pt's HR was spiking to 160's.  V/S were taken at 1535, and were stable.  Pt was in no distress. Physician notified.

## 2014-05-25 NOTE — Progress Notes (Signed)
[facility] Progress Note   Audy Dauphine VWU:[id number] DOB: [birth year] DOA: 05/22/2014 PCP: No primary provider on file.  Brief narrative: Travis Bond is a 80 y.o. male admitted with pneumonia with symptoms of hemoptysis. He was recently treated for CAP 4 wks ago with 7 days. He developed recurrent symptoms about 2 wks ago and now is coughing up blood as well. 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    Subjective: Improved but persistent cough and dyspnea.   Assessment/Plan: Principal Problem:   Community acquired pneumonia - second episode - urine strep antigen negative - Rocephin and Zithromax, currently on 3 lit [place] oxygen.  -Keep sats greater than 90%. Complete the course of the antibiotics.  - plan for SNF when medically stable.   Active Problems:   C O P D - cont O2- chronically on 2.5 L at home - Nebs routinely Q 4 hrs and PRN    Atrial fibrillation - cont Dig, Coreg and Coumadin. INR therapeutic.      Drug eluting stent - CAD - Cont Plavix  AOCD - Hb stable  Code Status: Full code Family Communication: none at bedside.  Disposition Plan: SNF when stable.  DVT prophylaxis: Coumadin  Consultants: cardiology Procedures: none  Antibiotics: Anti-infectives   Start     Dose/Rate Route Frequency Ordered Stop   05/23/14 2200  azithromycin (ZITHROMAX) tablet 500 mg     500 mg Oral Every 24 hours 05/22/14 2341 05/30/14 2159   05/23/14 2200  cefTRIAXone (ROCEPHIN) 1 g in dextrose 5 % 50 mL IVPB     1 g 100 mL/hr over 30 Minutes Intravenous Every 24 hours 05/22/14 2341 05/30/14 2159   05/23/14 0000  cefTRIAXone (ROCEPHIN) 1 g in dextrose 5 % 50 mL IVPB  Status:  Discontinued     1 g 100 mL/hr over 30 Minutes Intravenous Every 24 hours 05/22/14 2340 05/22/14 2341   05/22/14 2315  cefTRIAXone (ROCEPHIN) 1 g in dextrose 5 % 50 mL IVPB  Status:  Discontinued     1 g 100 mL/hr over 30 Minutes  Intravenous Every 24 hours 05/22/14 2300 05/22/14 2339   05/22/14 2315  azithromycin (ZITHROMAX) tablet 500 mg  Status:  Discontinued     500 mg Oral Every 24 hours 05/22/14 2300 05/22/14 2340   05/22/14 2200  cefTRIAXone (ROCEPHIN) 1 g in dextrose 5 % 50 mL IVPB  Status:  Discontinued     1 g 100 mL/hr over 30 Minutes Intravenous Every 24 hours 05/22/14 2146 05/22/14 2300   05/22/14 2200  azithromycin (ZITHROMAX) 500 mg in dextrose 5 % 250 mL IVPB     500 mg 250 mL/hr over 60 Minutes Intravenous  Once 05/22/14 2146 05/22/14 2350         Objective: Filed Weights   05/23/14 0534 05/24/14 0618 05/25/14 0622  Weight: 64 kg (141 lb 1.5 oz) 64.3 kg (141 lb 12.1 oz) 63.821 kg (140 lb 11.2 oz)    Intake/Output Summary (Last 24 hours) at 05/25/14 1721 Last data filed at 05/25/14 0956  Gross per 24 hour  Intake    240 ml  Output    475 ml  Net   -235 ml     Vitals Filed Vitals:   05/25/14 1400 05/25/14 1535 05/25/14 1604 05/25/14 1624  BP: 118/54 126/53  126/65  Pulse: 78 78  79  Temp:  97.4 F (36.3 C)    TempSrc:  Oral  Resp:  20    Height:      Weight:      SpO2:  97% 90%     Exam: General: No acute respiratory distress Lungs: wheeze and ronchi bilaterally appears to have improved.  Cardiovascular: Regular rate and rhythm without murmur gallop or rub normal S1 and S2 Abdomen: Nontender, nondistended, soft, bowel sounds positive, no rebound, no ascites, no appreciable mass Extremities: No significant cyanosis, clubbing, or edema bilateral lower extremities  Data Reviewed: Basic Metabolic Panel:  Recent Labs Lab 05/22/14 1825 05/23/14 0045 05/25/14 0434  NA 137 137 142  K 3.6* 3.6* 4.5  CL 96 97 106  CO2 32 31 27  GLUCOSE 99 138* 104*  BUN CREATININE 0.93 0.82 0.80  CALCIUM 9.1 8.7 8.4   Liver Function Tests:  Recent Labs Lab 05/22/14 1825 05/23/14 0045  AST 17 17  ALT 8 8  ALKPHOS 95 88  BILITOT 0.9 0.8  PROT 8.0 7.4  ALBUMIN 3.7 3.4*    No results found for this basename: LIPASE, AMYLASE,  in the last 168 hours No results found for this basename: AMMONIA,  in the last 168 hours CBC:  Recent Labs Lab 05/23/14 0045 05/23/14 1131 05/23/14 2332 05/24/14 1045 05/25/14 0434  WBC 4.8 5.4 6.2 5.2 6.2  HGB 9.4* 9.2* 8.7* 8.4* 8.3*  HCT 28.8* 28.9* 27.3* 27.2* 26.5*  MCV 99.3 99.3 99.3 101.5* 101.1*  PLT 210 223 197 186 177   Cardiac Enzymes:  Recent Labs Lab 05/23/14 0045 05/23/14 0419 05/23/14 1131  TROPONINI <0.30 <0.30 <0.30   BNP (last 3 results) No results found for this basename: PROBNP,  in the last 8760 hours CBG: No results found for this basename: GLUCAP,  in the last 168 hours  Recent Results (from the past 240 hour(s))  MRSA PCR SCREENING     Status: None   Collection Time    05/22/14 11:59 PM      Result Value Ref Range Status   MRSA by PCR NEGATIVE  NEGATIVE Final   Comment:            The GeneXpert MRSA Assay (FDA     approved for NASAL specimens     only), is one component of a     comprehensive MRSA colonization     surveillance program. It is not     intended to diagnose MRSA     infection nor to guide or     monitor treatment for     MRSA infections.  CULTURE, BLOOD (ROUTINE X 2)     Status: None   Collection Time    05/23/14 12:45 AM      Result Value Ref Range Status   Specimen Description BLOOD LEFT ARM   Final   Special Requests BOTTLES DRAWN AEROBIC ONLY 5CC   Final   Culture  Setup Time     Final   Value: 05/23/2014 08:36     Performed at [facility]   Culture     Final   Value:        BLOOD CULTURE RECEIVED NO GROWTH TO DATE CULTURE WILL BE HELD FOR 5 DAYS BEFORE ISSUING A FINAL NEGATIVE REPORT     Performed at [facility]   Report Status PENDING   Incomplete  CULTURE, BLOOD (ROUTINE X 2)     Status: None   Collection Time    05/23/14 12:55 AM      Result Value Ref Range  Status   Specimen Description BLOOD LEFT HAND   Final   Special Requests  BOTTLES DRAWN AEROBIC ONLY 10CC   Final   Culture  Setup Time     Final   Value: 05/23/2014 08:36     Performed at [facility]   Culture     Final   Value:        BLOOD CULTURE RECEIVED NO GROWTH TO DATE CULTURE WILL BE HELD FOR 5 DAYS BEFORE ISSUING A FINAL NEGATIVE REPORT     Performed at [facility]   Report Status PENDING   Incomplete  CULTURE, EXPECTORATED SPUTUM-ASSESSMENT     Status: None   Collection Time    05/25/14  3:32 PM      Result Value Ref Range Status   Specimen Description SPUTUM   Final   Special Requests NONE   Final   Sputum evaluation     Final   Value: THIS SPECIMEN IS ACCEPTABLE. RESPIRATORY CULTURE REPORT TO FOLLOW.   Report Status 05/25/2014 FINAL   Final     Studies:  Recent x-ray studies have been reviewed in detail by the Attending Physician  Scheduled Meds:  Scheduled Meds: . azithromycin  500 mg Oral Q24H  . calcium-vitamin D  1 tablet Oral Q breakfast  . carvedilol  3.125 mg Oral BID WC  . cefTRIAXone (ROCEPHIN)  IV  1 g Intravenous Q24H  . clopidogrel  75 mg Oral Daily  . digoxin  0.125 mg Oral Daily  . feeding supplement (ENSURE COMPLETE)  237 mL Oral BID BM  . fentaNYL  25 mcg Transdermal Q72H  . gabapentin  600 mg Oral BID  . guaiFENesin  600 mg Oral BID  . ipratropium-albuterol  3 mL Nebulization Q4H  . isosorbide mononitrate  60 mg Oral Daily  . levothyroxine  88 mcg Oral QAC breakfast  . lubiprostone  24 mcg Oral BID WC  . mometasone-formoterol  2 puff Inhalation BID  . niacin  500 mg Oral QHS  . saccharomyces boulardii  250 mg Oral BID  . simvastatin  10 mg Oral q1800  . warfarin  3 mg Oral Once per day on Sun Mon Wed Thu Fri Sat   And  . [START ON 05/29/2014] warfarin  4.5 mg Oral Once per day on Tue  . Warfarin - Pharmacist Dosing Inpatient   Does not apply q1800   Continuous Infusions:    Time spent on care of this patient: 35 min   Ashayla Subia, MD 05/25/2014, 5:21 PM  LOS: 3 days   [facility] Office  [phone] Pager - Text Page per [email or web address]  If 7PM-7AM, please contact night-coverage [email or web address]

## 2014-05-25 NOTE — Progress Notes (Signed)
ANTICOAGULATION CONSULT NOTE - Follow up  Pharmacy Consult for Coumadin Indication: atrial fibrillation  No Known Allergies  Patient Measurements: Height: 5' 10.5" (179.1 cm) Weight: 140 lb 11.2 oz (63.821 kg) (bed scale; sob) IBW/kg (Calculated) : 74.15  Vital Signs: Temp: 97.4 F (36.3 C) (09/25 1535) Temp src: Oral (09/25 1535) BP: 126/53 mmHg (09/25 1535) Pulse Rate: 78 (09/25 1535)  Labs:  Recent Labs  05/22/14 1825 05/23/14 0045 05/23/14 0419 05/23/14 0920 05/23/14 1131 05/23/14 2332 05/24/14 0549 05/24/14 1045 05/25/14 0434  HGB 9.3* 9.4*  --   --  9.2* 8.7*  --  8.4* 8.3*  HCT 29.0* 28.8*  --   --  28.9* 27.3*  --  27.2* 26.5*  PLT 205 210  --   --  223 197  --  186 177  LABPROT 28.2*  --   --  25.8*  --   --  26.9*  --  27.5*  INR 2.64*  --   --  2.36*  --   --  2.49*  --  2.56*  CREATININE 0.93 0.82  --   --   --   --   --   --  0.80  TROPONINI  --  <0.30 <0.30  --  <0.30  --   --   --   --     Estimated Creatinine Clearance: 62 ml/min (by C-G formula based on Cr of 0.8).   Assessment: 80 yo male who continues on coumadin for h/o afib.  Home regimen: Coumadin 3 mg daily except 4.5 mg every Tues. No bleeding noted. H/o anemia.  Hgb 8.3 and  PLTC is 177K.  CAP on day #4/5 Azithromycin-hich may increase coumadin effect. INR today of 2.56 remains therapeutic on his usual home dosage. Thick bloody sputum per RN note. No other bleeding noted.   Goal of Therapy:  INR 2-3 Monitor platelets by anticoagulation protocol: Yes   Plan:  Continue home regimen: Coumadin 3 mg daily except 4.5 mg every Tues.  Daily INR  Herby Abraham, Pharm.D. [phone] 05/25/2014 4:06 PM

## 2014-05-26 LAB — PROTIME-INR
INR: 2.48 — AB (ref 0.00–1.49)
Prothrombin Time: 26.8 seconds — ABNORMAL HIGH (ref 11.6–15.2)

## 2014-05-26 NOTE — Progress Notes (Signed)
[facility] Progress Note   Travis Bond ZOX:[id number] DOB: [birth year] DOA: 05/22/2014 PCP: No primary provider on file.  Brief narrative: Travis Bond is a 80 y.o. male admitted with pneumonia with symptoms of hemoptysis. He was recently treated for CAP 4 wks ago with 7 days. He developed recurrent symptoms about 2 wks ago and now is coughing up blood as well. 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    Subjective: Nose bleeding this AM, hypoxic with pulse ox in mid 80s on 2 L -   Assessment/Plan: Principal Problem:   Community acquired pneumonia - second episode- - urine strep antigen negative - Rocephin and Zithromax -Keep sats greater than 90%.- giving STAT neb now - follow  - plan for SNF when medically stable.   Active Problems:   C O P D - cont O2- chronically on 2.5 L at home - Nebs routinely Q 4 hrs and PRN    Atrial fibrillation - cont Dig, Coreg and Coumadin. INR therapeutic.      Drug eluting stent - CAD - Cont Plavix  AOCD - Hb stable  Code Status: Full code Family Communication: none at bedside.  Disposition Plan: SNF when stable.  DVT prophylaxis: Coumadin  Consultants: cardiology Procedures: none  Antibiotics: Anti-infectives   Start     Dose/Rate Route Frequency Ordered Stop   05/23/14 2200  azithromycin (ZITHROMAX) tablet 500 mg     500 mg Oral Every 24 hours 05/22/14 2341 05/30/14 2159   05/23/14 2200  cefTRIAXone (ROCEPHIN) 1 g in dextrose 5 % 50 mL IVPB     1 g 100 mL/hr over 30 Minutes Intravenous Every 24 hours 05/22/14 2341 05/30/14 2159   05/23/14 0000  cefTRIAXone (ROCEPHIN) 1 g in dextrose 5 % 50 mL IVPB  Status:  Discontinued     1 g 100 mL/hr over 30 Minutes Intravenous Every 24 hours 05/22/14 2340 05/22/14 2341   05/22/14 2315  cefTRIAXone (ROCEPHIN) 1 g in dextrose 5 % 50 mL IVPB  Status:  Discontinued     1 g 100 mL/hr over 30 Minutes Intravenous Every 24 hours  05/22/14 2300 05/22/14 2339   05/22/14 2315  azithromycin (ZITHROMAX) tablet 500 mg  Status:  Discontinued     500 mg Oral Every 24 hours 05/22/14 2300 05/22/14 2340   05/22/14 2200  cefTRIAXone (ROCEPHIN) 1 g in dextrose 5 % 50 mL IVPB  Status:  Discontinued     1 g 100 mL/hr over 30 Minutes Intravenous Every 24 hours 05/22/14 2146 05/22/14 2300   05/22/14 2200  azithromycin (ZITHROMAX) 500 mg in dextrose 5 % 250 mL IVPB     500 mg 250 mL/hr over 60 Minutes Intravenous  Once 05/22/14 2146 05/22/14 2350         Objective: Filed Weights   05/24/14 0618 05/25/14 0622 05/26/14 0504  Weight: 64.3 kg (141 lb 12.1 oz) 63.821 kg (140 lb 11.2 oz) 69.174 kg (152 lb 8 oz)    Intake/Output Summary (Last 24 hours) at 05/26/14 1112 Last data filed at 05/26/14 0730  Gross per 24 hour  Intake    390 ml  Output   1650 ml  Net  -1260 ml     Vitals Filed Vitals:   05/25/14 2324 05/26/14 0315 05/26/14 0504 05/26/14 0832  BP:   103/48   Pulse:   83   Temp:   97.9 F (36.6 C)   TempSrc:   Oral  Resp:   19   Height:      Weight:   69.174 kg (152 lb 8 oz)   SpO2: 95% 95% 99% 90%    Exam: General: No acute respiratory distress Lungs: wheeze and ronchi bilaterally appears to have improved.  Cardiovascular: Regular rate and rhythm without murmur gallop or rub normal S1 and S2 Abdomen: Nontender, nondistended, soft, bowel sounds positive, no rebound, no ascites, no appreciable mass Extremities: No significant cyanosis, clubbing, or edema bilateral lower extremities  Data Reviewed: Basic Metabolic Panel:  Recent Labs Lab 05/22/14 1825 05/23/14 0045 05/25/14 0434  NA 137 137 142  K 3.6* 3.6* 4.5  CL 96 97 106  CO2 32 31 27  GLUCOSE 99 138* 104*  BUN CREATININE 0.93 0.82 0.80  CALCIUM 9.1 8.7 8.4   Liver Function Tests:  Recent Labs Lab 05/22/14 1825 05/23/14 0045  AST 17 17  ALT 8 8  ALKPHOS 95 88  BILITOT 0.9 0.8  PROT 8.0 7.4  ALBUMIN 3.7 3.4*   No  results found for this basename: LIPASE, AMYLASE,  in the last 168 hours No results found for this basename: AMMONIA,  in the last 168 hours CBC:  Recent Labs Lab 05/23/14 0045 05/23/14 1131 05/23/14 2332 05/24/14 1045 05/25/14 0434  WBC 4.8 5.4 6.2 5.2 6.2  HGB 9.4* 9.2* 8.7* 8.4* 8.3*  HCT 28.8* 28.9* 27.3* 27.2* 26.5*  MCV 99.3 99.3 99.3 101.5* 101.1*  PLT 210 223 197 186 177   Cardiac Enzymes:  Recent Labs Lab 05/23/14 0045 05/23/14 0419 05/23/14 1131  TROPONINI <0.30 <0.30 <0.30   BNP (last 3 results) No results found for this basename: PROBNP,  in the last 8760 hours CBG: No results found for this basename: GLUCAP,  in the last 168 hours  Recent Results (from the past 240 hour(s))  MRSA PCR SCREENING     Status: None   Collection Time    05/22/14 11:59 PM      Result Value Ref Range Status   MRSA by PCR NEGATIVE  NEGATIVE Final   Comment:            The GeneXpert MRSA Assay (FDA     approved for NASAL specimens     only), is one component of a     comprehensive MRSA colonization     surveillance program. It is not     intended to diagnose MRSA     infection nor to guide or     monitor treatment for     MRSA infections.  CULTURE, BLOOD (ROUTINE X 2)     Status: None   Collection Time    05/23/14 12:45 AM      Result Value Ref Range Status   Specimen Description BLOOD LEFT ARM   Final   Special Requests BOTTLES DRAWN AEROBIC ONLY 5CC   Final   Culture  Setup Time     Final   Value: 05/23/2014 08:36     Performed at [facility]   Culture     Final   Value:        BLOOD CULTURE RECEIVED NO GROWTH TO DATE CULTURE WILL BE HELD FOR 5 DAYS BEFORE ISSUING A FINAL NEGATIVE REPORT     Performed at [facility]   Report Status PENDING   Incomplete  CULTURE, BLOOD (ROUTINE X 2)     Status: None   Collection Time    05/23/14 12:55 AM  Result Value Ref Range Status   Specimen Description BLOOD LEFT HAND   Final   Special Requests BOTTLES  DRAWN AEROBIC ONLY 10CC   Final   Culture  Setup Time     Final   Value: 05/23/2014 08:36     Performed at [facility]   Culture     Final   Value:        BLOOD CULTURE RECEIVED NO GROWTH TO DATE CULTURE WILL BE HELD FOR 5 DAYS BEFORE ISSUING A FINAL NEGATIVE REPORT     Performed at [facility]   Report Status PENDING   Incomplete  CULTURE, EXPECTORATED SPUTUM-ASSESSMENT     Status: None   Collection Time    05/25/14  3:32 PM      Result Value Ref Range Status   Specimen Description SPUTUM   Final   Special Requests NONE   Final   Sputum evaluation     Final   Value: THIS SPECIMEN IS ACCEPTABLE. RESPIRATORY CULTURE REPORT TO FOLLOW.   Report Status 05/25/2014 FINAL   Final  CULTURE, RESPIRATORY (NON-EXPECTORATED)     Status: None   Collection Time    05/25/14  3:32 PM      Result Value Ref Range Status   Specimen Description SPUTUM   Final   Special Requests NONE   Final   Gram Stain     Final   Value: FEW WBC PRESENT, PREDOMINANTLY MONONUCLEAR     RARE SQUAMOUS EPITHELIAL CELLS PRESENT     NO ORGANISMS SEEN     Performed at [facility]   Culture PENDING   Incomplete   Report Status PENDING   Incomplete     Studies:  Recent x-ray studies have been reviewed in detail by the Attending Physician  Scheduled Meds:  Scheduled Meds: . azithromycin  500 mg Oral Q24H  . calcium-vitamin D  1 tablet Oral Q breakfast  . carvedilol  3.125 mg Oral BID WC  . cefTRIAXone (ROCEPHIN)  IV  1 g Intravenous Q24H  . clopidogrel  75 mg Oral Daily  . digoxin  0.125 mg Oral Daily  . feeding supplement (ENSURE COMPLETE)  237 mL Oral BID BM  . fentaNYL  25 mcg Transdermal Q72H  . gabapentin  600 mg Oral BID  . guaiFENesin  600 mg Oral BID  . ipratropium-albuterol  3 mL Nebulization Q4H  . isosorbide mononitrate  60 mg Oral Daily  . levothyroxine  88 mcg Oral QAC breakfast  . lubiprostone  24 mcg Oral BID WC  . mometasone-formoterol  2 puff Inhalation BID  .  niacin  500 mg Oral QHS  . saccharomyces boulardii  250 mg Oral BID  . simvastatin  10 mg Oral q1800  . warfarin  3 mg Oral Once per day on Sun Mon Wed Thu Fri Sat   And  . [START ON 05/29/2014] warfarin  4.5 mg Oral Once per day on Tue  . Warfarin - Pharmacist Dosing Inpatient   Does not apply q1800   Continuous Infusions:    Time spent on care of this patient: 35 min   Guiselle Mian, MD 05/26/2014, 11:12 AM  LOS: 4 days   [facility] Office  [phone] Pager - Text Page per [email or web address]  If 7PM-7AM, please contact night-coverage [email or web address]

## 2014-05-26 NOTE — ED Provider Notes (Signed)
I saw and evaluated the patient, reviewed the resident's note and I agree with the findings and plan.   EKG Interpretation   Date/Time:  Tuesday May 22 2014 17:55:16 EDT Ventricular Rate:  81 PR Interval:  163 QRS Duration: 101 QT Interval:  366 QTC Calculation: 425 R Axis:   83 Text Interpretation:  Sinus rhythm Atrial premature complex Non-specific  ST-t changes Confirmed by Banks Chaikin  MD, Caryn Bee ([id number]) on 05/22/2014 8:01:49  PM      Pt c/o cough, and spitting up small amt blood w coughing spells. Upper resp cong and non prod cough in ed. Cxr. Labs.   Suzi Roots, MD 05/26/14 2120

## 2014-05-26 NOTE — Progress Notes (Signed)
ANTICOAGULATION CONSULT NOTE - Follow Up Consult  Pharmacy Consult for coumadin Indication: atrial fibrillation  No Known Allergies  Patient Measurements: Height: 5' 10.5" (179.1 cm) Weight: 152 lb 8 oz (69.174 kg) (scale b) IBW/kg (Calculated) : 74.15 Heparin Dosing Weight:   Vital Signs: Temp: 97.9 F (36.6 C) (09/26 0504) Temp src: Oral (09/26 0504) BP: 103/48 mmHg (09/26 0504) Pulse Rate: 83 (09/26 0504)  Labs:  Recent Labs  05/23/14 1131 05/23/14 2332 05/24/14 0549 05/24/14 1045 05/25/14 0434 05/26/14 0515  HGB 9.2* 8.7*  --  8.4* 8.3*  --   HCT 28.9* 27.3*  --  27.2* 26.5*  --   PLT 223 197  --  186 177  --   LABPROT  --   --  26.9*  --  27.5* 26.8*  INR  --   --  2.49*  --  2.56* 2.48*  CREATININE  --   --   --   --  0.80  --   TROPONINI <0.30  --   --   --   --   --     Estimated Creatinine Clearance: 67.3 ml/min (by C-G formula based on Cr of 0.8).   Medications:  Scheduled:  . azithromycin  500 mg Oral Q24H  . calcium-vitamin D  1 tablet Oral Q breakfast  . carvedilol  3.125 mg Oral BID WC  . cefTRIAXone (ROCEPHIN)  IV  1 g Intravenous Q24H  . clopidogrel  75 mg Oral Daily  . digoxin  0.125 mg Oral Daily  . feeding supplement (ENSURE COMPLETE)  237 mL Oral BID BM  . fentaNYL  25 mcg Transdermal Q72H  . gabapentin  600 mg Oral BID  . guaiFENesin  600 mg Oral BID  . ipratropium-albuterol  3 mL Nebulization Q4H  . isosorbide mononitrate  60 mg Oral Daily  . levothyroxine  88 mcg Oral QAC breakfast  . lubiprostone  24 mcg Oral BID WC  . mometasone-formoterol  2 puff Inhalation BID  . niacin  500 mg Oral QHS  . saccharomyces boulardii  250 mg Oral BID  . simvastatin  10 mg Oral q1800  . warfarin  3 mg Oral Once per day on Sun Mon Wed Thu Fri Sat   And  . [START ON 05/29/2014] warfarin  4.5 mg Oral Once per day on Tue  . Warfarin - Pharmacist Dosing Inpatient   Does not apply q1800   Infusions:    Assessment: 80 yo male with afib is currently  on therapeutic coumadin.  INR today is 2.48 Goal of Therapy:  INR 2-3 Monitor platelets by anticoagulation protocol: Yes   Plan:  Cont home regimen: Coumadin 3 mg QD except 4.5 mg qTue.  qAM INR   Cailen Mihalik, Tsz-Yin 05/26/2014,10:42 AM

## 2014-05-27 LAB — CULTURE, RESPIRATORY

## 2014-05-27 LAB — CULTURE, RESPIRATORY W GRAM STAIN: Culture: NORMAL

## 2014-05-27 LAB — PROTIME-INR
INR: 2.16 — ABNORMAL HIGH (ref 0.00–1.49)
Prothrombin Time: 24.1 seconds — ABNORMAL HIGH (ref 11.6–15.2)

## 2014-05-27 NOTE — Progress Notes (Signed)
ANTICOAGULATION CONSULT NOTE - Follow Up Consult  Pharmacy Consult for coumadin Indication: atrial fibrillation  No Known Allergies  Patient Measurements: Height: 5' 10.5" (179.1 cm) Weight: 145 lb 15.1 oz (66.2 kg) IBW/kg (Calculated) : 74.15 Heparin Dosing Weight:   Vital Signs: Temp: 98.4 F (36.9 C) (09/27 0516) Temp src: Oral (09/27 0516) BP: 99/53 mmHg (09/27 0516) Pulse Rate: 78 (09/27 0516)  Labs:  Recent Labs  05/24/14 1045 05/25/14 0434 05/26/14 0515 05/27/14 0415  HGB 8.4* 8.3*  --   --   HCT 27.2* 26.5*  --   --   PLT 186 177  --   --   LABPROT  --  27.5* 26.8* 24.1*  INR  --  2.56* 2.48* 2.16*  CREATININE  --  0.80  --   --     Estimated Creatinine Clearance: 64.4 ml/min (by C-G formula based on Cr of 0.8).   Medications:  Scheduled:  . azithromycin  500 mg Oral Q24H  . calcium-vitamin D  1 tablet Oral Q breakfast  . carvedilol  3.125 mg Oral BID WC  . cefTRIAXone (ROCEPHIN)  IV  1 g Intravenous Q24H  . clopidogrel  75 mg Oral Daily  . digoxin  0.125 mg Oral Daily  . feeding supplement (ENSURE COMPLETE)  237 mL Oral BID BM  . fentaNYL  25 mcg Transdermal Q72H  . gabapentin  600 mg Oral BID  . guaiFENesin  600 mg Oral BID  . ipratropium-albuterol  3 mL Nebulization Q4H  . isosorbide mononitrate  60 mg Oral Daily  . levothyroxine  88 mcg Oral QAC breakfast  . lubiprostone  24 mcg Oral BID WC  . mometasone-formoterol  2 puff Inhalation BID  . niacin  500 mg Oral QHS  . saccharomyces boulardii  250 mg Oral BID  . simvastatin  10 mg Oral q1800  . warfarin  3 mg Oral Once per day on Sun Mon Wed Thu Fri Sat   And  . [START ON 05/29/2014] warfarin  4.5 mg Oral Once per day on Tue  . Warfarin - Pharmacist Dosing Inpatient   Does not apply q1800   Infusions:    Assessment: 80 yo male with afib is currently on therapeutic coumadin.  INR is 2.16 today. Goal of Therapy:  INR 2-3 Monitor platelets by anticoagulation protocol: Yes   Plan:  Cont  home regimen: Coumadin 3 mg QD except 4.5 mg qTue.  qAM INR   Tzippy Testerman, Tsz-Yin 05/27/2014,10:42 AM

## 2014-05-27 NOTE — Significant Event (Signed)
SATURATION QUALIFICATIONS: (This note is used to comply with regulatory documentation for home oxygen)  Patient Saturations on Room Air at Rest = 85%  Patient Saturations on Room Air while Ambulating =82%  Patient Saturations on 3 Liters of oxygen while Ambulating = 92%  Please briefly explain why patient needs home oxygen:PT not able to maintain O2 sats without O2

## 2014-05-27 NOTE — Clinical Social Work Note (Signed)
CSW continues to follow this patient for d/c planning needs. CSW provided bed offer to patient's daughter Luster Landsberg). Luster Landsberg is agreeable to [facility]. CSW contacted facility and made [facility] aware. CSW to follow tomorrow.  Taisei Bonnette Patrick-Jefferson, LCSWA Weekend Clinical Social Worker [phone]

## 2014-05-27 NOTE — Progress Notes (Signed)
[facility] Progress Note   Travis Bond ZOX:[id number] DOB: 04-Jul-1927 DOA: 05/22/2014 PCP: No primary provider on file.  Brief narrative: Travis Bond is a 80 y.o. male admitted with pneumonia with symptoms of hemoptysis. He was recently treated for CAP 4 wks ago with 7 days. He developed recurrent symptoms about 2 wks ago and now is coughing up blood as well. 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    Subjective: Still coughing up clots of blood- breathing much better than yesterday  Assessment/Plan: Principal Problem:   Community acquired pneumonia with severe underlying COPD - second episode- - urine strep antigen negative - Rocephin and Zithromax -Keep sats greater than 90%.-  - check pulse ox with ambulation today - plan for SNF when medically stable.   Active Problems:   C O P D - cont O2- chronically on 2.5 L at home - Nebs routinely Q 4 hrs and PRN    Atrial fibrillation - cont Dig, Coreg and Coumadin. INR therapeutic.      Drug eluting stent - CAD - Cont Plavix  Nose bleed - on 9/26- resolved  AOCD - Hb stable  Code Status: Full code Family Communication: none at bedside.  Disposition Plan: SNF when stable.  DVT prophylaxis: Coumadin  Consultants: cardiology Procedures: none  Antibiotics: Anti-infectives   Start     Dose/Rate Route Frequency Ordered Stop   05/23/14 2200  azithromycin (ZITHROMAX) tablet 500 mg     500 mg Oral Every 24 hours 05/22/14 2341 05/30/14 2159   05/23/14 2200  cefTRIAXone (ROCEPHIN) 1 g in dextrose 5 % 50 mL IVPB     1 g 100 mL/hr over 30 Minutes Intravenous Every 24 hours 05/22/14 2341 05/30/14 2159   05/23/14 0000  cefTRIAXone (ROCEPHIN) 1 g in dextrose 5 % 50 mL IVPB  Status:  Discontinued     1 g 100 mL/hr over 30 Minutes Intravenous Every 24 hours 05/22/14 2340 05/22/14 2341   05/22/14 2315  cefTRIAXone (ROCEPHIN) 1 g in dextrose 5 % 50 mL IVPB  Status:   Discontinued     1 g 100 mL/hr over 30 Minutes Intravenous Every 24 hours 05/22/14 2300 05/22/14 2339   05/22/14 2315  azithromycin (ZITHROMAX) tablet 500 mg  Status:  Discontinued     500 mg Oral Every 24 hours 05/22/14 2300 05/22/14 2340   05/22/14 2200  cefTRIAXone (ROCEPHIN) 1 g in dextrose 5 % 50 mL IVPB  Status:  Discontinued     1 g 100 mL/hr over 30 Minutes Intravenous Every 24 hours 05/22/14 2146 05/22/14 2300   05/22/14 2200  azithromycin (ZITHROMAX) 500 mg in dextrose 5 % 250 mL IVPB     500 mg 250 mL/hr over 60 Minutes Intravenous  Once 05/22/14 2146 05/22/14 2350         Objective: Filed Weights   05/25/14 0622 05/26/14 0504 05/27/14 0516  Weight: 63.821 kg (140 lb 11.2 oz) 69.174 kg (152 lb 8 oz) 66.2 kg (145 lb 15.1 oz)    Intake/Output Summary (Last 24 hours) at 05/27/14 0945 Last data filed at 05/27/14 0144  Gross per 24 hour  Intake    650 ml  Output    800 ml  Net   -150 ml     Vitals Filed Vitals:   05/26/14 2130 05/27/14 0129 05/27/14 0516 05/27/14 0741  BP:   99/53   Pulse:   78   Temp:   98.4 F (  36.9 C)   TempSrc:   Oral   Resp:   18   Height:      Weight:   66.2 kg (145 lb 15.1 oz)   SpO2: 95% 95% 95% 99%    Exam: General: No acute respiratory distress Lungs: wheeze and ronchi bilaterally appears to have improved.  Cardiovascular: Regular rate and rhythm without murmur gallop or rub normal S1 and S2 Abdomen: Nontender, nondistended, soft, bowel sounds positive, no rebound, no ascites, no appreciable mass Extremities: No significant cyanosis, clubbing, or edema bilateral lower extremities  Data Reviewed: Basic Metabolic Panel:  Recent Labs Lab 05/22/14 1825 05/23/14 0045 05/25/14 0434  NA 137 137 142  K 3.6* 3.6* 4.5  CL 96 97 106  CO2 32 31 27  GLUCOSE 99 138* 104*  BUN CREATININE 0.93 0.82 0.80  CALCIUM 9.1 8.7 8.4   Liver Function Tests:  Recent Labs Lab 05/22/14 1825 05/23/14 0045  AST 17 17  ALT 8 8   ALKPHOS 95 88  BILITOT 0.9 0.8  PROT 8.0 7.4  ALBUMIN 3.7 3.4*   No results found for this basename: LIPASE, AMYLASE,  in the last 168 hours No results found for this basename: AMMONIA,  in the last 168 hours CBC:  Recent Labs Lab 05/23/14 0045 05/23/14 1131 05/23/14 2332 05/24/14 1045 05/25/14 0434  WBC 4.8 5.4 6.2 5.2 6.2  HGB 9.4* 9.2* 8.7* 8.4* 8.3*  HCT 28.8* 28.9* 27.3* 27.2* 26.5*  MCV 99.3 99.3 99.3 101.5* 101.1*  PLT 210 223 197 186 177   Cardiac Enzymes:  Recent Labs Lab 05/23/14 0045 05/23/14 0419 05/23/14 1131  TROPONINI <0.30 <0.30 <0.30   BNP (last 3 results) No results found for this basename: PROBNP,  in the last 8760 hours CBG: No results found for this basename: GLUCAP,  in the last 168 hours  Recent Results (from the past 240 hour(s))  MRSA PCR SCREENING     Status: None   Collection Time    05/22/14 11:59 PM      Result Value Ref Range Status   MRSA by PCR NEGATIVE  NEGATIVE Final   Comment:            The GeneXpert MRSA Assay (FDA     approved for NASAL specimens     only), is one component of a     comprehensive MRSA colonization     surveillance program. It is not     intended to diagnose MRSA     infection nor to guide or     monitor treatment for     MRSA infections.  CULTURE, BLOOD (ROUTINE X 2)     Status: None   Collection Time    05/23/14 12:45 AM      Result Value Ref Range Status   Specimen Description BLOOD LEFT ARM   Final   Special Requests BOTTLES DRAWN AEROBIC ONLY 5CC   Final   Culture  Setup Time     Final   Value: 05/23/2014 08:36     Performed at [facility]   Culture     Final   Value:        BLOOD CULTURE RECEIVED NO GROWTH TO DATE CULTURE WILL BE HELD FOR 5 DAYS BEFORE ISSUING A FINAL NEGATIVE REPORT     Performed at [facility]   Report Status PENDING   Incomplete  CULTURE, BLOOD (ROUTINE X 2)     Status: None   Collection Time  05/23/14 12:55 AM      Result Value Ref Range Status    Specimen Description BLOOD LEFT HAND   Final   Special Requests BOTTLES DRAWN AEROBIC ONLY 10CC   Final   Culture  Setup Time     Final   Value: 05/23/2014 08:36     Performed at [facility]   Culture     Final   Value:        BLOOD CULTURE RECEIVED NO GROWTH TO DATE CULTURE WILL BE HELD FOR 5 DAYS BEFORE ISSUING A FINAL NEGATIVE REPORT     Performed at [facility]   Report Status PENDING   Incomplete  CULTURE, EXPECTORATED SPUTUM-ASSESSMENT     Status: None   Collection Time    05/25/14  3:32 PM      Result Value Ref Range Status   Specimen Description SPUTUM   Final   Special Requests NONE   Final   Sputum evaluation     Final   Value: THIS SPECIMEN IS ACCEPTABLE. RESPIRATORY CULTURE REPORT TO FOLLOW.   Report Status 05/25/2014 FINAL   Final  CULTURE, RESPIRATORY (NON-EXPECTORATED)     Status: None   Collection Time    05/25/14  3:32 PM      Result Value Ref Range Status   Specimen Description SPUTUM   Final   Special Requests NONE   Final   Gram Stain     Final   Value: FEW WBC PRESENT, PREDOMINANTLY MONONUCLEAR     RARE SQUAMOUS EPITHELIAL CELLS PRESENT     NO ORGANISMS SEEN     Performed at [facility]   Culture     Final   Value: Culture reincubated for better growth     Performed at [facility]   Report Status PENDING   Incomplete     Studies:  Recent x-ray studies have been reviewed in detail by the Attending Physician  Scheduled Meds:  Scheduled Meds: . azithromycin  500 mg Oral Q24H  . calcium-vitamin D  1 tablet Oral Q breakfast  . carvedilol  3.125 mg Oral BID WC  . cefTRIAXone (ROCEPHIN)  IV  1 g Intravenous Q24H  . clopidogrel  75 mg Oral Daily  . digoxin  0.125 mg Oral Daily  . feeding supplement (ENSURE COMPLETE)  237 mL Oral BID BM  . fentaNYL  25 mcg Transdermal Q72H  . gabapentin  600 mg Oral BID  . guaiFENesin  600 mg Oral BID  . ipratropium-albuterol  3 mL Nebulization Q4H  . isosorbide mononitrate  60 mg  Oral Daily  . levothyroxine  88 mcg Oral QAC breakfast  . lubiprostone  24 mcg Oral BID WC  . mometasone-formoterol  2 puff Inhalation BID  . niacin  500 mg Oral QHS  . saccharomyces boulardii  250 mg Oral BID  . simvastatin  10 mg Oral q1800  . warfarin  3 mg Oral Once per day on Sun Mon Wed Thu Fri Sat   And  . [START ON 05/29/2014] warfarin  4.5 mg Oral Once per day on Tue  . Warfarin - Pharmacist Dosing Inpatient   Does not apply q1800   Continuous Infusions:    Time spent on care of this patient: 35 min   Daren Yeagle, MD 05/27/2014, 9:45 AM  LOS: 5 days   [facility] Office  [phone] Pager - Text Page per [email or web address]  If 7PM-7AM, please contact night-coverage [email or web address]

## 2014-05-28 DIAGNOSIS — I369 Nonrheumatic tricuspid valve disorder, unspecified: Secondary | ICD-10-CM

## 2014-05-28 LAB — PROTIME-INR
INR: 1.83 — ABNORMAL HIGH (ref 0.00–1.49)
Prothrombin Time: 21.2 seconds — ABNORMAL HIGH (ref 11.6–15.2)

## 2014-05-28 MED ORDER — ENOXAPARIN SODIUM 80 MG/0.8ML ~~LOC~~ SOLN
65.0000 mg | Freq: Two times a day (BID) | SUBCUTANEOUS | Status: DC
  Administered 2014-05-28 (×2): 65 mg via SUBCUTANEOUS
  Filled 2014-05-28 (×4): qty 0.8

## 2014-05-28 MED ORDER — WARFARIN SODIUM 5 MG PO TABS
5.0000 mg | ORAL_TABLET | Freq: Once | ORAL | Status: AC
  Administered 2014-05-28: 5 mg via ORAL
  Filled 2014-05-28: qty 1

## 2014-05-28 MED ORDER — IPRATROPIUM-ALBUTEROL 0.5-2.5 (3) MG/3ML IN SOLN
3.0000 mL | Freq: Four times a day (QID) | RESPIRATORY_TRACT | Status: DC
  Administered 2014-05-28 – 2014-06-04 (×28): 3 mL via RESPIRATORY_TRACT
  Filled 2014-05-28 (×30): qty 3

## 2014-05-28 NOTE — Clinical Documentation Improvement (Signed)
Possible Clinical Conditions?  Acute Respiratory Failure Acute on Chronic Respiratory Failure  Acute Respiratory Insufficiency  Cannot Clinically Determine   Supporting Information: Risk Factors: PNA,  COPD,  A Fib Signs & Symptoms: note 05/26/14: " cont O2- chronically on 2.5 L at home - Nebs routinely Q 4 hrs and PRN"hypoxic with pulse ox in mid 80s on 2 L -  Treatment: Nebs, O2 supplement @ 3L/[place]  greater than baseline at home   Thank You, Andy Gauss ,RN Clinical Documentation Specialist:  [phone]  [facility]- Health Information Management

## 2014-05-28 NOTE — Progress Notes (Signed)
[facility] Progress Note   Travis Bond ZOX:[id number] DOB: Sep 14, 1926 DOA: 05/22/2014 PCP: No primary provider on file.  Brief narrative: Travis Bond is a 80 y.o. male admitted with pneumonia with symptoms of hemoptysis. He was recently treated for CAP 4 wks ago with 7 days. He developed recurrent symptoms about 2 wks ago and now is coughing up blood as well. 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 He is on 2-3 L of O 2 at home for COPD.    Subjective: States he is breathing well today.   Assessment/Plan: Principal Problem:   Community acquired pneumonia with severe underlying COPD - second episode- - urine strep antigen negative - Rocephin and Zithromax -Keep sats greater than 90%.-  - check pulse ox with ambulation today - plan for SNF when medically stable.   Active Problems:   C O P D - cont O2- chronically on 2.5 L at home - Nebs routinely Q 4 hrs and PRN    Atrial fibrillation - cont Dig, Coreg and Coumadin. INR now subtherapeutic- management per pharmacy- will place on full dose Lovenox until therapeutic again.  - repeat ECHO- severe MS noted on last ECHO in 2012.      Drug eluting stent - CAD - Cont Plavix  Nose bleed - on 9/26- resolved  AOCD - Hb stable  Code Status: Full code Family Communication: none at bedside.  Disposition Plan: SNF when stable.  DVT prophylaxis: Coumadin  Consultants: cardiology Procedures: none  Antibiotics: Anti-infectives   Start     Dose/Rate Route Frequency Ordered Stop   05/23/14 2200  azithromycin (ZITHROMAX) tablet 500 mg     500 mg Oral Every 24 hours 05/22/14 2341 05/30/14 2159   05/23/14 2200  cefTRIAXone (ROCEPHIN) 1 g in dextrose 5 % 50 mL IVPB     1 g 100 mL/hr over 30 Minutes Intravenous Every 24 hours 05/22/14 2341 05/30/14 2159   05/23/14 0000  cefTRIAXone (ROCEPHIN) 1 g in dextrose 5 % 50 mL IVPB  Status:  Discontinued     1 g 100 mL/hr  over 30 Minutes Intravenous Every 24 hours 05/22/14 2340 05/22/14 2341   05/22/14 2315  cefTRIAXone (ROCEPHIN) 1 g in dextrose 5 % 50 mL IVPB  Status:  Discontinued     1 g 100 mL/hr over 30 Minutes Intravenous Every 24 hours 05/22/14 2300 05/22/14 2339   05/22/14 2315  azithromycin (ZITHROMAX) tablet 500 mg  Status:  Discontinued     500 mg Oral Every 24 hours 05/22/14 2300 05/22/14 2340   05/22/14 2200  cefTRIAXone (ROCEPHIN) 1 g in dextrose 5 % 50 mL IVPB  Status:  Discontinued     1 g 100 mL/hr over 30 Minutes Intravenous Every 24 hours 05/22/14 2146 05/22/14 2300   05/22/14 2200  azithromycin (ZITHROMAX) 500 mg in dextrose 5 % 250 mL IVPB     500 mg 250 mL/hr over 60 Minutes Intravenous  Once 05/22/14 2146 05/22/14 2350         Objective: Filed Weights   05/26/14 0504 05/27/14 0516 05/28/14 0404  Weight: 69.174 kg (152 lb 8 oz) 66.2 kg (145 lb 15.1 oz) 66 kg (145 lb 8.1 oz)    Intake/Output Summary (Last 24 hours) at 05/28/14 0912 Last data filed at 05/28/14 0410  Gross per 24 hour  Intake    200 ml  Output    801 ml  Net   -601 ml  Vitals Filed Vitals:   05/27/14 2344 05/28/14 0334 05/28/14 0404 05/28/14 0905  BP:   130/70   Pulse:   79   Temp:   98.9 F (37.2 C)   TempSrc:   Oral   Resp:   18   Height:      Weight:   66 kg (145 lb 8.1 oz)   SpO2: 95% 96% 96% 94%    Exam: General: No acute respiratory distress Lungs: wheeze and ronchi bilaterally appears to have improved.  Cardiovascular: Regular rate and rhythm without murmur gallop or rub normal S1 and S2 Abdomen: Nontender, nondistended, soft, bowel sounds positive, no rebound, no ascites, no appreciable mass Extremities: No significant cyanosis, clubbing, or edema bilateral lower extremities  Data Reviewed: Basic Metabolic Panel:  Recent Labs Lab 05/22/14 1825 05/23/14 0045 05/25/14 0434  NA 137 137 142  K 3.6* 3.6* 4.5  CL 96 97 106  CO2 32 31 27  GLUCOSE 99 138* 104*  BUN CREATININE 0.93 0.82 0.80  CALCIUM 9.1 8.7 8.4   Liver Function Tests:  Recent Labs Lab 05/22/14 1825 05/23/14 0045  AST 17 17  ALT 8 8  ALKPHOS 95 88  BILITOT 0.9 0.8  PROT 8.0 7.4  ALBUMIN 3.7 3.4*   No results found for this basename: LIPASE, AMYLASE,  in the last 168 hours No results found for this basename: AMMONIA,  in the last 168 hours CBC:  Recent Labs Lab 05/23/14 0045 05/23/14 1131 05/23/14 2332 05/24/14 1045 05/25/14 0434  WBC 4.8 5.4 6.2 5.2 6.2  HGB 9.4* 9.2* 8.7* 8.4* 8.3*  HCT 28.8* 28.9* 27.3* 27.2* 26.5*  MCV 99.3 99.3 99.3 101.5* 101.1*  PLT 210 223 197 186 177   Cardiac Enzymes:  Recent Labs Lab 05/23/14 0045 05/23/14 0419 05/23/14 1131  TROPONINI <0.30 <0.30 <0.30   BNP (last 3 results) No results found for this basename: PROBNP,  in the last 8760 hours CBG: No results found for this basename: GLUCAP,  in the last 168 hours  Recent Results (from the past 240 hour(s))  MRSA PCR SCREENING     Status: None   Collection Time    05/22/14 11:59 PM      Result Value Ref Range Status   MRSA by PCR NEGATIVE  NEGATIVE Final   Comment:            The GeneXpert MRSA Assay (FDA     approved for NASAL specimens     only), is one component of a     comprehensive MRSA colonization     surveillance program. It is not     intended to diagnose MRSA     infection nor to guide or     monitor treatment for     MRSA infections.  CULTURE, BLOOD (ROUTINE X 2)     Status: None   Collection Time    05/23/14 12:45 AM      Result Value Ref Range Status   Specimen Description BLOOD LEFT ARM   Final   Special Requests BOTTLES DRAWN AEROBIC ONLY 5CC   Final   Culture  Setup Time     Final   Value: 05/23/2014 08:36     Performed at [facility]   Culture     Final   Value:        BLOOD CULTURE RECEIVED NO GROWTH TO DATE CULTURE WILL BE HELD FOR 5 DAYS BEFORE ISSUING A FINAL NEGATIVE REPORT  Performed at [facility]   Report Status  PENDING   Incomplete  CULTURE, BLOOD (ROUTINE X 2)     Status: None   Collection Time    05/23/14 12:55 AM      Result Value Ref Range Status   Specimen Description BLOOD LEFT HAND   Final   Special Requests BOTTLES DRAWN AEROBIC ONLY 10CC   Final   Culture  Setup Time     Final   Value: 05/23/2014 08:36     Performed at [facility]   Culture     Final   Value:        BLOOD CULTURE RECEIVED NO GROWTH TO DATE CULTURE WILL BE HELD FOR 5 DAYS BEFORE ISSUING A FINAL NEGATIVE REPORT     Performed at [facility]   Report Status PENDING   Incomplete  CULTURE, EXPECTORATED SPUTUM-ASSESSMENT     Status: None   Collection Time    05/25/14  3:32 PM      Result Value Ref Range Status   Specimen Description SPUTUM   Final   Special Requests NONE   Final   Sputum evaluation     Final   Value: THIS SPECIMEN IS ACCEPTABLE. RESPIRATORY CULTURE REPORT TO FOLLOW.   Report Status 05/25/2014 FINAL   Final  CULTURE, RESPIRATORY (NON-EXPECTORATED)     Status: None   Collection Time    05/25/14  3:32 PM      Result Value Ref Range Status   Specimen Description SPUTUM   Final   Special Requests NONE   Final   Gram Stain     Final   Value: FEW WBC PRESENT, PREDOMINANTLY MONONUCLEAR     RARE SQUAMOUS EPITHELIAL CELLS PRESENT     NO ORGANISMS SEEN     Performed at [facility]   Culture     Final   Value: NORMAL OROPHARYNGEAL FLORA     Performed at [facility]   Report Status 05/27/2014 FINAL   Final     Studies:  Recent x-ray studies have been reviewed in detail by the Attending Physician  Scheduled Meds:  Scheduled Meds: . azithromycin  500 mg Oral Q24H  . calcium-vitamin D  1 tablet Oral Q breakfast  . carvedilol  3.125 mg Oral BID WC  . cefTRIAXone (ROCEPHIN)  IV  1 g Intravenous Q24H  . clopidogrel  75 mg Oral Daily  . digoxin  0.125 mg Oral Daily  . feeding supplement (ENSURE COMPLETE)  237 mL Oral BID BM  . fentaNYL  25 mcg Transdermal Q72H  .  gabapentin  600 mg Oral BID  . guaiFENesin  600 mg Oral BID  . ipratropium-albuterol  3 mL Nebulization QID  . isosorbide mononitrate  60 mg Oral Daily  . levothyroxine  88 mcg Oral QAC breakfast  . lubiprostone  24 mcg Oral BID WC  . mometasone-formoterol  2 puff Inhalation BID  . niacin  500 mg Oral QHS  . saccharomyces boulardii  250 mg Oral BID  . simvastatin  10 mg Oral q1800  . warfarin  3 mg Oral Once per day on Sun Mon Wed Thu Fri Sat   And  . [START ON 05/29/2014] warfarin  4.5 mg Oral Once per day on Tue  . Warfarin - Pharmacist Dosing Inpatient   Does not apply q1800   Continuous Infusions:    Time spent on care of this patient: 35 min   Alquan Morrish, MD 05/28/2014, 9:12 AM  LOS: 6 days   [facility] Office  [phone] Pager - Text Page per [email or web address]  If 7PM-7AM, please contact night-coverage [email or web address]

## 2014-05-28 NOTE — Progress Notes (Signed)
Physical Therapy Treatment Patient Details Name: Travis Bond MRN: [id number] DOB: 1926/11/16 Today's Date: 05/28/2014    History of Present Illness Travis Bond is a 80 y.o. male with PMH of CKD-III, COPD, A. fib on coumadin, coronary artery disease (post status of stent placement 5 years ago), GERD, aortic stenosis, who presents with shortness of breath and hemoptysis.Patient reports that he was treated for pneumonia as outpatient 4 weeks ago. CTA showed no evidence of pulmonary embolism, but has reticular nodular infiltrate which suggests pneumonia per radiologist. Hgb 9.3 (from 11.1 in 6/15)    PT Comments    Focus of session was therapeutic exercise. Pt on 3L/min supplemental O2 throughout session. He required many rest breaks where he focused on his breathing techniques, as he became SOB with minimal activity. Was able to tolerate seated exercise with O2 sats decreasing to 87%, and standing exercise with decrease to 84%. Will continue to follow and progress as able per POC.   Follow Up Recommendations  SNF;Supervision/Assistance - 24 hour     Equipment Recommendations   (Rollator if home)    Recommendations for Other Services       Precautions / Restrictions Precautions Precautions: Fall Precaution Comments: watch 02 sats Restrictions Weight Bearing Restrictions: No    Mobility  Bed Mobility               General bed mobility comments: Pt was sitting in recliner chair upon PT arrival.   Transfers Overall transfer level: Needs assistance Equipment used: Rolling walker (2 wheeled) Transfers: Sit to/from [employer] Sit to Stand: Min guard Stand pivot transfers: Min guard       General transfer comment: Assist for mnagement of lines. Pt demonstrated proper hand placement to stand, however when fatigued, leans on the front of the walker. VC's for general safety awareness. SPT to [facility].  Ambulation/Gait             General Gait  Details: Deferred as pt was very fatigued and needed to use BSC after therapeutic exercise.    Stairs            Wheelchair Mobility    Modified Rankin (Stroke Patients Only)       Balance Overall balance assessment: Needs assistance Sitting-balance support: Feet supported;No upper extremity supported Sitting balance-Leahy Scale: Good     Standing balance support: Bilateral upper extremity supported;During functional activity Standing balance-Leahy Scale: Poor Standing balance comment: Pt requires UE assist to maintain balance while standing.                     Cognition Arousal/Alertness: Awake/alert Behavior During Therapy: WFL for tasks assessed/performed;Flat affect Overall Cognitive Status: Within Functional Limits for tasks assessed                      Exercises General Exercises - Lower Extremity Long Arc Quad: 10 reps Hip ABduction/ADduction: 10 reps (isometric hip adduction with pillow) Hip Flexion/Marching: 10 reps;Standing    General Comments        Pertinent Vitals/Pain Pain Assessment: No/denies pain    Home Living                      Prior Function            PT Goals (current goals can now be found in the care plan section) Acute Rehab PT Goals Patient Stated Goal: Walk more PT Goal Formulation: With patient Time For Goal Achievement: 06/06/14  Potential to Achieve Goals: Fair Progress towards PT goals: Progressing toward goals    Frequency  Min 2X/week    PT Plan Current plan remains appropriate    Co-evaluation             End of Session Equipment Utilized During Treatment: Oxygen Activity Tolerance: Patient limited by fatigue Patient left: with call bell/phone within reach;Other (comment) (Sitting on [facility] - RN aware. )     Time: 4098-1191 PT Time Calculation (min): 19 min  Charges:  $Therapeutic Activity: 8-22 mins                    G Codes:      Ruthann Cancer 06/19/14, 4:52  PM  Ruthann Cancer, PT, DPT Acute Rehabilitation Services Pager: [phone]

## 2014-05-28 NOTE — Progress Notes (Signed)
  Echocardiogram 2D Echocardiogram has been performed.  Kaytlen Lightsey FRANCES 05/28/2014, 11:08 AM

## 2014-05-28 NOTE — Progress Notes (Signed)
ANTICOAGULATION CONSULT NOTE - Follow Up Consult  Pharmacy Consult for coumadin and lovenox Indication: atrial fibrillation  No Known Allergies  Patient Measurements: Height: 5' 10.5" (179.1 cm) Weight: 145 lb 8.1 oz (66 kg) (C Scale) IBW/kg (Calculated) : 74.15 Heparin Dosing Weight:   Vital Signs: Temp: 98.9 F (37.2 C) (09/28 0404) Temp src: Oral (09/28 0404) BP: 130/70 mmHg (09/28 0404) Pulse Rate: 79 (09/28 0404)  Labs:  Recent Labs  05/26/14 0515 05/27/14 0415 05/28/14 0401  LABPROT 26.8* 24.1* 21.2*  INR 2.48* 2.16* 1.83*    Estimated Creatinine Clearance: 64.2 ml/min (by C-G formula based on Cr of 0.8).   Medications:  Scheduled:  . azithromycin  500 mg Oral Q24H  . calcium-vitamin D  1 tablet Oral Q breakfast  . carvedilol  3.125 mg Oral BID WC  . cefTRIAXone (ROCEPHIN)  IV  1 g Intravenous Q24H  . clopidogrel  75 mg Oral Daily  . digoxin  0.125 mg Oral Daily  . feeding supplement (ENSURE COMPLETE)  237 mL Oral BID BM  . fentaNYL  25 mcg Transdermal Q72H  . gabapentin  600 mg Oral BID  . guaiFENesin  600 mg Oral BID  . ipratropium-albuterol  3 mL Nebulization QID  . isosorbide mononitrate  60 mg Oral Daily  . levothyroxine  88 mcg Oral QAC breakfast  . lubiprostone  24 mcg Oral BID WC  . mometasone-formoterol  2 puff Inhalation BID  . niacin  500 mg Oral QHS  . saccharomyces boulardii  250 mg Oral BID  . simvastatin  10 mg Oral q1800  . warfarin  3 mg Oral Once per day on Sun Mon Wed Thu Fri Sat   And  . [START ON 05/29/2014] warfarin  4.5 mg Oral Once per day on Tue  . Warfarin - Pharmacist Dosing Inpatient   Does not apply q1800   Infusions:    Assessment: INR decreased to 1.83 today despite continued coumadin for h/o afib in this 80 yo male. Pharmacy consulted to start lovenox until INR therapeutic.  INR has been therapeutic since admission 9/23 on his usual/home dosage of coumadin 3 mg daily except 4.5mg  qTues. Also on azithromycin day # 6/7  9/23 which can increase coumadin effect thus I am uncertain why INR has decreased.   Lovenox:  Wt= 66 kg, SCr =  CrCl ~ 64 ml/min On 9/25 Pltc  177K, H/H 8.3/26.5 No bleeding noted.  Goal of Therapy:  INR 2-3 Monitor platelets by anticoagulation protocol: Yes   Plan:  Begin Lovenox 65 mg SQ q12h until INR =/>2.0 Coumadin 5 mg today x1 INR daily CBC in AM   Noah Delaine, RPh Clinical Pharmacist Pager: [phone] 05/28/2014,9:34 AM

## 2014-05-29 ENCOUNTER — Inpatient Hospital Stay (HOSPITAL_COMMUNITY): Payer: Medicare Other

## 2014-05-29 DIAGNOSIS — J9610 Chronic respiratory failure, unspecified whether with hypoxia or hypercapnia: Secondary | ICD-10-CM

## 2014-05-29 DIAGNOSIS — R0902 Hypoxemia: Secondary | ICD-10-CM

## 2014-05-29 DIAGNOSIS — J441 Chronic obstructive pulmonary disease with (acute) exacerbation: Secondary | ICD-10-CM

## 2014-05-29 DIAGNOSIS — R042 Hemoptysis: Secondary | ICD-10-CM

## 2014-05-29 DIAGNOSIS — J439 Emphysema, unspecified: Secondary | ICD-10-CM

## 2014-05-29 DIAGNOSIS — J189 Pneumonia, unspecified organism: Secondary | ICD-10-CM

## 2014-05-29 DIAGNOSIS — J438 Other emphysema: Secondary | ICD-10-CM

## 2014-05-29 LAB — CULTURE, BLOOD (ROUTINE X 2)
CULTURE: NO GROWTH
Culture: NO GROWTH

## 2014-05-29 LAB — CBC
HCT: 25.1 % — ABNORMAL LOW (ref 39.0–52.0)
HCT: 26.1 % — ABNORMAL LOW (ref 39.0–52.0)
HEMOGLOBIN: 7.9 g/dL — AB (ref 13.0–17.0)
Hemoglobin: 8.2 g/dL — ABNORMAL LOW (ref 13.0–17.0)
MCH: 31.6 pg (ref 26.0–34.0)
MCH: 32.7 pg (ref 26.0–34.0)
MCHC: 31.5 g/dL (ref 30.0–36.0)
MCHC: 31.4 g/dL (ref 30.0–36.0)
MCV: 100.4 fL — ABNORMAL HIGH (ref 78.0–100.0)
MCV: 104 fL — ABNORMAL HIGH (ref 78.0–100.0)
Platelets: 174 10*3/uL (ref 150–400)
Platelets: 181 10*3/uL (ref 150–400)
RBC: 2.5 MIL/uL — ABNORMAL LOW (ref 4.22–5.81)
RBC: 2.51 MIL/uL — ABNORMAL LOW (ref 4.22–5.81)
RDW: 17.6 % — AB (ref 11.5–15.5)
RDW: 17.7 % — AB (ref 11.5–15.5)
WBC: 4.4 10*3/uL (ref 4.0–10.5)
WBC: 5.8 10*3/uL (ref 4.0–10.5)

## 2014-05-29 LAB — PROTIME-INR
INR: 1.91 — AB (ref 0.00–1.49)
PROTHROMBIN TIME: 21.9 s — AB (ref 11.6–15.2)

## 2014-05-29 LAB — PROCALCITONIN: Procalcitonin: <0.1 ng/mL

## 2014-05-29 LAB — VITAMIN B12: VITAMIN B 12: 213 pg/mL (ref 211–911)

## 2014-05-29 LAB — PRO B NATRIURETIC PEPTIDE: Pro B Natriuretic peptide (BNP): 1155 pg/mL — ABNORMAL HIGH (ref 0–450)

## 2014-05-29 MED ORDER — WARFARIN SODIUM 3 MG PO TABS
3.0000 mg | ORAL_TABLET | Freq: Once | ORAL | Status: DC
  Filled 2014-05-29: qty 1

## 2014-05-29 MED ORDER — METHYLPREDNISOLONE SODIUM SUCC 40 MG IJ SOLR
40.0000 mg | Freq: Four times a day (QID) | INTRAMUSCULAR | Status: DC
  Administered 2014-05-29 – 2014-05-31 (×8): 40 mg via INTRAVENOUS
  Filled 2014-05-29 (×12): qty 1

## 2014-05-29 NOTE — Progress Notes (Signed)
ANTICOAGULATION CONSULT NOTE - Follow Up Consult  Pharmacy Consult for coumadin and lovenox Indication: atrial fibrillation  No Known Allergies  Patient Measurements: Height: 5' 10.5" (179.1 cm) Weight: 145 lb 4.1 oz (65.887 kg) (Scale B) IBW/kg (Calculated) : 74.15 Heparin Dosing Weight:   Vital Signs: Temp: 98.6 F (37 C) (09/29 0700) Temp src: Oral (09/29 0700) BP: 152/68 mmHg (09/29 0700) Pulse Rate: 72 (09/29 0700)  Labs:  Recent Labs  05/27/14 0415 05/28/14 0401 05/29/14 0440  HGB  --   --  7.9*  HCT  --   --  25.1*  PLT  --   --  174  LABPROT 24.1* 21.2* 21.9*  INR 2.16* 1.83* 1.91*    Estimated Creatinine Clearance: 64.1 ml/min (by C-G formula based on Cr of 0.8).   Medications:  Scheduled:  . azithromycin  500 mg Oral Q24H  . calcium-vitamin D  1 tablet Oral Q breakfast  . carvedilol  3.125 mg Oral BID WC  . cefTRIAXone (ROCEPHIN)  IV  1 g Intravenous Q24H  . digoxin  0.125 mg Oral Daily  . feeding supplement (ENSURE COMPLETE)  237 mL Oral BID BM  . fentaNYL  25 mcg Transdermal Q72H  . gabapentin  600 mg Oral BID  . guaiFENesin  600 mg Oral BID  . ipratropium-albuterol  3 mL Nebulization QID  . isosorbide mononitrate  60 mg Oral Daily  . levothyroxine  88 mcg Oral QAC breakfast  . lubiprostone  24 mcg Oral BID WC  . mometasone-formoterol  2 puff Inhalation BID  . niacin  500 mg Oral QHS  . saccharomyces boulardii  250 mg Oral BID  . simvastatin  10 mg Oral q1800  . Warfarin - Pharmacist Dosing Inpatient   Does not apply q1800   Infusions:    Assessment: INR slight increase to 1.91 today after dropped to 1.83 yesterday. Coumadin has continued for h/o afib in this 80 yo male.  Lovenox 65 mg SQ q12h started yesterday 05/28/14 due to INR subtherapeutic. His PTA dose of coumadin is 3 mg daily except 4.5 mg qTues. Also on azithromycin day # 7/7  which can increase coumadin effect thus I am uncertain why INR has decreased. Today should be last day of  azithromycin.  Lovenox:  Wt= 66 kg, SCr = 0.8 CrCl ~ 64 ml/min Today Pltc stable 174K, Hgb 7.9 < 8.3 Nose bleed - on 9/26- resolved MD notes today that patient continues to cough up blood daily- no sputum is mixed with blood that MD can see in the kidney tray per Dr. Karlyne Greenspanizwan's note. Dr. Butler Denmarkizwan plans to hold both Plavix and Lovenox today. MD hs asked pulm to evaluate him today as hemoptysis is not resolving with antibiotic treatment.  - plan for SNF when medically stable. Dr. Butler Denmarkizwan is continuing the coumadin.    Goal of Therapy:  INR 2-3 Monitor platelets by anticoagulation protocol: Yes   Plan:  MD holding lovenox and plavix today Give Coumadin 3 mg today x1 INR daily   Noah Delaineuth Graceanna Theissen, RPh Clinical Pharmacist Pager: [phone] 05/29/2014,11:12 AM

## 2014-05-29 NOTE — Consult Note (Addendum)
Name: Travis Bond MRN: [id number] DOB: [birth year]    ADMISSION DATE:  05/22/2014 CONSULTATION DATE:  05/29/2014 LOS 7 days   REFERRING MD :  Dr. Butler Denmark  CHIEF COMPLAINT:  SOB, hemoptysis  BRIEF PATIENT DESCRIPTION: 80 year old male with severe COPD on home O2 (VS pt) presented 9/22 to [facility] ED for increasing SOB and hemoptysis. He was admitted and treated for CAP. His symptoms have been slow to improve and he is still having daily hemoptysis. PCCM asked to see for further evaluation.   SIGNIFICANT EVENTS  9/22 admitted for suspected CAP with hemoptysis 9/29 PCCM consult due to slow response to therapy  STUDIES:  2011 Spirometry > FEV1 1.20(47%), FVC 2.56(64%), FEV1% 46 9/22 CTA chest - No PE, c/w COPD and fibrosis. Nodular opacities c/w PNA.  Infrarenal aortic aneurysm, Numerous renal lesions grossly unchanged from prior study. 9/28 Echo > 45-50%, grade 2 DD  HISTORY OF PRESENT ILLNESS:  80 year old with PMH as described below, which includeschronic respiratory failure with severe COPD on home O2 (followed by VSpirometry 12/05/09 >> FEV1 1.20(47%), FVC 2.56(64%), FEV1% 46),   pulmonary fibrosis mentioned in chart but not clearly demonstrated on CT abdomen lung cut 11/14/12 and lung ct 2011). Clearly emphysema/copd major component. Also, has CAD, Afib, Hypothyroid, CKD, and infrarenal aortic aneurysm.  Symptom decline   started about one month ago. At that time he was started on a course of antibiotics with little benefit. 2 weeks prior to admission he additionally developed hemoptysis. Symptoms continued to worsen. He called to pulmonary office 9/18 and was directed to come to ED. 9/22 he presented to ED with these complaints, plus pleuritic chest pain. He was noted to be afebrile and without leukocytosis. He was started on CAP coverage and admitted under hospitalist service.  He was ruled out for MI. He does take coumadin for afib, which was therapeutic at time of presentation. This was  continued throughout his hospitalization. Dr. Donnie Aho was asked about this and thought that holding it for a few days would be fine if that is what was needed to stop hemoptysis. He also developed nose bleeds during this admission. 9/29 he has had only minimal improvements to his dyspnea and no real improvement to hemoptysis per patient. PCCM asked to see in consultation.   PAST MEDICAL HISTORY :   has a past medical history of COPD (chronic obstructive pulmonary disease); Chronic respiratory failure with hypoxia; Pulmonary fibrosis; Coronary artery disease; Hypertension; Hyperlipidemia; Carotid stenosis; Peripheral vascular disease; Anemia; Peripheral neuropathy; B12 deficiency; Pneumonia; Atrial fibrillation; Hypothyroidism; Anxiety state, unspecified; Unspecified deficiency anemia; Abdominal aneurysm without mention of rupture; Aortic valve disorders; Type II or unspecified type diabetes mellitus without mention of complication, not stated as uncontrolled; Unspecified hereditary and idiopathic peripheral neuropathy; Microscopic hematuria; and Chronic kidney disease.   has past surgical history that includes Inguinal hernia repair (1994); Cardioversion (09/21/2011); cardiac stent (2010 or 2011 pt not sure); and Hernia repair (1989).  Prior to Admission medications   Medication Sig Start Date End Date Taking? Authorizing Provider  albuterol (PROVENTIL HFA;VENTOLIN HFA) 108 (90 BASE) MCG/ACT inhaler Inhale 2 puffs into the lungs every 6 (six) hours as needed for wheezing or shortness of breath.   Yes Historical Provider, MD  albuterol (PROVENTIL) (2.5 MG/3ML) 0.083% nebulizer solution Take 2.5 mg by nebulization every 6 (six) hours as needed for wheezing or shortness of breath.   Yes Historical Provider, MD  ALPRAZolam Prudy Feeler) 0.25 MG tablet Take 0.25 mg by mouth at  bedtime as needed for anxiety.   Yes Historical Provider, MD  calcium-vitamin D (OSCAL WITH D) 500-200 MG-UNIT per tablet Take 1 tablet by  mouth daily with breakfast.   Yes Historical Provider, MD  carvedilol (COREG) 3.125 MG tablet Take 3.125 mg by mouth 2 (two) times daily with a meal.  10/26/13  Yes Historical Provider, MD  clopidogrel (PLAVIX) 75 MG tablet Take 75 mg by mouth daily.     Yes Historical Provider, MD  digoxin (LANOXIN) 0.125 MG tablet Take 0.125 mg by mouth daily.   Yes Historical Provider, MD  fentaNYL (DURAGESIC - DOSED MCG/HR) 25 MCG/HR patch Place 25 mcg onto the skin every 3 (three) days.   Yes Historical Provider, MD  Fluticasone-Salmeterol (ADVAIR) 250-50 MCG/DOSE AEPB Inhale 1 puff into the lungs 2 (two) times daily.   Yes Historical Provider, MD  furosemide (LASIX) 20 MG tablet Take 20 mg by mouth 2 (two) times daily.   Yes Historical Provider, MD  gabapentin (NEURONTIN) 300 MG capsule Take 600 mg by mouth 2 (two) times daily.   Yes Historical Provider, MD  guaiFENesin (MUCINEX) 600 MG 12 hr tablet Take 600 mg by mouth 2 (two) times daily.   Yes Historical Provider, MD  ipratropium (ATROVENT) 0.02 % nebulizer solution Take 0.5 mg by nebulization 4 (four) times daily.   Yes Historical Provider, MD  isosorbide mononitrate (IMDUR) 60 MG 24 hr tablet Take 60 mg by mouth daily.     Yes Historical Provider, MD  levothyroxine (SYNTHROID, LEVOTHROID) 88 MCG tablet Take 88 mcg by mouth daily before breakfast.   Yes Historical Provider, MD  lubiprostone (AMITIZA) 24 MCG capsule Take 24 mcg by mouth 2 (two) times daily with a meal.   Yes Historical Provider, MD  niacin (NIASPAN) 500 MG CR tablet Take 500 mg by mouth at bedtime.     Yes Historical Provider, MD  nitroGLYCERIN (NITROSTAT) 0.4 MG SL tablet Place 0.4 mg under the tongue every 5 (five) minutes as needed for chest pain.   Yes Historical Provider, MD  pravastatin (PRAVACHOL) 20 MG tablet Take 20 mg by mouth daily.    Yes Historical Provider, MD  saccharomyces boulardii (FLORASTOR) 250 MG capsule Take 250 mg by mouth 2 (two) times daily.   Yes Historical Provider,  MD  warfarin (COUMADIN) 3 MG tablet Take 3-4.5 mg by mouth daily. Take 4.5 on Tuesday ONLY, all other days take 3mg  including Sunday   Yes Historical Provider, MD  moxifloxacin (AVELOX) 400 MG tablet Take 400 mg by mouth daily at 8 pm. x7 days    Historical Provider, MD   No Known Allergies  FAMILY HISTORY:  family history includes Coronary artery disease in his brother; Diabetes in his brother and mother; Heart disease in his brother; Heart failure in his father and mother; Hypertension in his brother. SOCIAL HISTORY:  reports that he quit smoking about 4 years ago. His smoking use included Cigarettes. He has a 65 pack-year smoking history. He has never used smokeless tobacco. He reports that he does not drink alcohol or use illicit drugs.  REVIEW OF SYSTEMS:   Bolds are positive  Constitutional: weight loss, gain, night sweats, Fevers, chills, fatigue .  HEENT: headaches, Sore throat, sneezing, nasal congestion, post nasal drip, Difficulty swallowing, Tooth/dental problems, visual complaints visual changes, ear ache CV:  chest pain, radiates: ,Orthopnea, PND, swelling in lower extremities, dizziness, palpitations, syncope.  GI  heartburn, indigestion, abdominal pain, nausea, vomiting, diarrhea, change in bowel habits, loss of appetite,  bloody stools.  Resp: cough, productive: hemoptysis, dyspnea, chest pain, pleuritic.  Skin: rash or itching or icterus GU: dysuria, change in color of urine, urgency or frequency. flank pain, hematuria  MS: joint pain or swelling. decreased range of motion  Psych: change in mood or affect. depression or anxiety.  Neuro: difficulty with speech, weakness, numbness, ataxia    SUBJECTIVE:   VITAL SIGNS: Temp:  [97.5 F (36.4 C)-98.6 F (37 C)] 98.6 F (37 C) (09/29 0700) Pulse Rate:  [72-83] 72 (09/29 0700) Resp:  [20-22] 20 (09/29 0700) BP: (112-152)/(55-68) 152/68 mmHg (09/29 0700) SpO2:  [95 %-98 %] 96 % (09/29 1201) Weight:  [65.887 kg (145 lb 4.1  oz)] 65.887 kg (145 lb 4.1 oz) (09/29 0700)  PHYSICAL EXAMINATION: General:  Elderly male in NAD on 2 Liters [place], [place], deconditioned (says he rapidly deconditioned in past 10 days) Neuro:  Alert, oriented. HOH, slow to answer questions HEENT:  [place]/AT, PERRL, No JVD noted Cardiovascular:  RRR Lungs:  Respirations even, unlabored. Expiratory rhonchi noted bilaterally. Dark brown sputum production, likely hemoptysis.  Abdomen:  Soft, non-tender, non-distended Musculoskeletal:  NO acute deformity or ROM limitation. Trace BLE edema.  Skin:  Intact   PULMONARY No results found for this basename: PHART, PCO2, PCO2ART, PO2, PO2ART, HCO3, TCO2, O2SAT,  in the last 168 hours  CBC  Recent Labs Lab 05/24/14 1045 05/25/14 0434 05/29/14 0440  HGB 8.4* 8.3* 7.9*  HCT 27.2* 26.5* 25.1*  WBC 5.2 6.2 4.4  PLT 186 177 174    COAGULATION  Recent Labs Lab 05/25/14 0434 05/26/14 0515 05/27/14 0415 05/28/14 0401 05/29/14 0440  INR 2.56* 2.48* 2.16* 1.83* 1.91*    CARDIAC   Recent Labs Lab 05/23/14 0045 05/23/14 0419 05/23/14 1131  TROPONINI <0.30 <0.30 <0.30   No results found for this basename: PROBNP,  in the last 168 hours   CHEMISTRY  Recent Labs Lab 05/22/14 1825 05/23/14 0045 05/25/14 0434  NA 137 137 142  K 3.6* 3.6* 4.5  CL 96 97 106  CO2 32 31 27  GLUCOSE 99 138* 104*  BUN 17 14 14   CREATININE 0.93 0.82 0.80  CALCIUM 9.1 8.7 8.4   Estimated Creatinine Clearance: 64.1 ml/min (by C-G formula based on Cr of 0.8).   LIVER  Recent Labs Lab 05/22/14 1825 05/23/14 0045  05/25/14 0434 05/26/14 0515 05/27/14 0415 05/28/14 0401 05/29/14 0440  AST 17 17  --   --   --   --   --   --   ALT 8 8  --   --   --   --   --   --   ALKPHOS 95 88  --   --   --   --   --   --   BILITOT 0.9 0.8  --   --   --   --   --   --   PROT 8.0 7.4  --   --   --   --   --   --   ALBUMIN 3.7 3.4*  --   --   --   --   --   --   INR 2.64*  --   < > 2.56* 2.48* 2.16* 1.83* 1.91*    < > = values in this interval not displayed.   INFECTIOUS No results found for this basename: LATICACIDVEN, PROCALCITON,  in the last 168 hours   ENDOCRINE CBG (last 3)  No results found for this basename: GLUCAP,  in the last 72 hours       IMAGING x48h No results found.     ASSESSMENT / PLAN: BAseline: Severe CODP with baseline chronic respiratoyr faiure on home O2 +/- exacerbation   - Currently Possible CAP (PE ruled out)- viral v bacterial- was afebrile with no leukocytosis on presentation - Ongoing hemoptysis - new from admission   -  CT  At admit 05/22/2014 - shows findings of ILD at base but these seem new compaed to 2014 Abd CT lung cut and 2011 CT chest. I supsect these are infectious findings and can explain cause for hemoptysis in setting of both lovenox and plavix  REC - Continue empiric antibiotics - PCT algorithm to help guide ABX therapy, possibly limit exposure - Supplemental O2 to maintain SpO2 greater than 92% - Start solumedrol for likely AECOPD - Can Cards dc coreg ? - non specific beta blocker - Repeat CXR - Continue nebulized BDs and Dulera - Check BNP: - If elevated consider diuresis   - For Hemoptysis - -   Hold coumadin, (per Dr. York Spaniel note, this would be ok if needed for a few days)  - Continue to hold lovenox and plavix  - Monitor coags   - Doubt he would tolerate FOB to eval hemoptysis. Would suggest conservative mgmt and followup. If hemoptysis persists then bronch needed to rule out endbronchial lesion, will need under anesthesia    Joneen Roach, ACNP [facility] Pager [phone] or [phone]     STAFF MD NOTE   - I think patient has primary emphysema. If ILD, is minor component only. Basal findings on admit CT 05/22/2014 are I suspect due to infection and ae cause of hemoptysis in setting of anti-coagulation. There is no endobronchial lesion on CT so bronch will be low yield for the same as cause  of hemoptysis. Recommend letting him coool off anticoagulation for few to several days, allow pneumonia to heal and then opd followup with CT scan. If hemoptysis persits/worsens will need bronch, suspect under anesthesia. Very deconditioned: consider PT/SNF rehab   = rest per NP  Dr. Kalman Shan, M.D., [facility].C.P Pulmonary and Critical Care Medicine Staff Physician [facility]  Pulmonary and Critical Care Pager: [phone], If no answer or between  15:00h - 7:00h: call [phone]  05/29/2014 12:21 PM

## 2014-05-29 NOTE — H&P (Deleted)
Name: Travis Bond MRN: [id number] DOB: Mar 10, 1927    ADMISSION DATE:  05/22/2014 CONSULTATION DATE:  05/29/2014  REFERRING MD :  Dr. Butler Denmark  CHIEF COMPLAINT:  SOB, hemoptysis  BRIEF PATIENT DESCRIPTION: 80 year old male with severe COPD on home O2 ([facility] pt) presented 9/22 to [facility] ED for increasing SOB and hemoptysis. He was admitted and treated for CAP. His symptoms have been slow to improve and he is still having daily hemoptysis. PCCM asked to see for further evaluation.   SIGNIFICANT EVENTS  9/22 admitted for suspected CAP with hemoptysis 9/29 PCCM consult due to slow response to therapy  STUDIES:  2011 Spirometry > FEV1 1.20(47%), FVC 2.56(64%), FEV1% 46 9/22 CTA chest - No PE, c/w COPD and fibrosis. Nodular opacities c/w PNA.  Infrarenal aortic aneurysm, Numerous renal lesions grossly unchanged from prior study. 9/28 Echo > 45-50%, grade 2 DD  HISTORY OF PRESENT ILLNESS:  80 year old with PMH as described below, which includes severe COPD on home O2 (followed by [facility]), pulmonary fibrosis, CAD, Afib, Hypothyroid, CKD, and infrarenal aortic aneurysm. Progressive  started about one month ago. At that time he was started on a course of antibiotics with little benefit. 2 weeks prior to admission he additionally developed hemoptysis. Symptoms continued to worsen. He called to pulmonary office 9/18 and was directed to come to ED. 9/22 he presented to ED with these complaints, plus pleuritic chest pain. He was noted to be afebrile and without leukocytosis. He was started on CAP coverage and admitted under hospitalist service.  He was ruled out for MI. He does take coumadin for afib, which was therapeutic at time of presentation. This was continued throughout his hospitalization. Dr. Donnie Aho was asked about this and thought that holding it for a few days would be fine if that is what was needed to stop hemoptysis. He also developed nose bleeds during this admission. 9/29 he has had only minimal  improvements to his dyspnea and no real improvement to hemoptysis per patient. PCCM asked to see in consultation.   PAST MEDICAL HISTORY :   has a past medical history of COPD (chronic obstructive pulmonary disease); Chronic respiratory failure with hypoxia; Pulmonary fibrosis; Coronary artery disease; Hypertension; Hyperlipidemia; Carotid stenosis; Peripheral vascular disease; Anemia; Peripheral neuropathy; B12 deficiency; Pneumonia; Atrial fibrillation; Hypothyroidism; Anxiety state, unspecified; Unspecified deficiency anemia; Abdominal aneurysm without mention of rupture; Aortic valve disorders; Type II or unspecified type diabetes mellitus without mention of complication, not stated as uncontrolled; Unspecified hereditary and idiopathic peripheral neuropathy; Microscopic hematuria; and Chronic kidney disease.   has past surgical history that includes Inguinal hernia repair (1994); Cardioversion (09/21/2011); cardiac stent (2010 or 2011 pt not sure); and Hernia repair (1989).  Prior to Admission medications   Medication Sig Start Date End Date Taking? Authorizing Provider  albuterol (PROVENTIL HFA;VENTOLIN HFA) 108 (90 BASE) MCG/ACT inhaler Inhale 2 puffs into the lungs every 6 (six) hours as needed for wheezing or shortness of breath.   Yes Historical Provider, MD  albuterol (PROVENTIL) (2.5 MG/3ML) 0.083% nebulizer solution Take 2.5 mg by nebulization every 6 (six) hours as needed for wheezing or shortness of breath.   Yes Historical Provider, MD  ALPRAZolam Prudy Feeler) 0.25 MG tablet Take 0.25 mg by mouth at bedtime as needed for anxiety.   Yes Historical Provider, MD  calcium-vitamin D (OSCAL WITH D) 500-200 MG-UNIT per tablet Take 1 tablet by mouth daily with breakfast.   Yes Historical Provider, MD  carvedilol (COREG) 3.125 MG tablet Take 3.125  mg by mouth 2 (two) times daily with a meal.  10/26/13  Yes Historical Provider, MD  clopidogrel (PLAVIX) 75 MG tablet Take 75 mg by mouth daily.     Yes  Historical Provider, MD  digoxin (LANOXIN) 0.125 MG tablet Take 0.125 mg by mouth daily.   Yes Historical Provider, MD  fentaNYL (DURAGESIC - DOSED MCG/HR) 25 MCG/HR patch Place 25 mcg onto the skin every 3 (three) days.   Yes Historical Provider, MD  Fluticasone-Salmeterol (ADVAIR) 250-50 MCG/DOSE AEPB Inhale 1 puff into the lungs 2 (two) times daily.   Yes Historical Provider, MD  furosemide (LASIX) 20 MG tablet Take 20 mg by mouth 2 (two) times daily.   Yes Historical Provider, MD  gabapentin (NEURONTIN) 300 MG capsule Take 600 mg by mouth 2 (two) times daily.   Yes Historical Provider, MD  guaiFENesin (MUCINEX) 600 MG 12 hr tablet Take 600 mg by mouth 2 (two) times daily.   Yes Historical Provider, MD  ipratropium (ATROVENT) 0.02 % nebulizer solution Take 0.5 mg by nebulization 4 (four) times daily.   Yes Historical Provider, MD  isosorbide mononitrate (IMDUR) 60 MG 24 hr tablet Take 60 mg by mouth daily.     Yes Historical Provider, MD  levothyroxine (SYNTHROID, LEVOTHROID) 88 MCG tablet Take 88 mcg by mouth daily before breakfast.   Yes Historical Provider, MD  lubiprostone (AMITIZA) 24 MCG capsule Take 24 mcg by mouth 2 (two) times daily with a meal.   Yes Historical Provider, MD  niacin (NIASPAN) 500 MG CR tablet Take 500 mg by mouth at bedtime.     Yes Historical Provider, MD  nitroGLYCERIN (NITROSTAT) 0.4 MG SL tablet Place 0.4 mg under the tongue every 5 (five) minutes as needed for chest pain.   Yes Historical Provider, MD  pravastatin (PRAVACHOL) 20 MG tablet Take 20 mg by mouth daily.    Yes Historical Provider, MD  saccharomyces boulardii (FLORASTOR) 250 MG capsule Take 250 mg by mouth 2 (two) times daily.   Yes Historical Provider, MD  warfarin (COUMADIN) 3 MG tablet Take 3-4.5 mg by mouth daily. Take 4.5 on Tuesday ONLY, all other days take 3mg  including Sunday   Yes Historical Provider, MD  moxifloxacin (AVELOX) 400 MG tablet Take 400 mg by mouth daily at 8 pm. x7 days    Historical  Provider, MD   No Known Allergies  FAMILY HISTORY:  family history includes Coronary artery disease in his brother; Diabetes in his brother and mother; Heart disease in his brother; Heart failure in his father and mother; Hypertension in his brother. SOCIAL HISTORY:  reports that he quit smoking about 4 years ago. His smoking use included Cigarettes. He has a 65 pack-year smoking history. He has never used smokeless tobacco. He reports that he does not drink alcohol or use illicit drugs.  REVIEW OF SYSTEMS:   Bolds are positive  Constitutional: weight loss, gain, night sweats, Fevers, chills, fatigue .  HEENT: headaches, Sore throat, sneezing, nasal congestion, post nasal drip, Difficulty swallowing, Tooth/dental problems, visual complaints visual changes, ear ache CV:  chest pain, radiates: ,Orthopnea, PND, swelling in lower extremities, dizziness, palpitations, syncope.  GI  heartburn, indigestion, abdominal pain, nausea, vomiting, diarrhea, change in bowel habits, loss of appetite, bloody stools.  Resp: cough, productive: hemoptysis, dyspnea, chest pain, pleuritic.  Skin: rash or itching or icterus GU: dysuria, change in color of urine, urgency or frequency. flank pain, hematuria  MS: joint pain or swelling. decreased range of motion  Psych:  change in mood or affect. depression or anxiety.  Neuro: difficulty with speech, weakness, numbness, ataxia    SUBJECTIVE:   VITAL SIGNS: Temp:  [97.5 F (36.4 C)-98.6 F (37 C)] 98.6 F (37 C) (09/29 0700) Pulse Rate:  [72-83] 72 (09/29 0700) Resp:  [20-22] 20 (09/29 0700) BP: (100-152)/(50-68) 152/68 mmHg (09/29 0700) SpO2:  [92 %-98 %] 97 % (09/29 0917) Weight:  [65.887 kg (145 lb 4.1 oz)] 65.887 kg (145 lb 4.1 oz) (09/29 0700)  PHYSICAL EXAMINATION: General:  Elderly male in NAD on 2 Liters [place] Neuro:  Alert, oriented. HOH, slow to answer questions HEENT:  [place]/AT, PERRL, No JVD noted Cardiovascular:  RRR Lungs:  Respirations even,  unlabored. Expiratory rhonchi noted bilaterally. Dark brown sputum production, likely hemoptysis.  Abdomen:  Soft, non-tender, non-distended Musculoskeletal:  NO acute deformity or ROM limitation. Trace BLE edema.  Skin:  Intact   Recent Labs Lab 05/22/14 1825 05/23/14 0045 05/25/14 0434  NA 137 137 142  K 3.6* 3.6* 4.5  CL 96 97 106  CO2 32 31 27  BUN 17 14 14   CREATININE 0.93 0.82 0.80  GLUCOSE 99 138* 104*    Recent Labs Lab 05/24/14 1045 05/25/14 0434 05/29/14 0440  HGB 8.4* 8.3* 7.9*  HCT 27.2* 26.5* 25.1*  WBC 5.2 6.2 4.4  PLT 186 177 174   No results found.  ASSESSMENT / PLAN:  Possible CAP - was afebrile with no leukocytosis on presentation Possible Edema Severe CODP on home O2 +/- exacerbation Pulmonary fibrosis - Continue empiric antibiotics - PCT algorithm to help guide ABX therapy, possibly limit exposure - Supplemental O2 to maintain SpO2 greater than 92% - Repeat CXR - Continue nebulized BDs and Dulera  Chronic CHF in setting of recent echo with grade 2 DD and reduced LVEF - Check BNP - If elevated consider diuresis  Hemoptysis - lovenox and plavix held 9/29 - Hold coumadin, (per Dr. York Spanielilley's note, this would be ok if needed for a few days) - Continue to hold lovenox and plavix - Monitor coags - Doubt he would tolerate FOB to eval hemoptysis  Anemia acute blood loss on chronic - Management per primary team.   Joneen RoachPaul Aishi Courts, ACNP [facility] Pager [phone] or [phone]

## 2014-05-29 NOTE — Progress Notes (Signed)
Patient hemeglobin 7.9.  NP Craige CottaKirby made aware. RN will continue to monitor. Louretta ParmaAshley Raedyn Klinck, RN

## 2014-05-29 NOTE — Progress Notes (Addendum)
[facility] Progress Note   Travis Bond ZOX:[id number] DOB: 1926/11/26 DOA: 05/22/2014 PCP: No primary provider on file.  Brief narrative: Travis Bond is a 80 y.o. male admitted with pneumonia with symptoms of hemoptysis. He was recently treated for CAP 4 wks ago with 7 days. He developed recurrent symptoms about 2 wks ago and now is coughing up blood as well. 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 He is on 2-3 L of O 2 at home for COPD.    Subjective: He continues to cough up blood daily- no sputum is mixed with blood that I see in the kidney tray - he is asking to see Dr Craige Cotta today. Overall breathing well.   Assessment/Plan: Principal Problem:   Community acquired pneumonia/ hemoptysis-  severe underlying COPD - second episode of CAP- - urine strep antigen negative - Rocephin and Zithromax -Keep sats greater than 90%.-  - have asked pulm to evaluate him today as hemoptysis is not resolving with antibiotic treatment.  - plan for SNF when medically stable.   Active Problems:  Hemoptysis - have been monitoring but continuing his anticoagulants (plavix / Coumadin/ Lovenox) - will hold both Plavix and Lovenox today.     COPD - cont O2- chronically on 2.5 L at home - Nebs routinely Q 4 hrs and PRN  Atrial fibrillation - cont Dig, Coreg   CHF- systolic and diastolic- chronic  - compensated - repeat ECHO 45-50 %, grade 2 diastolic dysfunction   Drug eluting stent - CAD - will hold Plavix  Nose bleed - on 9/26- resolved  AOCD - Hb stable  Code Status: Full code Family Communication: none at bedside.  Disposition Plan: SNF when stable.  DVT prophylaxis: SCDs while off of anticoagulation  Consultants: cardiology Procedures: none  Antibiotics: Anti-infectives   Start     Dose/Rate Route Frequency Ordered Stop   05/23/14 2200  azithromycin (ZITHROMAX) tablet 500 mg     500 mg Oral Every 24 hours  05/22/14 2341 05/30/14 2159   05/23/14 2200  cefTRIAXone (ROCEPHIN) 1 g in dextrose 5 % 50 mL IVPB     1 g 100 mL/hr over 30 Minutes Intravenous Every 24 hours 05/22/14 2341 05/30/14 2159   05/23/14 0000  cefTRIAXone (ROCEPHIN) 1 g in dextrose 5 % 50 mL IVPB  Status:  Discontinued     1 g 100 mL/hr over 30 Minutes Intravenous Every 24 hours 05/22/14 2340 05/22/14 2341   05/22/14 2315  cefTRIAXone (ROCEPHIN) 1 g in dextrose 5 % 50 mL IVPB  Status:  Discontinued     1 g 100 mL/hr over 30 Minutes Intravenous Every 24 hours 05/22/14 2300 05/22/14 2339   05/22/14 2315  azithromycin (ZITHROMAX) tablet 500 mg  Status:  Discontinued     500 mg Oral Every 24 hours 05/22/14 2300 05/22/14 2340   05/22/14 2200  cefTRIAXone (ROCEPHIN) 1 g in dextrose 5 % 50 mL IVPB  Status:  Discontinued     1 g 100 mL/hr over 30 Minutes Intravenous Every 24 hours 05/22/14 2146 05/22/14 2300   05/22/14 2200  azithromycin (ZITHROMAX) 500 mg in dextrose 5 % 250 mL IVPB     500 mg 250 mL/hr over 60 Minutes Intravenous  Once 05/22/14 2146 05/22/14 2350         Objective: Filed Weights   05/27/14 0516 05/28/14 0404 05/29/14 0700  Weight: 66.2 kg (145 lb 15.1 oz) 66 kg (145 lb 8.1 oz)  65.887 kg (145 lb 4.1 oz)    Intake/Output Summary (Last 24 hours) at 05/29/14 1005 Last data filed at 05/29/14 1610  Gross per 24 hour  Intake    840 ml  Output    500 ml  Net    340 ml     Vitals Filed Vitals:   05/28/14 2114 05/28/14 2129 05/29/14 0700 05/29/14 0917  BP: 151/63  152/68   Pulse: 78  72   Temp: 98 F (36.7 C)  98.6 F (37 C)   TempSrc: Oral  Oral   Resp: 22  20   Height:      Weight:   65.887 kg (145 lb 4.1 oz)   SpO2: 97% 97% 98% 97%    Exam: General: No acute respiratory distress Lungs: wheeze and ronchi bilaterally appears to have improved.  Cardiovascular: Regular rate and rhythm without murmur gallop or rub normal S1 and S2 Abdomen: Nontender, nondistended, soft, bowel sounds positive, no  rebound, no ascites, no appreciable mass Extremities: No significant cyanosis, clubbing, or edema bilateral lower extremities  Data Reviewed: Basic Metabolic Panel:  Recent Labs Lab 05/22/14 1825 05/23/14 0045 05/25/14 0434  NA 137 137 142  K 3.6* 3.6* 4.5  CL 96 97 106  CO2 32 31 27  GLUCOSE 99 138* 104*  BUN 17 14 14   CREATININE 0.93 0.82 0.80  CALCIUM 9.1 8.7 8.4   Liver Function Tests:  Recent Labs Lab 05/22/14 1825 05/23/14 0045  AST 17 17  ALT 8 8  ALKPHOS 95 88  BILITOT 0.9 0.8  PROT 8.0 7.4  ALBUMIN 3.7 3.4*   No results found for this basename: LIPASE, AMYLASE,  in the last 168 hours No results found for this basename: AMMONIA,  in the last 168 hours CBC:  Recent Labs Lab 05/23/14 1131 05/23/14 2332 05/24/14 1045 05/25/14 0434 05/29/14 0440  WBC 5.4 6.2 5.2 6.2 4.4  HGB 9.2* 8.7* 8.4* 8.3* 7.9*  HCT 28.9* 27.3* 27.2* 26.5* 25.1*  MCV 99.3 99.3 101.5* 101.1* 100.4*  PLT 223 197 186 177 174   Cardiac Enzymes:  Recent Labs Lab 05/23/14 0045 05/23/14 0419 05/23/14 1131  TROPONINI <0.30 <0.30 <0.30   BNP (last 3 results) No results found for this basename: PROBNP,  in the last 8760 hours CBG: No results found for this basename: GLUCAP,  in the last 168 hours  Recent Results (from the past 240 hour(s))  MRSA PCR SCREENING     Status: None   Collection Time    05/22/14 11:59 PM      Result Value Ref Range Status   MRSA by PCR NEGATIVE  NEGATIVE Final   Comment:            The GeneXpert MRSA Assay (FDA     approved for NASAL specimens     only), is one component of a     comprehensive MRSA colonization     surveillance program. It is not     intended to diagnose MRSA     infection nor to guide or     monitor treatment for     MRSA infections.  CULTURE, BLOOD (ROUTINE X 2)     Status: None   Collection Time    05/23/14 12:45 AM      Result Value Ref Range Status   Specimen Description BLOOD LEFT ARM   Final   Special Requests  BOTTLES DRAWN AEROBIC ONLY 5CC   Final   Culture  Setup Time  Final   Value: 05/23/2014 08:36     Performed at [facility]   Culture     Final   Value:        BLOOD CULTURE RECEIVED NO GROWTH TO DATE CULTURE WILL BE HELD FOR 5 DAYS BEFORE ISSUING A FINAL NEGATIVE REPORT     Performed at [facility]   Report Status PENDING   Incomplete  CULTURE, BLOOD (ROUTINE X 2)     Status: None   Collection Time    05/23/14 12:55 AM      Result Value Ref Range Status   Specimen Description BLOOD LEFT HAND   Final   Special Requests BOTTLES DRAWN AEROBIC ONLY 10CC   Final   Culture  Setup Time     Final   Value: 05/23/2014 08:36     Performed at [facility]   Culture     Final   Value:        BLOOD CULTURE RECEIVED NO GROWTH TO DATE CULTURE WILL BE HELD FOR 5 DAYS BEFORE ISSUING A FINAL NEGATIVE REPORT     Performed at [facility]   Report Status PENDING   Incomplete  CULTURE, EXPECTORATED SPUTUM-ASSESSMENT     Status: None   Collection Time    05/25/14  3:32 PM      Result Value Ref Range Status   Specimen Description SPUTUM   Final   Special Requests NONE   Final   Sputum evaluation     Final   Value: THIS SPECIMEN IS ACCEPTABLE. RESPIRATORY CULTURE REPORT TO FOLLOW.   Report Status 05/25/2014 FINAL   Final  CULTURE, RESPIRATORY (NON-EXPECTORATED)     Status: None   Collection Time    05/25/14  3:32 PM      Result Value Ref Range Status   Specimen Description SPUTUM   Final   Special Requests NONE   Final   Gram Stain     Final   Value: FEW WBC PRESENT, PREDOMINANTLY MONONUCLEAR     RARE SQUAMOUS EPITHELIAL CELLS PRESENT     NO ORGANISMS SEEN     Performed at [facility]   Culture     Final   Value: NORMAL OROPHARYNGEAL FLORA     Performed at [facility]   Report Status 05/27/2014 FINAL   Final     Studies:  Recent x-ray studies have been reviewed in detail by the Attending Physician  Scheduled Meds:  Scheduled Meds: .  azithromycin  500 mg Oral Q24H  . calcium-vitamin D  1 tablet Oral Q breakfast  . carvedilol  3.125 mg Oral BID WC  . cefTRIAXone (ROCEPHIN)  IV  1 g Intravenous Q24H  . clopidogrel  75 mg Oral Daily  . digoxin  0.125 mg Oral Daily  . enoxaparin (LOVENOX) injection  65 mg Subcutaneous Q12H  . feeding supplement (ENSURE COMPLETE)  237 mL Oral BID BM  . fentaNYL  25 mcg Transdermal Q72H  . gabapentin  600 mg Oral BID  . guaiFENesin  600 mg Oral BID  . ipratropium-albuterol  3 mL Nebulization QID  . isosorbide mononitrate  60 mg Oral Daily  . levothyroxine  88 mcg Oral QAC breakfast  . lubiprostone  24 mcg Oral BID WC  . mometasone-formoterol  2 puff Inhalation BID  . niacin  500 mg Oral QHS  . saccharomyces boulardii  250 mg Oral BID  . simvastatin  10 mg Oral q1800  . Warfarin - Pharmacist Dosing  Inpatient   Does not apply q1800   Continuous Infusions:    Time spent on care of this patient: 35 min   Rosalyn Archambault, MD 05/29/2014, 10:05 AM  LOS: 7 days   [facility] Office  [phone] Pager - Text Page per [email or web address]  If 7PM-7AM, please contact night-coverage [email or web address]

## 2014-05-30 DIAGNOSIS — J449 Chronic obstructive pulmonary disease, unspecified: Secondary | ICD-10-CM

## 2014-05-30 LAB — PROTIME-INR
INR: 1.82 — ABNORMAL HIGH (ref 0.00–1.49)
PROTHROMBIN TIME: 21.1 s — AB (ref 11.6–15.2)

## 2014-05-30 LAB — FOLATE RBC: RBC FOLATE: 1195 ng/mL — AB (ref >280)

## 2014-05-30 NOTE — Progress Notes (Addendum)
[facility] Progress Note   Travis SnideGrady Bond JXB:[id number] DOB: 1927-05-16 DOA: 05/22/2014 PCP: No primary provider on file.  Brief narrative: Travis Bond is a 80 y.o. male admitted with pneumonia with symptoms of hemoptysis. He was recently treated for CAP 4 wks ago with 7 days. He developed recurrent symptoms about 2 wks ago and now is coughing up blood as well. 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 He is on 2-3 L of O 2 at home for COPD.    Subjective: Decreased hemoptysis. Overall breathing well.   Assessment/Plan: Principal Problem:   Community acquired pneumonia/ hemoptysis-  severe underlying COPD - second episode of CAP- - urine strep antigen negative , completed the course of Rocephin and Zithromax for 7 days.  -Keep sats greater than 90%.-  - appreciate pulmonary recommendations.  - plan for SNF when medically stable.   Active Problems:  Hemoptysis - appears to be better after holding the plavix and coumadin.      COPD - cont O2- chronically on 2.5 L at home - Nebs routinely Q 4 hrs and PRN  Atrial fibrillation - cont Dig, Coreg   CHF- systolic and diastolic- chronic  - compensated - repeat ECHO 45-50 %, grade 2 diastolic dysfunction   Drug eluting stent - CAD - will hold Plavix in view of the hemoptysis.   Nose bleed - on 9/26- resolved  AOCD - Hb stable  Code Status: Full code Family Communication: none at bedside.  Disposition Plan: SNF when stable.  DVT prophylaxis: SCDs while off of anticoagulation  Consultants: cardiology Procedures: none  Antibiotics: Anti-infectives   Start     Dose/Rate Route Frequency Ordered Stop   05/23/14 2200  azithromycin (ZITHROMAX) tablet 500 mg     500 mg Oral Every 24 hours 05/22/14 2341 05/29/14 2112   05/23/14 2200  cefTRIAXone (ROCEPHIN) 1 g in dextrose 5 % 50 mL IVPB     1 g 100 mL/hr over 30 Minutes Intravenous Every 24 hours 05/22/14 2341  05/29/14 2142   05/23/14 0000  cefTRIAXone (ROCEPHIN) 1 g in dextrose 5 % 50 mL IVPB  Status:  Discontinued     1 g 100 mL/hr over 30 Minutes Intravenous Every 24 hours 05/22/14 2340 05/22/14 2341   05/22/14 2315  cefTRIAXone (ROCEPHIN) 1 g in dextrose 5 % 50 mL IVPB  Status:  Discontinued     1 g 100 mL/hr over 30 Minutes Intravenous Every 24 hours 05/22/14 2300 05/22/14 2339   05/22/14 2315  azithromycin (ZITHROMAX) tablet 500 mg  Status:  Discontinued     500 mg Oral Every 24 hours 05/22/14 2300 05/22/14 2340   05/22/14 2200  cefTRIAXone (ROCEPHIN) 1 g in dextrose 5 % 50 mL IVPB  Status:  Discontinued     1 g 100 mL/hr over 30 Minutes Intravenous Every 24 hours 05/22/14 2146 05/22/14 2300   05/22/14 2200  azithromycin (ZITHROMAX) 500 mg in dextrose 5 % 250 mL IVPB     500 mg 250 mL/hr over 60 Minutes Intravenous  Once 05/22/14 2146 05/22/14 2350         Objective: Filed Weights   05/28/14 0404 05/29/14 0700 05/30/14 0619  Weight: 66 kg (145 lb 8.1 oz) 65.887 kg (145 lb 4.1 oz) 65.31 kg (143 lb 15.7 oz)    Intake/Output Summary (Last 24 hours) at 05/30/14 1514 Last data filed at 05/30/14 1352  Gross per 24 hour  Intake  920 ml  Output    540 ml  Net    380 ml     Vitals Filed Vitals:   05/30/14 0856 05/30/14 1224 05/30/14 1350 05/30/14 1504  BP:   125/57   Pulse:   69   Temp:   97.7 F (36.5 C)   TempSrc:   Oral   Resp:   14   Height:      Weight:      SpO2: 92% 92% 92% 93%    Exam: General: No acute respiratory distress Lungs: wheeze and ronchi bilaterally appears to have improved.  Cardiovascular: Regular rate and rhythm without murmur gallop or rub normal S1 and S2 Abdomen: Nontender, nondistended, soft, bowel sounds positive, no rebound, no ascites, no appreciable mass Extremities: No significant cyanosis, clubbing, or edema bilateral lower extremities  Data Reviewed: Basic Metabolic Panel:  Recent Labs Lab 05/25/14 0434  NA 142  K 4.5  CL 106   CO2 27  GLUCOSE 104*  BUN 14  CREATININE 0.80  CALCIUM 8.4   Liver Function Tests: No results found for this basename: AST, ALT, ALKPHOS, BILITOT, PROT, ALBUMIN,  in the last 168 hours No results found for this basename: LIPASE, AMYLASE,  in the last 168 hours No results found for this basename: AMMONIA,  in the last 168 hours CBC:  Recent Labs Lab 05/23/14 2332 05/24/14 1045 05/25/14 0434 05/29/14 0440 05/29/14 0900  WBC 6.2 5.2 6.2 4.4 5.8  HGB 8.7* 8.4* 8.3* 7.9* 8.2*  HCT 27.3* 27.2* 26.5* 25.1* 26.1*  MCV 99.3 101.5* 101.1* 100.4* 104.0*  PLT 197 186 177 174 181   Cardiac Enzymes: No results found for this basename: CKTOTAL, CKMB, CKMBINDEX, TROPONINI,  in the last 168 hours BNP (last 3 results)  Recent Labs  05/29/14 1230  PROBNP 1155.0*   CBG: No results found for this basename: GLUCAP,  in the last 168 hours  Recent Results (from the past 240 hour(s))  MRSA PCR SCREENING     Status: None   Collection Time    05/22/14 11:59 PM      Result Value Ref Range Status   MRSA by PCR NEGATIVE  NEGATIVE Final   Comment:            The GeneXpert MRSA Assay (FDA     approved for NASAL specimens     only), is one component of a     comprehensive MRSA colonization     surveillance program. It is not     intended to diagnose MRSA     infection nor to guide or     monitor treatment for     MRSA infections.  CULTURE, BLOOD (ROUTINE X 2)     Status: None   Collection Time    05/23/14 12:45 AM      Result Value Ref Range Status   Specimen Description BLOOD LEFT ARM   Final   Special Requests BOTTLES DRAWN AEROBIC ONLY 5CC   Final   Culture  Setup Time     Final   Value: 05/23/2014 08:36     Performed at [facility]   Culture     Final   Value: NO GROWTH 5 DAYS     Performed at [facility]   Report Status 05/29/2014 FINAL   Final  CULTURE, BLOOD (ROUTINE X 2)     Status: None   Collection Time    05/23/14 12:55 AM      Result Value Ref Range  Status   Specimen Description BLOOD LEFT HAND   Final   Special Requests BOTTLES DRAWN AEROBIC ONLY 10CC   Final   Culture  Setup Time     Final   Value: 05/23/2014 08:36     Performed at [facility]   Culture     Final   Value: NO GROWTH 5 DAYS     Performed at [facility]   Report Status 05/29/2014 FINAL   Final  CULTURE, EXPECTORATED SPUTUM-ASSESSMENT     Status: None   Collection Time    05/25/14  3:32 PM      Result Value Ref Range Status   Specimen Description SPUTUM   Final   Special Requests NONE   Final   Sputum evaluation     Final   Value: THIS SPECIMEN IS ACCEPTABLE. RESPIRATORY CULTURE REPORT TO FOLLOW.   Report Status 05/25/2014 FINAL   Final  CULTURE, RESPIRATORY (NON-EXPECTORATED)     Status: None   Collection Time    05/25/14  3:32 PM      Result Value Ref Range Status   Specimen Description SPUTUM   Final   Special Requests NONE   Final   Gram Stain     Final   Value: FEW WBC PRESENT, PREDOMINANTLY MONONUCLEAR     RARE SQUAMOUS EPITHELIAL CELLS PRESENT     NO ORGANISMS SEEN     Performed at [facility]   Culture     Final   Value: NORMAL OROPHARYNGEAL FLORA     Performed at [facility]   Report Status 05/27/2014 FINAL   Final     Studies:  Recent x-ray studies have been reviewed in detail by the Attending Physician  Scheduled Meds:  Scheduled Meds: . calcium-vitamin D  1 tablet Oral Q breakfast  . carvedilol  3.125 mg Oral BID WC  . digoxin  0.125 mg Oral Daily  . feeding supplement (ENSURE COMPLETE)  237 mL Oral BID BM  . fentaNYL  25 mcg Transdermal Q72H  . gabapentin  600 mg Oral BID  . guaiFENesin  600 mg Oral BID  . ipratropium-albuterol  3 mL Nebulization QID  . isosorbide mononitrate  60 mg Oral Daily  . levothyroxine  88 mcg Oral QAC breakfast  . lubiprostone  24 mcg Oral BID WC  . methylPREDNISolone (SOLU-MEDROL) injection  40 mg Intravenous Q6H  . mometasone-formoterol  2 puff Inhalation BID  .  niacin  500 mg Oral QHS  . saccharomyces boulardii  250 mg Oral BID  . simvastatin  10 mg Oral q1800   Continuous Infusions:    Time spent on care of this patient: 35 min   Demonica Farrey, MD 05/30/2014, 3:14 PM  LOS: 8 days   [facility] Office  [phone] Pager - Text Page per [email or web address]  If 7PM-7AM, please contact night-coverage [email or web address]

## 2014-05-30 NOTE — Progress Notes (Signed)
Name: Travis Bond Aguon MRN: [id number] DOB: 25-Aug-1927    ADMISSION DATE:  05/22/2014 CONSULTATION DATE:  05/29/2014 LOS 8 days   REFERRING MD :  Dr. Butler Denmarkizwan  CHIEF COMPLAINT:  SOB, hemoptysis  BRIEF PATIENT DESCRIPTION: 80 year old male with severe COPD on home O2 (VS pt) [facility] increasing SOB and hemoptysis. He was admitted and treated for CAP. His symptoms have been slow to improve and he is still having daily hemoptysis. PCCM asked to see for further evaluation.   SIGNIFICANT EVENTS  9/22 admitted for suspected CAP with hemoptysis 9/29 PCCM consult due to slow response to therapy, started IV steroids, held coumadin  STUDIES:  2011 Spirometry > FEV1 1.20(47%), FVC 2.56(64%), FEV1% 46 9/22 CTA chest - No PE, shows findings of ILD at base but these seem new compaed to 2014 Abd CT lung cut and 2011 CT chest. I supsect these are infectious findings and can explain cause for hemoptysis in setting of both lovenox and plavix 9/28 Echo > 45-50%, grade 2 DD  SUBJECTIVE:  Feels better today. Less dyspnea, less hemoptysis. Reports only one episode of minimal brown bloody sputum last night.   VITAL SIGNS: Temp:  [98 F (36.7 C)-98.1 F (36.7 C)] 98 F (36.7 C) (09/30 0619) Pulse Rate:  [74-96] 90 (09/30 0619) Resp:  [20-22] 20 (09/30 0619) BP: (103-168)/(49-76) 168/76 mmHg (09/30 0619) SpO2:  [92 %-99 %] 92 % (09/30 0856) Weight:  [65.31 kg (143 lb 15.7 oz)] 65.31 kg (143 lb 15.7 oz) (09/30 [id number])  PHYSICAL EXAMINATION: General:  Elderly male in NAD on 2 Liters [place], Frail, deconditioned Neuro:  Alert, oriented. HOH, HEENT:  [place]/AT, PERRL, No JVD noted Cardiovascular:  RRR Lungs:  Respirations even, unlabored. Lungs much clearer today. Air movement improved but still diminished Abdomen:  Soft, non-tender, non-distended Musculoskeletal:  No acute deformity or ROM limitation. Trace BLE edema.  Skin:  Intact   PULMONARY No results found for this basename: PHART,  PCO2, PCO2ART, PO2, PO2ART, HCO3, TCO2, O2SAT,  in the last 168 hours  CBC  Recent Labs Lab 05/25/14 0434 05/29/14 0440 05/29/14 0900  HGB 8.3* 7.9* 8.2*  HCT 26.5* 25.1* 26.1*  WBC 6.2 4.4 5.8  PLT 177 174 181    COAGULATION  Recent Labs Lab 05/26/14 0515 05/27/14 0415 05/28/14 0401 05/29/14 0440 05/30/14 0456  INR 2.48* 2.16* 1.83* 1.91* 1.82*    CARDIAC    Recent Labs Lab 05/23/14 1131  TROPONINI <0.30    Recent Labs Lab 05/29/14 1230  PROBNP 1155.0*     CHEMISTRY  Recent Labs Lab 05/25/14 0434  NA 142  K 4.5  CL 106  CO2 27  GLUCOSE 104*  BUN 14  CREATININE 0.80  CALCIUM 8.4   Estimated Creatinine Clearance: 63.5 ml/min (by C-G formula based on Cr of 0.8).   LIVER  Recent Labs Lab 05/26/14 0515 05/27/14 0415 05/28/14 0401 05/29/14 0440 05/30/14 0456  INR 2.48* 2.16* 1.83* 1.91* 1.82*     INFECTIOUS  Recent Labs Lab 05/29/14 1205  PROCALCITON <0.10     ENDOCRINE CBG (last 3)  No results found for this basename: GLUCAP,  in the last 72 hours   IMAGING x48h Dg Chest 2 View  05/29/2014   CLINICAL DATA:  80 year old male with shortness of breath.  EXAM: CHEST  2 VIEW  COMPARISON:  05/22/2014 chest CT and radiograph. Prior chest radiographs dating back to 08/01/2010.  FINDINGS: Mild cardiomegaly and COPD/emphysema again identified.  Mild airspace  opacities in both lower lungs again noted.  A nodular opacity along the lateral right mid long corresponds to the nodule identified on recent CT.  No new abnormalities are identified. There is no evidence of pneumothorax or definite pleural effusion.  IMPRESSION: Unchanged bilateral lower lung airspace opacities with lateral right mid lung nodular opacity now better visualize radiographically.  Mild cardiomegaly and COPD/emphysema.   Electronically Signed   By: Laveda Abbe M.D.   On: 05/29/2014 15:50    ASSESSMENT / PLAN:   Severe CODP with baseline chronic respiratoyr faiure on home  O2 +/- exacerbation - Supplemental O2 to maintain SpO2 greater than 92% - Continue solu-medrol, begin to taper as tolerated 10/1 - Consider d/c Coreg if cardiology is amenable, non-selective BB  - Continue nebulized BDs and Dulera - Consider diuresis if improvement halts, BNP elevated, would hold for now as he is improving.   Possible CAP (PE ruled out)- viral v bacterial- was afebrile with no leukocytosis on presentation - Consider d/c Antibiotics. Currently day 7 and PCT negative - Trend PCT  Hemoptysis (improving) - Continue to hold coumadin (first held 9/29), (per Dr. York Spaniel note, this would be ok for a few days) - Continue to hold lovenox and plavix - Monitor coags - Doubt he would tolerate FOB to eval hemoptysis. Would suggest conservative mgmt and followup. If hemoptysis persists then bronch needed to rule out endbronchial lesion, will need under anesthesia so will likely need to be moved down to the ICU and intubated for that.  General: Patient likely has primary emphysema. If ILD, is minor component only.  There is no endobronchial lesion on CT so bronch will be low yield for the same as cause of hemoptysis. Recommend letting him coool off anticoagulation for few to several days, allow pneumonia to heal and then opt followup with CT scan. Very deconditioned: consider PT/SNF rehab  Joneen Roach, ACNP [facility] Pager [phone] or [phone]  Above note edited in full.  Patient seen and examined, agree with above note.  Patient seen and examined, agree with above note.  I dictated the care and orders written for this patient under my direction.  Alyson Reedy, MD [phone]  05/30/2014 10:10 AM

## 2014-05-30 NOTE — Progress Notes (Signed)
CSW (Clinical Child psychotherapistocial Worker) continues to follow pt and provide Joetta MannersBlumenthal with updates. Facility will need to be notified of dc in timely manner so they can arrange to complete paperwork with family.  Fiorella Hanahan, LCSWA [phone]

## 2014-05-30 NOTE — Progress Notes (Signed)
Physical Therapy Treatment Patient Details Name: Travis Bond MRN: [id number] DOB: Sep 08, 1926 Today's Date: 05/30/2014    History of Present Illness Travis Bond is a 80 y.o. male with PMH of CKD-III, COPD, A. fib on coumadin, coronary artery disease (post status of stent placement 5 years ago), GERD, aortic stenosis, who presents with shortness of breath and hemoptysis.Patient reports that he was treated for pneumonia as outpatient 4 weeks ago. CTA showed no evidence of pulmonary embolism, but has reticular nodular infiltrate which suggests pneumonia per radiologist. Hgb 9.3 (from 11.1 in 6/15)    PT Comments    Pt progressing towards physical therapy goals. When PT first began exercise with pt he was on 2L/min supplemental O2 at 92% sats. As pt progressed through therapeutic exercise sats decreased to 87% and O2 was increased to 3L/min. Pt was able to maintain sats at 94% for the rest of session, with many rest breaks and utilizing pursed-lip breathing techniques. Pt states he feels the best around 2pm and PT will try to schedule therapy around that time in the future.   Follow Up Recommendations  SNF;Supervision/Assistance - 24 hour     Equipment Recommendations  Other (comment) (Rollator if home)    Recommendations for Other Services OT consult     Precautions / Restrictions Precautions Precautions: Fall Precaution Comments: watch 02 sats Restrictions Weight Bearing Restrictions: No    Mobility  Bed Mobility               General bed mobility comments: Pt was sitting in recliner chair upon PT arrival.   Transfers                 General transfer comment: Deferred as focus of treatment session was therapeutic exercise  Ambulation/Gait                 Stairs            Wheelchair Mobility    Modified Rankin (Stroke Patients Only)       Balance   Sitting-balance support: Feet supported;No upper extremity supported Sitting  balance-Leahy Scale: Good                              Cognition Arousal/Alertness: Awake/alert Behavior During Therapy: WFL for tasks assessed/performed;Flat affect Overall Cognitive Status: Within Functional Limits for tasks assessed                      Exercises General Exercises - Lower Extremity Ankle Circles/Pumps: 20 reps Quad Sets: 15 reps Short Arc Quad: 15 reps Long Arc Quad: 15 reps Hip ABduction/ADduction: 15 reps (Also x15 isometric add with pillow)    General Comments        Pertinent Vitals/Pain Pain Assessment: No/denies pain    Home Living                      Prior Function            PT Goals (current goals can now be found in the care plan section) Acute Rehab PT Goals Patient Stated Goal: Walk more PT Goal Formulation: With patient Time For Goal Achievement: 06/06/14 Potential to Achieve Goals: Fair Progress towards PT goals: Progressing toward goals    Frequency  Min 2X/week    PT Plan Current plan remains appropriate    Co-evaluation             End of  Session Equipment Utilized During Treatment: Oxygen Activity Tolerance: Patient limited by fatigue Patient left: in chair;with call bell/phone within reach     Time: 0938-1001 PT Time Calculation (min): 23 min  Charges:  $Therapeutic Exercise: 23-37 mins                    G Codes:      Ruthann CancerHamilton, Helon Wisinski 05/30/2014, 12:09 PM  Ruthann CancerLaura Hamilton, PT, DPT Acute Rehabilitation Services Pager: [phone]

## 2014-05-31 ENCOUNTER — Inpatient Hospital Stay (HOSPITAL_COMMUNITY): Payer: Medicare Other

## 2014-05-31 DIAGNOSIS — J9611 Chronic respiratory failure with hypoxia: Secondary | ICD-10-CM

## 2014-05-31 DIAGNOSIS — J189 Pneumonia, unspecified organism: Principal | ICD-10-CM

## 2014-05-31 DIAGNOSIS — R042 Hemoptysis: Secondary | ICD-10-CM

## 2014-05-31 LAB — BASIC METABOLIC PANEL
ANION GAP: 9 (ref 5–15)
BUN: 25 mg/dL — ABNORMAL HIGH (ref 6–23)
CALCIUM: 8.8 mg/dL (ref 8.4–10.5)
CO2: 30 mEq/L (ref 19–32)
Chloride: 100 mEq/L (ref 96–112)
Creatinine, Ser: 0.8 mg/dL (ref 0.50–1.35)
GFR calc Af Amer: >90 mL/min (ref >90)
GFR calc non Af Amer: 80 mL/min — ABNORMAL LOW (ref >90)
Glucose, Bld: 131 mg/dL — ABNORMAL HIGH (ref 70–99)
POTASSIUM: 4.6 meq/L (ref 3.7–5.3)
SODIUM: 139 meq/L (ref 137–147)

## 2014-05-31 LAB — CBC
HCT: 25.2 % — ABNORMAL LOW (ref 39.0–52.0)
Hemoglobin: 8 g/dL — ABNORMAL LOW (ref 13.0–17.0)
MCH: 32.8 pg (ref 26.0–34.0)
MCHC: 31.7 g/dL (ref 30.0–36.0)
MCV: 103.3 fL — ABNORMAL HIGH (ref 78.0–100.0)
PLATELETS: 185 10*3/uL (ref 150–400)
RBC: 2.44 MIL/uL — AB (ref 4.22–5.81)
RDW: 18 % — ABNORMAL HIGH (ref 11.5–15.5)
WBC: 7.3 10*3/uL (ref 4.0–10.5)

## 2014-05-31 LAB — PROTIME-INR
INR: 1.73 — AB (ref 0.00–1.49)
PROTHROMBIN TIME: 20.3 s — AB (ref 11.6–15.2)

## 2014-05-31 LAB — PROCALCITONIN: Procalcitonin: <0.1 ng/mL

## 2014-05-31 MED ORDER — PREDNISONE 20 MG PO TABS
40.0000 mg | ORAL_TABLET | Freq: Every day | ORAL | Status: DC
  Administered 2014-06-01 – 2014-06-04 (×4): 40 mg via ORAL
  Filled 2014-05-31 (×6): qty 2

## 2014-05-31 MED ORDER — LEVOFLOXACIN IN D5W 750 MG/150ML IV SOLN
750.0000 mg | INTRAVENOUS | Status: AC
  Administered 2014-05-31 – 2014-06-02 (×3): 750 mg via INTRAVENOUS
  Filled 2014-05-31 (×3): qty 150

## 2014-05-31 NOTE — Progress Notes (Signed)
Pt stated" Don't feel I can walk today just feel week.".  Will continue to monitor.  Amanda PeaNellie Aniyia Rane, RN,

## 2014-05-31 NOTE — Progress Notes (Signed)
[facility] Progress Note   Travis Bond WUJ:[id number] DOB: 1927-07-03 DOA: 05/22/2014 PCP: No primary provider on file.  Brief narrative: Travis Bond is a 80 y.o. male admitted with pneumonia with symptoms of hemoptysis. He was recently treated for CAP 4 wks ago with 7 days. He developed recurrent symptoms about 2 wks ago and now is coughing up blood as well. 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 He is on 2-3 L of O 2 at home for COPD.    Subjective: Decreased hemoptysis. Overall breathing well.   Assessment/Plan: Principal Problem:   Community acquired pneumonia/ hemoptysis-  severe underlying COPD - second episode of CAP- - urine strep antigen negative , completed the course of Rocephin and Zithromax for 7 days. But patient continues to have hemoptysis and productive sputum. Will add levaquin for days and monitor.  -Keep sats greater than 90%.-  - appreciate pulmonary recommendations.  - plan for SNF when medically stable.   Active Problems:  Hemoptysis - appears to decreased in amount  after holding the plavix and coumadin, but still persistent.      COPD - cont O2- chronically on 2.5 L at home - Nebs routinely Q 4 hrs and PRN  Atrial fibrillation - cont Dig, Coreg   CHF- systolic and diastolic- chronic  - compensated - repeat ECHO 45-50 %, grade 2 diastolic dysfunction   Drug eluting stent - CAD - will hold Plavix in view of the hemoptysis.   Nose bleed - on 9/26- resolved  AOCD - Hb stable  Code Status: Full code Family Communication: none at bedside.  Disposition Plan: SNF when stable.  DVT prophylaxis: SCDs while off of anticoagulation  Consultants: cardiology Procedures: none  Antibiotics: Anti-infectives   Start     Dose/Rate Route Frequency Ordered Stop   05/23/14 2200  azithromycin (ZITHROMAX) tablet 500 mg     500 mg Oral Every 24 hours 05/22/14 2341 05/29/14 2112   05/23/14 2200  cefTRIAXone (ROCEPHIN) 1 g in dextrose 5 % 50 mL IVPB     1 g 100 mL/hr over 30 Minutes Intravenous Every 24 hours 05/22/14 2341 05/29/14 2142   05/23/14 0000  cefTRIAXone (ROCEPHIN) 1 g in dextrose 5 % 50 mL IVPB  Status:  Discontinued     1 g 100 mL/hr over 30 Minutes Intravenous Every 24 hours 05/22/14 2340 05/22/14 2341   05/22/14 2315  cefTRIAXone (ROCEPHIN) 1 g in dextrose 5 % 50 mL IVPB  Status:  Discontinued     1 g 100 mL/hr over 30 Minutes Intravenous Every 24 hours 05/22/14 2300 05/22/14 2339   05/22/14 2315  azithromycin (ZITHROMAX) tablet 500 mg  Status:  Discontinued     500 mg Oral Every 24 hours 05/22/14 2300 05/22/14 2340   05/22/14 2200  cefTRIAXone (ROCEPHIN) 1 g in dextrose 5 % 50 mL IVPB  Status:  Discontinued     1 g 100 mL/hr over 30 Minutes Intravenous Every 24 hours 05/22/14 2146 05/22/14 2300   05/22/14 2200  azithromycin (ZITHROMAX) 500 mg in dextrose 5 % 250 mL IVPB     500 mg 250 mL/hr over 60 Minutes Intravenous  Once 05/22/14 2146 05/22/14 2350         Objective: Filed Weights   05/29/14 0700 05/30/14 0619 05/31/14 9562  Weight: 65.887 kg (145 lb 4.1 oz) 65.31 kg (143 lb 15.7 oz) 67.223 kg (148 lb 3.2 oz)    Intake/Output Summary (Last  24 hours) at 05/31/14 1523 Last data filed at 05/31/14 1334  Gross per 24 hour  Intake    840 ml  Output      0 ml  Net    840 ml     Vitals Filed Vitals:   05/31/14 0826 05/31/14 0934 05/31/14 1145 05/31/14 1411  BP: 134/65   117/69  Pulse: 91 79  78  Temp:    98.3 F (36.8 C)  TempSrc:    Oral  Resp:    16  Height:      Weight:      SpO2:   95% 97%    Exam: General: No acute respiratory distress Lungs: wheeze and ronchi bilaterally appears to have improved.  Cardiovascular: Regular rate and rhythm without murmur gallop or rub normal S1 and S2 Abdomen: Nontender, nondistended, soft, bowel sounds positive, no rebound, no ascites, no appreciable mass Extremities: No significant  cyanosis, clubbing, or edema bilateral lower extremities  Data Reviewed: Basic Metabolic Panel:  Recent Labs Lab 05/25/14 0434 05/31/14 0535  NA 142 139  K 4.5 4.6  CL 106 100  CO2 27 30  GLUCOSE 104* 131*  BUN 14 25*  CREATININE 0.80 0.80  CALCIUM 8.4 8.8   Liver Function Tests: No results found for this basename: AST, ALT, ALKPHOS, BILITOT, PROT, ALBUMIN,  in the last 168 hours No results found for this basename: LIPASE, AMYLASE,  in the last 168 hours No results found for this basename: AMMONIA,  in the last 168 hours CBC:  Recent Labs Lab 05/25/14 0434 05/29/14 0440 05/29/14 0900 05/31/14 0535  WBC 6.2 4.4 5.8 7.3  HGB 8.3* 7.9* 8.2* 8.0*  HCT 26.5* 25.1* 26.1* 25.2*  MCV 101.1* 100.4* 104.0* 103.3*  PLT 177 174 181 185   Cardiac Enzymes: No results found for this basename: CKTOTAL, CKMB, CKMBINDEX, TROPONINI,  in the last 168 hours BNP (last 3 results)  Recent Labs  05/29/14 1230  PROBNP 1155.0*   CBG: No results found for this basename: GLUCAP,  in the last 168 hours  Recent Results (from the past 240 hour(s))  MRSA PCR SCREENING     Status: None   Collection Time    05/22/14 11:59 PM      Result Value Ref Range Status   MRSA by PCR NEGATIVE  NEGATIVE Final   Comment:            The GeneXpert MRSA Assay (FDA     approved for NASAL specimens     only), is one component of a     comprehensive MRSA colonization     surveillance program. It is not     intended to diagnose MRSA     infection nor to guide or     monitor treatment for     MRSA infections.  CULTURE, BLOOD (ROUTINE X 2)     Status: None   Collection Time    05/23/14 12:45 AM      Result Value Ref Range Status   Specimen Description BLOOD LEFT ARM   Final   Special Requests BOTTLES DRAWN AEROBIC ONLY 5CC   Final   Culture  Setup Time     Final   Value: 05/23/2014 08:36     Performed at [facility]   Culture     Final   Value: NO GROWTH 5 DAYS     Performed at [facility]   Report Status 05/29/2014 FINAL   Final  CULTURE, BLOOD (ROUTINE  X 2)     Status: None   Collection Time    05/23/14 12:55 AM      Result Value Ref Range Status   Specimen Description BLOOD LEFT HAND   Final   Special Requests BOTTLES DRAWN AEROBIC ONLY 10CC   Final   Culture  Setup Time     Final   Value: 05/23/2014 08:36     [facility]   Culture     Final   Value: NO GROWTH 5 DAYS     [facility]   Report Status 05/29/2014 FINAL   Final  CULTURE, EXPECTORATED SPUTUM-ASSESSMENT     Status: None   Collection Time    05/25/14  3:32 PM      Result Value Ref Range Status   Specimen Description SPUTUM   Final   Special Requests NONE   Final   Sputum evaluation     Final   Value: THIS SPECIMEN IS ACCEPTABLE. RESPIRATORY CULTURE REPORT TO FOLLOW.   Report Status 05/25/2014 FINAL   Final  CULTURE, RESPIRATORY (NON-EXPECTORATED)     Status: None   Collection Time    05/25/14  3:32 PM      Result Value Ref Range Status   Specimen Description SPUTUM   Final   Special Requests NONE   Final   Gram Stain     Final   Value: FEW WBC PRESENT, PREDOMINANTLY MONONUCLEAR     RARE SQUAMOUS EPITHELIAL CELLS PRESENT     NO ORGANISMS SEEN     [facility]   Culture     Final   Value: NORMAL OROPHARYNGEAL FLORA     [facility]formed at [facility]   Report Status 05/27/2014 FINAL   Final     Studies:  Recent x-ray studies have been reviewed in detail by the Attending Physician  Scheduled Meds:  Scheduled Meds: . calcium-vitamin D  1 tablet Oral Q breakfast  . carvedilol  3.125 mg Oral BID WC  . digoxin  0.125 mg Oral Daily  . feeding supplement (ENSURE COMPLETE)  237 mL Oral BID BM  . fentaNYL  25 mcg Transdermal Q72H  . gabapentin  600 mg Oral BID  . guaiFENesin  600 mg Oral BID  . ipratropium-albuterol  3 mL Nebulization QID  . isosorbide mononitrate  60 mg Oral Daily  . levothyroxine  88 mcg Oral QAC  breakfast  . lubiprostone  24 mcg Oral BID WC  . mometasone-formoterol  2 puff Inhalation BID  . niacin  500 mg Oral QHS  . [START ON 06/01/2014] predniSONE  40 mg Oral Q breakfast  . saccharomyces boulardii  250 mg Oral BID  . simvastatin  10 mg Oral q1800   Continuous Infusions:    Time spent on care of this patient: 35 min   Nino Amano, MD 05/31/2014, 3:23 PM  LOS: 9 days   [facility] Office  [phone] Pager - Text Page per [email or web address]  If 7PM-7AM, please contact night-coverage [email or web address]

## 2014-05-31 NOTE — Progress Notes (Signed)
Name: Travis Bond MRN: [id number] DOB: Dec 11, 1926    ADMISSION DATE:  05/22/2014 CONSULTATION DATE:  05/29/2014 LOS 9 days   REFERRING MD :  Dr. Butler Denmarkizwan  CHIEF COMPLAINT:  SOB, hemoptysis  BRIEF PATIENT DESCRIPTION: 80 year old male with severe COPD on home O2 (VS pt) [facility] SOB and hemoptysis. He was admitted and treated for CAP. His symptoms have been slow to improve and he is still having daily hemoptysis. PCCM asked to see for further evaluation.   SIGNIFICANT EVENTS  9/22 admitted for suspected CAP with hemoptysis 9/29 PCCM consult due to slow response to therapy, started IV steroids, held coumadin  STUDIES:  2011 Spirometry > FEV1 1.20(47%), FVC 2.56(64%), FEV1% 46 9/22 CTA chest - No PE, shows findings of ILD at base but these seem new compaed to 2014 Abd CT lung cut and 2011 CT chest. I supsect these are infectious findings and can explain cause for hemoptysis in setting of both lovenox and plavix 9/28 Echo > 45-50%, grade 2 DD  SUBJECTIVE:  Feels better today. Less dyspnea.  No hemoptysis since yesterday.   VITAL SIGNS: Temp:  [97.7 F (36.5 C)-98.1 F (36.7 C)] 98.1 F (36.7 C) (10/01 0628) Pulse Rate:  [69-91] 79 (10/01 0934) Resp:  [14-18] 18 (10/01 0628) BP: (105-134)/(57-90) 134/65 mmHg (10/01 0826) SpO2:  [92 %-96 %] 96 % (10/01 0745) Weight:  [148 lb 3.2 oz (67.223 kg)] 148 lb 3.2 oz (67.223 kg) (10/01 [id number])  PHYSICAL EXAMINATION: General:  Elderly male in NAD on 2 [place], [place], deconditioned  Neuro:  Alert, oriented. HOH, HEENT:  [place]/AT, PERRL, No JVD noted Cardiovascular:  RRR Lungs:  Respirations even, unlabored. Diminished throughout.  Abdomen:  Soft, non-tender, non-distended Musculoskeletal:  No acute deformity or ROM limitation. Trace BLE edema.  Skin:  Intact   PULMONARY No results found for this basename: PHART, PCO2, PCO2ART, PO2, PO2ART, HCO3, TCO2, O2SAT,  in the last 168 hours  CBC  Recent  Labs Lab 05/29/14 0440 05/29/14 0900 05/31/14 0535  HGB 7.9* 8.2* 8.0*  HCT 25.1* 26.1* 25.2*  WBC 4.4 5.8 7.3  PLT 174 181 185    COAGULATION  Recent Labs Lab 05/27/14 0415 05/28/14 0401 05/29/14 0440 05/30/14 0456 05/31/14 0535  INR 2.16* 1.83* 1.91* 1.82* 1.73*    CARDIAC   No results found for this basename: TROPONINI,  in the last 168 hours  Recent Labs Lab 05/29/14 1230  PROBNP 1155.0*     CHEMISTRY  Recent Labs Lab 05/25/14 0434 05/31/14 0535  NA 142 139  K 4.5 4.6  CL 106 100  CO2 27 30  GLUCOSE 104* 131*  BUN 14 25*  CREATININE 0.80 0.80  CALCIUM 8.4 8.8   Estimated Creatinine Clearance: 65.3 ml/min (by C-G formula based on Cr of 0.8).   LIVER  Recent Labs Lab 05/27/14 0415 05/28/14 0401 05/29/14 0440 05/30/14 0456 05/31/14 0535  INR 2.16* 1.83* 1.91* 1.82* 1.73*     INFECTIOUS  Recent Labs Lab 05/29/14 1205 05/31/14 0537  PROCALCITON <0.10 <0.10     ENDOCRINE CBG (last 3)  No results found for this basename: GLUCAP,  in the last 72 hours   IMAGING x48h Dg Chest 2 View  05/29/2014   CLINICAL DATA:  80 year old male with shortness of breath.  EXAM: CHEST  2 VIEW  COMPARISON:  05/22/2014 chest CT and radiograph. Prior chest radiographs dating back to 08/01/2010.  FINDINGS: Mild cardiomegaly and COPD/emphysema again identified.  Mild  airspace opacities in both lower lungs again noted.  A nodular opacity along the lateral right mid long corresponds to the nodule identified on recent CT.  No new abnormalities are identified. There is no evidence of pneumothorax or definite pleural effusion.  IMPRESSION: Unchanged bilateral lower lung airspace opacities with lateral right mid lung nodular opacity now better visualize radiographically.  Mild cardiomegaly and COPD/emphysema.   Electronically Signed   By: Laveda Abbe M.D.   On: 05/29/2014 15:50    ASSESSMENT / PLAN:   Acute on chronic respiratory failure - in setting severe COPD on  home O2 +/- exacerbation, deconditioning, pulm HTN (pA press on echo 9/28)  - Supplemental O2 to maintain SpO2 greater than 92% - Change solumedrol to PO pred with slow taper 10/1 - Consider d/c Coreg or change to bisoprolol if cardiology is amenable, (non-selective BB) - Continue nebulized BDs and Dulera - Consider diuresis if improvement halts, BNP elevated, would hold for now as he is improving.   Possible CAP (PE ruled out)- viral v bacterial- was afebrile with no leukocytosis on presentation.  S/p 7 day course abx.  -monitor off abx   Hemoptysis (Resolved) - Continue to hold coumadin (first held 9/29), (per Dr. York Spaniel note, this would be ok for a few days) - Continue to hold lovenox and plavix - Monitor coags - Doubt he would tolerate FOB to eval hemoptysis. Would suggest conservative mgmt and followup.  - Recommend keeping him off anticoagulation for few to several days, allow pneumonia to heal and then opt followup with CT scan.   Very deconditioned: consider PT/SNF rehab  Dirk Dress, NP 05/31/2014  11:10 AM Pager: [phone] or [phone]  Too deconditioned to tolerate and there are little indications for hemoptysis at this point, would continue to hold off anticoag then can do a trial with heparin before full dose coumadin.  If hemoptysis recurs then would have to reconsider bronchoscopy.  PCCM will sign off, please call back if hemoptysis recurs.  *Care during the described time interval was provided by me and/or other providers on the critical care team. I have reviewed this patient's available data, including medical history, events of note, physical examination and test results as part of my evaluation.  Alyson Reedy, M.D. [facility] Pulmonary/Critical Care Medicine. Pager: [phone]. After hours pager: [phone].

## 2014-06-01 LAB — PROTIME-INR
INR: 1.63 — ABNORMAL HIGH (ref 0.00–1.49)
Prothrombin Time: 19.3 seconds — ABNORMAL HIGH (ref 11.6–15.2)

## 2014-06-01 MED ORDER — VITAMIN B-12 1000 MCG PO TABS
1000.0000 ug | ORAL_TABLET | Freq: Every day | ORAL | Status: DC
  Administered 2014-06-01 – 2014-06-04 (×4): 1000 ug via ORAL
  Filled 2014-06-01 (×4): qty 1

## 2014-06-01 NOTE — Progress Notes (Signed)
Unable to ambulate pt as he feels SOB with exertion. Dr. Blake DivineAkula made aware and request for PT to eval.  Amanda PeaNellie Ahni Bradwell, RN.

## 2014-06-01 NOTE — Progress Notes (Signed)
[facility] Progress Note   Travis Bond WUJ:[id number] DOB: [birth year] DOA: 05/22/2014 PCP: No primary provider on file.  Brief narrative: Travis Bond is a 80 y.o. male admitted with pneumonia with symptoms of hemoptysis. He was recently treated for CAP 4 wks ago with 7 days. He developed recurrent symptoms about 2 wks ago and now is coughing up blood as well. 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 He is on 2-3 L of O 2 at home for COPD. He reports hemotysis    Subjective: Decreased hemoptysis. Overall breathing well.   Assessment/Plan: Principal Problem:   Community acquired pneumonia/ hemoptysis-  severe underlying COPD - second episode of CAP- - urine strep antigen negative , completed the course of Rocephin and Zithromax for 7 days. But patient continues to have hemoptysis and productive sputum. Will add levaquin for 3more days and monitor. Repeat CXR shows improvement of the pneumonia.  -Keep sats greater than 90%.-  - appreciate pulmonary recommendations.  - plan for SNF when medically stable.   Active Problems:  Hemoptysis - appears to decreased in amount  after holding the plavix and coumadin, but still persistent.      COPD - cont O2- chronically on 2.5 L at home - Nebs routinely Q 4 hrs and PRN  Atrial fibrillation - cont Dig, Coreg   CHF- systolic and diastolic- chronic  - compensated - repeat ECHO 45-50 %, grade 2 diastolic dysfunction   Drug eluting stent - CAD - will hold Plavix in view of the hemoptysis.   Nose bleed - on 9/26- resolved  AOCD - Hb stable  Code Status: Full code Family Communication: none at bedside.  Disposition Plan: SNF when stable.  DVT prophylaxis: SCDs while off of anticoagulation  Consultants: cardiology Procedures: none  Antibiotics: Anti-infectives   Start     Dose/Rate Route Frequency Ordered Stop   05/31/14 1530  levofloxacin (LEVAQUIN) IVPB 750 mg      750 mg 100 mL/hr over 90 Minutes Intravenous Every 24 hours 05/31/14 1526 06/03/14 1529   05/23/14 2200  azithromycin (ZITHROMAX) tablet 500 mg     500 mg Oral Every 24 hours 05/22/14 2341 05/29/14 2112   05/23/14 2200  cefTRIAXone (ROCEPHIN) 1 g in dextrose 5 % 50 mL IVPB     1 g 100 mL/hr over 30 Minutes Intravenous Every 24 hours 05/22/14 2341 05/29/14 2142   05/23/14 0000  cefTRIAXone (ROCEPHIN) 1 g in dextrose 5 % 50 mL IVPB  Status:  Discontinued     1 g 100 mL/hr over 30 Minutes Intravenous Every 24 hours 05/22/14 2340 05/22/14 2341   05/22/14 2315  cefTRIAXone (ROCEPHIN) 1 g in dextrose 5 % 50 mL IVPB  Status:  Discontinued     1 g 100 mL/hr over 30 Minutes Intravenous Every 24 hours 05/22/14 2300 05/22/14 2339   05/22/14 2315  azithromycin (ZITHROMAX) tablet 500 mg  Status:  Discontinued     500 mg Oral Every 24 hours 05/22/14 2300 05/22/14 2340   05/22/14 2200  cefTRIAXone (ROCEPHIN) 1 g in dextrose 5 % 50 mL IVPB  Status:  Discontinued     1 g 100 mL/hr over 30 Minutes Intravenous Every 24 hours 05/22/14 2146 05/22/14 2300   05/22/14 2200  azithromycin (ZITHROMAX) 500 mg in dextrose 5 % 250 mL IVPB     500 mg 250 mL/hr over 60 Minutes Intravenous  Once 05/22/14 2146 05/22/14 2350  Objective: Filed Weights   05/30/14 0619 05/31/14 0632 06/01/14 0531  Weight: 65.31 kg (143 lb 15.7 oz) 67.223 kg (148 lb 3.2 oz) 68.312 kg (150 lb 9.6 oz)    Intake/Output Summary (Last 24 hours) at 06/01/14 1410 Last data filed at 06/01/14 1408  Gross per 24 hour  Intake    930 ml  Output    376 ml  Net    554 ml     Vitals Filed Vitals:   06/01/14 0531 06/01/14 0613 06/01/14 0837 06/01/14 0909  BP:  115/59 138/70   Pulse:  81 95   Temp:  97.8 F (36.6 C)    TempSrc:  Oral    Resp:  18    Height:      Weight: 68.312 kg (150 lb 9.6 oz)     SpO2:  95%  93%    Exam: General: No acute respiratory distress Lungs: wheeze and ronchi bilaterally appears to have  improved.  Cardiovascular: Regular rate and rhythm without murmur gallop or rub normal S1 and S2 Abdomen: Nontender, nondistended, soft, bowel sounds positive, no rebound, no ascites, no appreciable mass Extremities: No significant cyanosis, clubbing, or edema bilateral lower extremities  Data Reviewed: Basic Metabolic Panel:  Recent Labs Lab 05/31/14 0535  NA 139  K 4.6  CL 100  CO2 30  GLUCOSE 131*  BUN 25*  CREATININE 0.80  CALCIUM 8.8   Liver Function Tests: No results found for this basename: AST, ALT, ALKPHOS, BILITOT, PROT, ALBUMIN,  in the last 168 hours No results found for this basename: LIPASE, AMYLASE,  in the last 168 hours No results found for this basename: AMMONIA,  in the last 168 hours CBC:  Recent Labs Lab 05/29/14 0440 05/29/14 0900 05/31/14 0535  WBC 4.4 5.8 7.3  HGB 7.9* 8.2* 8.0*  HCT 25.1* 26.1* 25.2*  MCV 100.4* 104.0* 103.3*  PLT 174 181 185   Cardiac Enzymes: No results found for this basename: CKTOTAL, CKMB, CKMBINDEX, TROPONINI,  in the last 168 hours BNP (last 3 results)  Recent Labs  05/29/14 1230  PROBNP 1155.0*   CBG: No results found for this basename: GLUCAP,  in the last 168 hours  Recent Results (from the past 240 hour(s))  MRSA PCR SCREENING     Status: None   Collection Time    05/22/14 11:59 PM      Result Value Ref Range Status   MRSA by PCR NEGATIVE  NEGATIVE Final   Comment:            The GeneXpert MRSA Assay (FDA     approved for NASAL specimens     only), is one component of a     comprehensive MRSA colonization     surveillance program. It is not     intended to diagnose MRSA     infection nor to guide or     monitor treatment for     MRSA infections.  CULTURE, BLOOD (ROUTINE X 2)     Status: None   Collection Time    05/23/14 12:45 AM      Result Value Ref Range Status   Specimen Description BLOOD LEFT ARM   Final   Special Requests BOTTLES DRAWN AEROBIC ONLY 5CC   Final   Culture  Setup Time      Final   Value: 05/23/2014 08:36     Performed at [facility]   Culture     Final   Value: NO GROWTH 5  DAYS     Performed at [facility]   Report Status 05/29/2014 FINAL   Final  CULTURE, BLOOD (ROUTINE X 2)     Status: None   Collection Time    05/23/14 12:55 AM      Result Value Ref Range Status   Specimen Description BLOOD LEFT HAND   Final   Special Requests BOTTLES DRAWN AEROBIC ONLY 10CC   Final   Culture  Setup Time     Final   Value: 05/23/2014 08:36     Performed at [facility]   Culture     Final   Value: NO GROWTH 5 DAYS     Performed at [facility]   Report Status 05/29/2014 FINAL   Final  CULTURE, EXPECTORATED SPUTUM-ASSESSMENT     Status: None   Collection Time    05/25/14  3:32 PM      Result Value Ref Range Status   Specimen Description SPUTUM   Final   Special Requests NONE   Final   Sputum evaluation     Final   Value: THIS SPECIMEN IS ACCEPTABLE. RESPIRATORY CULTURE REPORT TO FOLLOW.   Report Status 05/25/2014 FINAL   Final  CULTURE, RESPIRATORY (NON-EXPECTORATED)     Status: None   Collection Time    05/25/14  3:32 PM      Result Value Ref Range Status   Specimen Description SPUTUM   Final   Special Requests NONE   Final   Gram Stain     Final   Value: FEW WBC PRESENT, PREDOMINANTLY MONONUCLEAR     RARE SQUAMOUS EPITHELIAL CELLS PRESENT     NO ORGANISMS SEEN     Performed at [facility]   Culture     Final   Value: NORMAL OROPHARYNGEAL FLORA     Performed at [facility]   Report Status 05/27/2014 FINAL   Final     Studies:  Recent x-ray studies have been reviewed in detail by the Attending Physician  Scheduled Meds:  Scheduled Meds: . calcium-vitamin D  1 tablet Oral Q breakfast  . carvedilol  3.125 mg Oral BID WC  . digoxin  0.125 mg Oral Daily  . feeding supplement (ENSURE COMPLETE)  237 mL Oral BID BM  . fentaNYL  25 mcg Transdermal Q72H  . gabapentin  600 mg Oral BID  . guaiFENesin   600 mg Oral BID  . ipratropium-albuterol  3 mL Nebulization QID  . isosorbide mononitrate  60 mg Oral Daily  . levofloxacin (LEVAQUIN) IV  750 mg Intravenous Q24H  . levothyroxine  88 mcg Oral QAC breakfast  . lubiprostone  24 mcg Oral BID WC  . mometasone-formoterol  2 puff Inhalation BID  . niacin  500 mg Oral QHS  . predniSONE  40 mg Oral Q breakfast  . saccharomyces boulardii  250 mg Oral BID  . simvastatin  10 mg Oral q1800  . vitamin B-12  1,000 mcg Oral Daily   Continuous Infusions:    Time spent on care of this patient: 35 min   Lasheika Ortloff, MD 06/01/2014, 2:10 PM  LOS: 10 days   [facility] Office  [phone] Pager - Text Page per [email or web address]  If 7PM-7AM, please contact night-coverage [email or web address]

## 2014-06-01 NOTE — Progress Notes (Signed)
Clinical Social Work Department CLINICAL SOCIAL WORK PLACEMENT NOTE 06/01/2014  Patient:  [name]  Account Number:  [id number] Admit date:  05/22/2014  Clinical Social Worker:  Derenda FennelBASHIRA NIXON, CLINICAL SOCIAL WORKER  Date/time:  05/24/2014 04:00 PM  Clinical Social Work is seeking post-discharge placement for this patient at the following level of care:   SKILLED NURSING   (*CSW will update this form in Epic as items are completed)   05/24/2014  Patient/family provided with [facility] of [facility] Work's list of facilities offering this level of care within the geographic area requested by the patient (or if unable, by the patient's family).  05/24/2014  Patient/family informed of their freedom to choose among providers that offer the needed level of care, that participate in Medicare, Medicaid or managed care program needed by the patient, have an available bed and are willing to accept the patient.  05/24/2014  Patient/family informed of [facility]' ownership interest in [facility], as well as of the fact that they are under no obligation to receive care at this facility.  PASARR submitted to EDS on 05/24/2014 PASARR number received on 05/24/2014  FL2 transmitted to all facilities in geographic area requested by pt/family on  05/24/2014 FL2 transmitted to all facilities within larger geographic area on   Patient informed that his/her managed care company has contracts with or will negotiate with  certain facilities, including the following:     Patient/family informed of bed offers received:  05/27/2014 [facility] Physician recommends and patient chooses bed at    Patient to be transferred to  on   Patient to be transferred to facility by  Patient and family notified of transfer on  Name of family member notified:    The following physician request were entered in Epic:   Additional  Comments:  Jandel Patriarca, LCSWA [phone]

## 2014-06-01 NOTE — Progress Notes (Addendum)
CSW (Clinical Child psychotherapistocial Worker) notified that pt may be ready for dc today. CSW paged MD to confirm and request dc summary by 3:00pm. CSW spoke with Travis Bond and notified for potential for dc today.   ADDENDUM: CSW received call from MD notifying that pt will not be ready for dc likely until early next week. CSW updated facility.  Travis Bond, LCSWA [phone]

## 2014-06-02 LAB — PROCALCITONIN: Procalcitonin: <0.1 ng/mL

## 2014-06-02 LAB — PROTIME-INR
INR: 1.46 (ref 0.00–1.49)
Prothrombin Time: 17.7 seconds — ABNORMAL HIGH (ref 11.6–15.2)

## 2014-06-02 NOTE — Progress Notes (Signed)
[facility] Progress Note   Travis SnideGrady Retter [id number] DOB: 1926/11/05 DOA: 05/22/2014 PCP: No primary provider on file.  Brief narrative: Travis Bond is a 80 y.o. male admitted with pneumonia with symptoms of hemoptysis. He was recently treated for CAP 4 wks ago with 7 days. He developed recurrent symptoms about 2 wks ago and now is coughing up blood as well. 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 He is on 2-3 L of O 2 at home for COPD. He reports hemotysis  Two episodes today, improving.    Subjective: Decreased hemoptysis. Overall breathing well.   Assessment/Plan: Principal Problem:   Community acquired pneumonia/ hemoptysis-  severe underlying COPD - second episode of CAP- - urine strep antigen negative , completed the course of Rocephin and Zithromax for 7 days. But patient continues to have hemoptysis and productive sputum. Will add levaquin for 2more days and monitor. Repeat CXR shows improvement of the pneumonia.  -Keep sats greater than 90%.-  - appreciate pulmonary recommendations.  - plan for SNF when medically stable.   Active Problems:  Hemoptysis - appears to decreased in amount  after holding the plavix and coumadin, but still persistent.      COPD - cont O2- chronically on 2.5 L at home - Nebs routinely Q 4 hrs and PRN  Atrial fibrillation - cont Dig, Coreg   CHF- systolic and diastolic- chronic  - compensated - repeat ECHO 45-50 %, grade 2 diastolic dysfunction   Drug eluting stent - CAD - will hold Plavix in view of the hemoptysis.   Nose bleed - on 9/26- resolved  AOCD - Hb stable  Code Status: Full code Family Communication: none at bedside.  Disposition Plan: SNF when stable.  DVT prophylaxis: SCDs while off of anticoagulation  Consultants: cardiology Procedures: none  Antibiotics: Anti-infectives   Start     Dose/Rate Route Frequency Ordered Stop   05/31/14 1530   levofloxacin (LEVAQUIN) IVPB 750 mg     750 mg 100 mL/hr over 90 Minutes Intravenous Every 24 hours 05/31/14 1526 06/02/14 1837   05/23/14 2200  azithromycin (ZITHROMAX) tablet 500 mg     500 mg Oral Every 24 hours 05/22/14 2341 05/29/14 2112   05/23/14 2200  cefTRIAXone (ROCEPHIN) 1 g in dextrose 5 % 50 mL IVPB     1 g 100 mL/hr over 30 Minutes Intravenous Every 24 hours 05/22/14 2341 05/29/14 2142   05/23/14 0000  cefTRIAXone (ROCEPHIN) 1 g in dextrose 5 % 50 mL IVPB  Status:  Discontinued     1 g 100 mL/hr over 30 Minutes Intravenous Every 24 hours 05/22/14 2340 05/22/14 2341   05/22/14 2315  cefTRIAXone (ROCEPHIN) 1 g in dextrose 5 % 50 mL IVPB  Status:  Discontinued     1 g 100 mL/hr over 30 Minutes Intravenous Every 24 hours 05/22/14 2300 05/22/14 2339   05/22/14 2315  azithromycin (ZITHROMAX) tablet 500 mg  Status:  Discontinued     500 mg Oral Every 24 hours 05/22/14 2300 05/22/14 2340   05/22/14 2200  cefTRIAXone (ROCEPHIN) 1 g in dextrose 5 % 50 mL IVPB  Status:  Discontinued     1 g 100 mL/hr over 30 Minutes Intravenous Every 24 hours 05/22/14 2146 05/22/14 2300   05/22/14 2200  azithromycin (ZITHROMAX) 500 mg in dextrose 5 % 250 mL IVPB     500 mg 250 mL/hr over 60 Minutes Intravenous  Once 05/22/14 2146 05/22/14 2350  Objective: Filed Weights   05/31/14 1610 06/01/14 0531 06/02/14 0500  Weight: 67.223 kg (148 lb 3.2 oz) 68.312 kg (150 lb 9.6 oz) 68 kg (149 lb 14.6 oz)    Intake/Output Summary (Last 24 hours) at 06/02/14 1944 Last data filed at 06/02/14 1847  Gross per 24 hour  Intake   1080 ml  Output    851 ml  Net    229 ml     Vitals Filed Vitals:   06/02/14 1108 06/02/14 1300 06/02/14 1513 06/02/14 1711  BP:  115/59  121/56  Pulse:  77  65  Temp:  97.2 F (36.2 C)    TempSrc:  Oral    Resp:  20    Height:      Weight:      SpO2: 98% 96% 96%     Exam: General: No acute respiratory distress Lungs: wheeze and ronchi bilaterally appears to  have improved.  Cardiovascular: Regular rate and rhythm without murmur gallop or rub normal S1 and S2 Abdomen: Nontender, nondistended, soft, bowel sounds positive, no rebound, no ascites, no appreciable mass Extremities: No significant cyanosis, clubbing, or edema bilateral lower extremities  Data Reviewed: Basic Metabolic Panel:  Recent Labs Lab 05/31/14 0535  NA 139  K 4.6  CL 100  CO2 30  GLUCOSE 131*  BUN 25*  CREATININE 0.80  CALCIUM 8.8   Liver Function Tests: No results found for this basename: AST, ALT, ALKPHOS, BILITOT, PROT, ALBUMIN,  in the last 168 hours No results found for this basename: LIPASE, AMYLASE,  in the last 168 hours No results found for this basename: AMMONIA,  in the last 168 hours CBC:  Recent Labs Lab 05/29/14 0440 05/29/14 0900 05/31/14 0535  WBC 4.4 5.8 7.3  HGB 7.9* 8.2* 8.0*  HCT 25.1* 26.1* 25.2*  MCV 100.4* 104.0* 103.3*  PLT 174 181 185   Cardiac Enzymes: No results found for this basename: CKTOTAL, CKMB, CKMBINDEX, TROPONINI,  in the last 168 hours BNP (last 3 results)  Recent Labs  05/29/14 1230  PROBNP 1155.0*   CBG: No results found for this basename: GLUCAP,  in the last 168 hours  Recent Results (from the past 240 hour(s))  CULTURE, EXPECTORATED SPUTUM-ASSESSMENT     Status: None   Collection Time    05/25/14  3:32 PM      Result Value Ref Range Status   Specimen Description SPUTUM   Final   Special Requests NONE   Final   Sputum evaluation     Final   Value: THIS SPECIMEN IS ACCEPTABLE. RESPIRATORY CULTURE REPORT TO FOLLOW.   Report Status 05/25/2014 FINAL   Final  CULTURE, RESPIRATORY (NON-EXPECTORATED)     Status: None   Collection Time    05/25/14  3:32 PM      Result Value Ref Range Status   Specimen Description SPUTUM   Final   Special Requests NONE   Final   Gram Stain     Final   Value: FEW WBC PRESENT, PREDOMINANTLY MONONUCLEAR     RARE SQUAMOUS EPITHELIAL CELLS PRESENT     NO ORGANISMS SEEN      Performed at [facility]   Culture     Final   Value: NORMAL OROPHARYNGEAL FLORA     Performed at [facility]   Report Status 05/27/2014 FINAL   Final     Studies:  Recent x-ray studies have been reviewed in detail by the Attending Physician  Scheduled Meds:  Scheduled  Meds: . calcium-vitamin D  1 tablet Oral Q breakfast  . carvedilol  3.125 mg Oral BID WC  . digoxin  0.125 mg Oral Daily  . feeding supplement (ENSURE COMPLETE)  237 mL Oral BID BM  . fentaNYL  25 mcg Transdermal Q72H  . gabapentin  600 mg Oral BID  . guaiFENesin  600 mg Oral BID  . ipratropium-albuterol  3 mL Nebulization QID  . isosorbide mononitrate  60 mg Oral Daily  . levothyroxine  88 mcg Oral QAC breakfast  . lubiprostone  24 mcg Oral BID WC  . mometasone-formoterol  2 puff Inhalation BID  . niacin  500 mg Oral QHS  . predniSONE  40 mg Oral Q breakfast  . saccharomyces boulardii  250 mg Oral BID  . simvastatin  10 mg Oral q1800  . vitamin B-12  1,000 mcg Oral Daily   Continuous Infusions:    Time spent on care of this patient: 35 min   Vinnie Bobst, MD 06/02/2014, 7:44 PM  LOS: 11 days   [facility] Office  [phone] Pager - Text Page per [email or web address]  If 7PM-7AM, please contact night-coverage [email or web address]

## 2014-06-03 LAB — CBC
HCT: 28.9 % — ABNORMAL LOW (ref 39.0–52.0)
Hemoglobin: 9.1 g/dL — ABNORMAL LOW (ref 13.0–17.0)
MCH: 32.3 pg (ref 26.0–34.0)
MCHC: 31.5 g/dL (ref 30.0–36.0)
MCV: 102.5 fL — ABNORMAL HIGH (ref 78.0–100.0)
PLATELETS: 214 10*3/uL (ref 150–400)
RBC: 2.82 MIL/uL — ABNORMAL LOW (ref 4.22–5.81)
RDW: 18.3 % — ABNORMAL HIGH (ref 11.5–15.5)
WBC: 11.2 10*3/uL — AB (ref 4.0–10.5)

## 2014-06-03 LAB — PROTIME-INR
INR: 1.37 (ref 0.00–1.49)
Prothrombin Time: 16.9 seconds — ABNORMAL HIGH (ref 11.6–15.2)

## 2014-06-03 LAB — GLUCOSE, CAPILLARY: GLUCOSE-CAPILLARY: 117 mg/dL — AB (ref 70–99)

## 2014-06-03 NOTE — Progress Notes (Signed)
[facility] Progress Note   Travis Bond ZOX:[id number] DOB: Jun 23, 1927 DOA: 05/22/2014 PCP: No primary provider on file.  Brief narrative: Travis Bond is a 80 y.o. male admitted with pneumonia with symptoms of hemoptysis. He was recently treated for CAP 4 wks ago with 7 days. He developed recurrent symptoms about 2 wks ago and now is coughing up blood as well. 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 He is on 2-3 L of O 2 at home for COPD. Hemoptysis improving.   Subjective: Decreased hemoptysis. Overall breathing well.   Assessment/Plan: Principal Problem:   Community acquired pneumonia/ hemoptysis-  severe underlying COPD - second episode of CAP- - urine strep antigen negative , completed the course of Rocephin and Zithromax for 7 days. But patient continues to have hemoptysis and productive sputum. Will add levaquin for days and monitor. Repeat CXR shows improvement of the pneumonia.  -Keep sats greater than 90%.-  - appreciate pulmonary recommendations.  - plan for SNF when medically stable.   Active Problems:  Hemoptysis - appears to decreased in amount  after holding the plavix and coumadin, but still persistent.      COPD - cont O2- chronically on 2.5 L at home - Nebs routinely Q 4 hrs and PRN  Atrial fibrillation - cont Dig, Coreg   CHF- systolic and diastolic- chronic  - compensated - repeat ECHO 45-50 %, grade 2 diastolic dysfunction   Drug eluting stent - CAD - will hold Plavix in view of the hemoptysis.   Nose bleed - on 9/26- resolved  AOCD - Hb stable  Code Status: Full code Family Communication: none at bedside.  Disposition Plan: SNF when stable.  DVT prophylaxis: SCDs while off of anticoagulation  Consultants: cardiology Procedures: none  Antibiotics: Anti-infectives   Start     Dose/Rate Route Frequency Ordered Stop   05/31/14 1530  levofloxacin (LEVAQUIN) IVPB 750 mg      750 mg 100 mL/hr over 90 Minutes Intravenous Every 24 hours 05/31/14 1526 06/02/14 1837   05/23/14 2200  azithromycin (ZITHROMAX) tablet 500 mg     500 mg Oral Every 24 hours 05/22/14 2341 05/29/14 2112   05/23/14 2200  cefTRIAXone (ROCEPHIN) 1 g in dextrose 5 % 50 mL IVPB     1 g 100 mL/hr over 30 Minutes Intravenous Every 24 hours 05/22/14 2341 05/29/14 2142   05/23/14 0000  cefTRIAXone (ROCEPHIN) 1 g in dextrose 5 % 50 mL IVPB  Status:  Discontinued     1 g 100 mL/hr over 30 Minutes Intravenous Every 24 hours 05/22/14 2340 05/22/14 2341   05/22/14 2315  cefTRIAXone (ROCEPHIN) 1 g in dextrose 5 % 50 mL IVPB  Status:  Discontinued     1 g 100 mL/hr over 30 Minutes Intravenous Every 24 hours 05/22/14 2300 05/22/14 2339   05/22/14 2315  azithromycin (ZITHROMAX) tablet 500 mg  Status:  Discontinued     500 mg Oral Every 24 hours 05/22/14 2300 05/22/14 2340   05/22/14 2200  cefTRIAXone (ROCEPHIN) 1 g in dextrose 5 % 50 mL IVPB  Status:  Discontinued     1 g 100 mL/hr over 30 Minutes Intravenous Every 24 hours 05/22/14 2146 05/22/14 2300   05/22/14 2200  azithromycin (ZITHROMAX) 500 mg in dextrose 5 % 250 mL IVPB     500 mg 250 mL/hr over 60 Minutes Intravenous  Once 05/22/14 2146 05/22/14 2350  Objective: Filed Weights   06/01/14 0531 06/02/14 0500 06/03/14 0529  Weight: 68.312 kg (150 lb 9.6 oz) 68 kg (149 lb 14.6 oz) 69.31 kg (152 lb 12.8 oz)    Intake/Output Summary (Last 24 hours) at 06/03/14 1342 Last data filed at 06/03/14 0540  Gross per 24 hour  Intake    480 ml  Output    976 ml  Net   -496 ml     Vitals Filed Vitals:   06/03/14 0529 06/03/14 0750 06/03/14 0859 06/03/14 1225  BP: 93/50 140/74    Pulse: 68 77    Temp: 98.1 F (36.7 C)     TempSrc: Oral     Resp: 16     Height:      Weight: 69.31 kg (152 lb 12.8 oz)     SpO2: 98%  95% 94%    Exam: General: No acute respiratory distress Lungs: wheeze and ronchi bilaterally appears to have  improved.  Cardiovascular: Regular rate and rhythm without murmur gallop or rub normal S1 and S2 Abdomen: Nontender, nondistended, soft, bowel sounds positive, no rebound, no ascites, no appreciable mass Extremities: No significant cyanosis, clubbing, or edema bilateral lower extremities  Data Reviewed: Basic Metabolic Panel:  Recent Labs Lab 05/31/14 0535  NA 139  K 4.6  CL 100  CO2 30  GLUCOSE 131*  BUN 25*  CREATININE 0.80  CALCIUM 8.8   Liver Function Tests: No results found for this basename: AST, ALT, ALKPHOS, BILITOT, PROT, ALBUMIN,  in the last 168 hours No results found for this basename: LIPASE, AMYLASE,  in the last 168 hours No results found for this basename: AMMONIA,  in the last 168 hours CBC:  Recent Labs Lab 05/29/14 0440 05/29/14 0900 05/31/14 0535 06/03/14 1150  WBC 4.4 5.8 7.3 11.2*  HGB 7.9* 8.2* 8.0* 9.1*  HCT 25.1* 26.1* 25.2* 28.9*  MCV 100.4* 104.0* 103.3* 102.5*  PLT 174 181 185 214   Cardiac Enzymes: No results found for this basename: CKTOTAL, CKMB, CKMBINDEX, TROPONINI,  in the last 168 hours BNP (last 3 results)  Recent Labs  05/29/14 1230  PROBNP 1155.0*   CBG:  Recent Labs Lab 06/02/14 2117  GLUCAP 117*    Recent Results (from the past 240 hour(s))  CULTURE, EXPECTORATED SPUTUM-ASSESSMENT     Status: None   Collection Time    05/25/14  3:32 PM      Result Value Ref Range Status   Specimen Description SPUTUM   Final   Special Requests NONE   Final   Sputum evaluation     Final   Value: THIS SPECIMEN IS ACCEPTABLE. RESPIRATORY CULTURE REPORT TO FOLLOW.   Report Status 05/25/2014 FINAL   Final  CULTURE, RESPIRATORY (NON-EXPECTORATED)     Status: None   Collection Time    05/25/14  3:32 PM      Result Value Ref Range Status   Specimen Description SPUTUM   Final   Special Requests NONE   Final   Gram Stain     Final   Value: FEW WBC PRESENT, PREDOMINANTLY MONONUCLEAR     RARE SQUAMOUS EPITHELIAL CELLS PRESENT     NO  ORGANISMS SEEN     [facility]formed at [facility]   Culture     Final   Value: NORMAL OROPHARYNGEAL FLORA     [facility]formed at [facility]   Report Status 05/27/2014 FINAL   Final     Studies:  Recent x-ray studies have been reviewed in  detail by the Attending Physician  Scheduled Meds:  Scheduled Meds: . calcium-vitamin D  1 tablet Oral Q breakfast  . carvedilol  3.125 mg Oral BID WC  . digoxin  0.125 mg Oral Daily  . feeding supplement (ENSURE COMPLETE)  237 mL Oral BID BM  . fentaNYL  25 mcg Transdermal Q72H  . gabapentin  600 mg Oral BID  . guaiFENesin  600 mg Oral BID  . ipratropium-albuterol  3 mL Nebulization QID  . isosorbide mononitrate  60 mg Oral Daily  . levothyroxine  88 mcg Oral QAC breakfast  . lubiprostone  24 mcg Oral BID WC  . mometasone-formoterol  2 puff Inhalation BID  . niacin  500 mg Oral QHS  . predniSONE  40 mg Oral Q breakfast  . saccharomyces boulardii  250 mg Oral BID  . simvastatin  10 mg Oral q1800  . vitamin B-12  1,000 mcg Oral Daily   Continuous Infusions:    Time spent on care of this patient: 35 min   Travis Hidrogo, MD 06/03/2014, 1:42 PM  LOS: 12 days   [facility] Office  [phone] Pager - Text Page per [email or web address]  If 7PM-7AM, please contact night-coverage [email or web address]

## 2014-06-04 DIAGNOSIS — I482 Chronic atrial fibrillation: Secondary | ICD-10-CM

## 2014-06-04 LAB — EXPECTORATED SPUTUM ASSESSMENT W REFEX TO RESP CULTURE

## 2014-06-04 MED ORDER — ALBUTEROL SULFATE (2.5 MG/3ML) 0.083% IN NEBU: 2.5000 mg | INHALATION_SOLUTION | RESPIRATORY_TRACT | Status: DC | PRN

## 2014-06-04 MED ORDER — CLOPIDOGREL BISULFATE 75 MG PO TABS: 75.0000 mg | ORAL_TABLET | Freq: Every day | ORAL | Status: DC

## 2014-06-04 MED ORDER — WARFARIN SODIUM 3 MG PO TABS: 3.0000 mg | ORAL_TABLET | Freq: Every day | ORAL | Status: DC

## 2014-06-04 MED ORDER — PREDNISONE 20 MG PO TABS: 20.0000 mg | ORAL_TABLET | Freq: Every day | ORAL | Status: DC

## 2014-06-04 MED ORDER — CYANOCOBALAMIN 1000 MCG PO TABS: 1000.0000 ug | ORAL_TABLET | Freq: Every day | ORAL | Status: DC

## 2014-06-04 MED ORDER — IPRATROPIUM-ALBUTEROL 0.5-2.5 (3) MG/3ML IN SOLN: 3.0000 mL | RESPIRATORY_TRACT | Status: DC | PRN

## 2014-06-04 MED ORDER — ENSURE COMPLETE PO LIQD: 237.0000 mL | Freq: Two times a day (BID) | ORAL | Status: DC

## 2014-06-04 NOTE — Clinical Social Work Placement (Signed)
     Clinical Social Work Department CLINICAL SOCIAL WORK PLACEMENT NOTE 06/04/2014  Patient:  Travis Bond,Travis Bond,Travis Bond  Account Number:  [id number] Admit date:  05/22/2014  Clinical Social Worker:  Derenda FennelBASHIRA NIXON, CLINICAL SOCIAL WORKER  Date/time:  05/24/2014 04:00 PM  Clinical Social Work is seeking post-discharge placement for this patient at the following level of care:   SKILLED NURSING   (*CSW will update this form in Epic as items are completed)   05/24/2014  Patient/family provided with [facility] of [facility] Social Works list of facilities offering this level of care within the geographic area requested by the patient (or if unable, by the patients family).  05/24/2014  Patient/family informed of their freedom to choose among providers that offer the needed level of care, that participate in Medicare, Medicaid or managed care program needed by the patient, have an available bed and are willing to accept the patient.  05/24/2014  Patient/family informed of [facility] ownership interest in [facility], as well as of the fact that they are under no obligation to receive care at this facility.  PASARR submitted to EDS on 05/24/2014 PASARR number received on 05/24/2014  FL2 transmitted to all facilities in geographic area requested by pt/family on  05/24/2014 FL2 transmitted to all facilities within larger geographic area on   Patient informed that his/her managed care company has contracts with or will negotiate with  certain facilities, including the following:   NA     Patient/family informed of bed offers received:  05/27/2014 [facility] Physician recommends and patient chooses bed at    [facility]  06/04/2014 Patient to be transferred to facility by Ambulance  [facility]) Patient and family notified of transfer on 06/04/2014 Name of family member  notified:  Myrtie Somanenee Hindson- daughter  The following physician request were entered in Epic: Physician Request  Please sign FL2.  Please prepare priority discharge summary and prescriptions.    Additional Comments: 06/04/14  OK per MD for d/c today to SNF for continued care. Patient and daughter are agreeable to d/c with no concerns or questions verbalized.  Nursing notified to call report. CSW signing off. Lorri Frederickonna T. Camarie Mctigue, LCSW  [phone]

## 2014-06-04 NOTE — Progress Notes (Signed)
PT Cancellation Note  Patient Details Name: Samara SnideGrady Wallen MRN: [id number] DOB: Aug 28, 1927   Cancelled Treatment:    Reason Eval/Treat Not Completed: Patient declined, no reason specified. Pt firmly declined therapy today, may d/c to rehab later today per his reports. Will check back this afternoon as time allows.    Areeb Corron, [place] 06/04/2014, 12:09 PM

## 2014-06-04 NOTE — Discharge Summary (Signed)
Physician Discharge Summary  Travis Bond ZOX:[id number] DOB: 02-01-1927 DOA: 05/22/2014  PCP: No primary provider on file.  Admit date: 05/22/2014 Discharge date: 06/04/2014  Time spent: 30 minutes  Recommendations for Outpatient Follow-up:  1. Follow up with PCP in 2 to 4weeks 2. Follow up with cardiology Dr Donnie Aho in 1 to 2 weeks.   Discharge Diagnoses:  Principal Problem:   Community acquired pneumonia Active Problems:   C O P D   CAD (coronary artery disease)   Atrial fibrillation   Hyperlipidemia   Drug eluting stent    CAP (community acquired pneumonia)   Protein-calorie malnutrition   Pneumonia   Emphysema lung   Hemoptysis   COPD exacerbation   Discharge Condition: improved.    Diet recommendation: low sodium diet.   Filed Weights   06/02/14 0500 06/03/14 0529 06/04/14 0544  Weight: 68 kg (149 lb 14.6 oz) 69.31 kg (152 lb 12.8 oz) 69.718 kg (153 lb 11.2 oz)    History of present illness:  Travis Bond is a 80 y.o. male admitted with pneumonia with symptoms of hemoptysis. He was recently treated for CAP 4 wks ago with 7 days. He developed recurrent symptoms about 2 wks ago and now is coughing up blood as well. CT chest on admission reveals severe fibrosis due to COPD with Possible pneumonia in RLL. He is on 2-3 L of O 2 at home for COPD. Hemoptysis improving.    Hospital Course:  Community acquired pneumonia/ hemoptysis- severe underlying COPD  - second episode of CAP-  - urine strep antigen negative , completed the course of Rocephin and Zithromax for 7 days and 3 days of IV levaquin. Repeat CXR shows improvement of the pneumonia and improvement in the hemoptysis.  -Keep sats greater than 90%.-  - appreciate pulmonary recommendations.  - plan for SNF when medically stable.   Hemoptysis  - appears to decreased in amount after holding the plavix and coumadin, .  COPD  - cont O2- chronically on 2.5 L at home  - Nebs routinely Q 4 hrs and PRN  Atrial  fibrillation  - cont Dig, Coreg . Currently holding the coumadin in view of the hemoptysis. To be restarted as per the discretion of Dr Donnie Aho when hemoptysis is completely resolved.  CHF- systolic and diastolic- chronic  - compensated . - repeat ECHO 45-50 %, grade 2 diastolic dysfunction  Drug eluting stent - CAD  - will hold Plavix in view of the hemoptysis for a few days, to be restarted soon. Discussed with Dr Donnie Aho. Please follow up with Dr Donnie Aho in  1 to 2 weeks. .  Nose bleed  - on 9/26- resolved  AOCD  - Hb stable   Procedures:  none  Consultations:  Cardiology  pulmonary  Discharge Exam: Filed Vitals:   06/04/14 0905  BP: 114/54  Pulse: 80  Temp:   Resp:     General: alert afebrile comfortable Cardiovascular: s1s2 Respiratory: ctab  Discharge Instructions You were cared for by a hospitalist during your hospital stay. If you have any questions about your discharge medications or the care you received while you were in the hospital after you are discharged, you can call the unit and asked to speak with the hospitalist on call if the hospitalist that took care of you is not available. Once you are discharged, your primary care physician will handle any further medical issues. Please note that NO REFILLS for any discharge medications will be authorized once you are discharged, as  it is imperative that you return to your primary care physician (or establish a relationship with a primary care physician if you do not have one) for your aftercare needs so that they can reassess your need for medications and monitor your lab values.  Discharge Instructions   Diet - low sodium heart healthy    Complete by:  As directed      Discharge instructions    Complete by:  As directed   Follow up with Dr Donnie Ahoilley in 1 to 2 weeks.  Follow up with PCP in 2 to 4 weeks.  We have stopped the plavix and coumadin in view of your hemoptysis. Please resume them when the hemoptysis is  resolved.          Current Discharge Medication List    START taking these medications   Details  feeding supplement, ENSURE COMPLETE, (ENSURE COMPLETE) LIQD Take 237 mLs by mouth 2 (two) times daily between meals.    ipratropium-albuterol (DUONEB) 0.5-2.5 (3) MG/3ML SOLN Take 3 mLs by nebulization every 4 (four) hours as needed. Qty: 360 mL    predniSONE (DELTASONE) 20 MG tablet Take 1 tablet (20 mg total) by mouth daily with breakfast. Qty: 3 tablet, Refills: 0    vitamin B-12 1000 MCG tablet Take 1 tablet (1,000 mcg total) by mouth daily.      CONTINUE these medications which have CHANGED   Details  albuterol (PROVENTIL) (2.5 MG/3ML) 0.083% nebulizer solution Take 3 mLs (2.5 mg total) by nebulization every 2 (two) hours as needed for wheezing or shortness of breath. Qty: 75 mL, Refills: 12    clopidogrel (PLAVIX) 75 MG tablet Take 1 tablet (75 mg total) by mouth daily.    warfarin (COUMADIN) 3 MG tablet Take 1-1.5 tablets (3-4.5 mg total) by mouth daily. Take 4.5 on Tuesday ONLY, all other days take 3mg  including Sunday      CONTINUE these medications which have NOT CHANGED   Details  albuterol (PROVENTIL HFA;VENTOLIN HFA) 108 (90 BASE) MCG/ACT inhaler Inhale 2 puffs into the lungs every 6 (six) hours as needed for wheezing or shortness of breath.    ALPRAZolam (XANAX) 0.25 MG tablet Take 0.25 mg by mouth at bedtime as needed for anxiety.    calcium-vitamin D (OSCAL WITH D) 500-200 MG-UNIT per tablet Take 1 tablet by mouth daily with breakfast.    carvedilol (COREG) 3.125 MG tablet Take 3.125 mg by mouth 2 (two) times daily with a meal.     digoxin (LANOXIN) 0.125 MG tablet Take 0.125 mg by mouth daily.    fentaNYL (DURAGESIC - DOSED MCG/HR) 25 MCG/HR patch Place 25 mcg onto the skin every 3 (three) days.    Fluticasone-Salmeterol (ADVAIR) 250-50 MCG/DOSE AEPB Inhale 1 puff into the lungs 2 (two) times daily.    gabapentin (NEURONTIN) 300 MG capsule Take 600 mg by  mouth 2 (two) times daily.    guaiFENesin (MUCINEX) 600 MG 12 hr tablet Take 600 mg by mouth 2 (two) times daily.    isosorbide mononitrate (IMDUR) 60 MG 24 hr tablet Take 60 mg by mouth daily.      levothyroxine (SYNTHROID, LEVOTHROID) 88 MCG tablet Take 88 mcg by mouth daily before breakfast.    lubiprostone (AMITIZA) 24 MCG capsule Take 24 mcg by mouth 2 (two) times daily with a meal.    niacin (NIASPAN) 500 MG CR tablet Take 500 mg by mouth at bedtime.      nitroGLYCERIN (NITROSTAT) 0.4 MG SL tablet Place 0.4 mg  under the tongue every 5 (five) minutes as needed for chest pain.    pravastatin (PRAVACHOL) 20 MG tablet Take 20 mg by mouth daily.     saccharomyces boulardii (FLORASTOR) 250 MG capsule Take 250 mg by mouth 2 (two) times daily.      STOP taking these medications     furosemide (LASIX) 20 MG tablet      ipratropium (ATROVENT) 0.02 % nebulizer solution      moxifloxacin (AVELOX) 400 MG tablet        No Known Allergies    The results of significant diagnostics from this hospitalization (including imaging, microbiology, ancillary and laboratory) are listed below for reference.    Significant Diagnostic Studies: Dg Chest 2 View  05/31/2014   CLINICAL DATA:  COPD, recent admission for community acquired pneumonia, increasing shortness of breath and hemoptysis with slow improvement of symptoms since beginning treatment, past history coronary artery disease, coronary stenting, cardioversion, pulmonary fibrosis, hypertension, former smoker, type 2 diabetes  EXAM: CHEST  2 VIEW  COMPARISON:  05/29/2014  FINDINGS: Normal heart size, mediastinal contours and pulmonary vascularity.  Emphysematous changes consistent with history of COPD.  Skin folds project over chest bilaterally.  Blunting of the RIGHT lateral costophrenic angle.  Bibasilar atelectasis.  Improved LEFT lower lobe infiltrate since previous exam.  No LEFT pleural effusion or evidence of pneumothorax.  Bones  demineralized.  IMPRESSION: COPD changes with bibasilar atelectasis and improved LEFT lower lobe infiltrate.   Electronically Signed   By: Ulyses Southward M.D.   On: 05/31/2014 16:59   Dg Chest 2 View  05/29/2014   CLINICAL DATA:  80 year old male with shortness of breath.  EXAM: CHEST  2 VIEW  COMPARISON:  05/22/2014 chest CT and radiograph. Prior chest radiographs dating back to 08/01/2010.  FINDINGS: Mild cardiomegaly and COPD/emphysema again identified.  Mild airspace opacities in both lower lungs again noted.  A nodular opacity along the lateral right mid long corresponds to the nodule identified on recent CT.  No new abnormalities are identified. There is no evidence of pneumothorax or definite pleural effusion.  IMPRESSION: Unchanged bilateral lower lung airspace opacities with lateral right mid lung nodular opacity now better visualize radiographically.  Mild cardiomegaly and COPD/emphysema.   Electronically Signed   By: Laveda Abbe M.D.   On: 05/29/2014 15:50   Dg Chest 2 View  05/22/2014   CLINICAL DATA:  COPD  EXAM: CHEST  2 VIEW  COMPARISON:  04/25/2014, CT from earlier in the same day.  FINDINGS: Cardiac shadow is stable. Lungs remain hyperinflated. Fibrotic changes are again noted in the mid and lower lung fields bilaterally. No definitive acute infiltrate is seen. The nodular changes seen on the recent chest CT are less well appreciated on this exam.  IMPRESSION: COPD and chronic changes. The recently noted nodular changes on CT are not well appreciated on this exam.   Electronically Signed   By: Alcide Clever M.D.   On: 05/22/2014 20:57   Ct Angio Chest W/cm &/or Wo Cm  05/22/2014   CLINICAL DATA:  Chest pain and shortness of breath getting worse, hemoptysis  EXAM: CT ANGIOGRAPHY CHEST WITH CONTRAST  TECHNIQUE: Multidetector CT imaging of the chest was performed using the standard protocol during bolus administration of intravenous contrast. Multiplanar CT image reconstructions and MIPs were  obtained to evaluate the vascular anatomy.  CONTRAST:  OMNIPAQUE IOHEXOL 350 MG/ML SOLN  COMPARISON:  CT abdomen pelvis 11/14/2012 and CT thorax 11/26/2009  FINDINGS: Severe diffuse  COPD again identified. Peripheral reticular opacities in the mid to lower lung zones bilaterally, similar to prior study suggesting interstitial pulmonary fibrosis. More confluent airspace opacities posteriorly bilaterally suggesting atelectasis. In the superior segment of the right lower lobe there is more confluent reticular nodular infiltrate, with several prominent nodules measuring up to about 15 mm in the superior aspect of the superior segment. This process can 10 use into the central right lower lobe.  There are no filling defects in the pulmonary arterial system. There is calcification of the thoracic aorta. No pleural or pericardial effusion. No significant hilar or mediastinal adenopathy.  Images of the upper abdomen demonstrate numerous bilateral renal lesions measuring between 1 and 4 cm, too numerous to count, not characterized on this study, as contrast has not reached the viscera. There is infrarenal abdominal aortic aneurysm to a diameter of at least 43 x 45 mm.  There is approximately 25% compression deformity of L2. There is an acute appearing vertical fracture line. There is no evidence of significant retropulsion. This is new from 11/14/2012.  Review of the MIP images confirms the above findings.  IMPRESSION: 1. No evidence pulmonary embolism. 2. Superimposed on severe COPD and findings chronic interstitial pulmonary fibrosis, there is reticular nodular infiltrate throughout the right lower lobe particularly in the superior segment. In the superior segment there also more confluent nodular opacities. These findings suggest pneumonia. CT follow-up after appropriate therapy recommended to ensure resolution, particularly of the nodular densities. 3. Known infrarenal abdominal aortic aneurysm 4. Numerous renal  lesions, not fully characterized by this study, but there appearance is not significantly different from 11/14/2012 CT abdomen and pelvis at which time they appeared consistent with cysts. 5. Mild but acute appearing L2 compression deformity.   Electronically Signed   By: Esperanza Heir M.D.   On: 05/22/2014 20:52    Microbiology: Recent Results (from the past 240 hour(s))  CULTURE, EXPECTORATED SPUTUM-ASSESSMENT     Status: None   Collection Time    05/25/14  3:32 PM      Result Value Ref Range Status   Specimen Description SPUTUM   Final   Special Requests NONE   Final   Sputum evaluation     Final   Value: THIS SPECIMEN IS ACCEPTABLE. RESPIRATORY CULTURE REPORT TO FOLLOW.   Report Status 05/25/2014 FINAL   Final  CULTURE, RESPIRATORY (NON-EXPECTORATED)     Status: None   Collection Time    05/25/14  3:32 PM      Result Value Ref Range Status   Specimen Description SPUTUM   Final   Special Requests NONE   Final   Gram Stain     Final   Value: FEW WBC PRESENT, PREDOMINANTLY MONONUCLEAR     RARE SQUAMOUS EPITHELIAL CELLS PRESENT     NO ORGANISMS SEEN     Performed at [facility]   Culture     Final   Value: NORMAL OROPHARYNGEAL FLORA     Performed at [facility]   Report Status 05/27/2014 FINAL   Final     Labs: Basic Metabolic Panel:  Recent Labs Lab 05/31/14 0535  NA 139  K 4.6  CL 100  CO2 30  GLUCOSE 131*  BUN 25*  CREATININE 0.80  CALCIUM 8.8   Liver Function Tests: No results found for this basename: AST, ALT, ALKPHOS, BILITOT, PROT, ALBUMIN,  in the last 168 hours No results found for this basename: LIPASE, AMYLASE,  in the last 168 hours No  results found for this basename: AMMONIA,  in the last 168 hours CBC:  Recent Labs Lab 05/29/14 0440 05/29/14 0900 05/31/14 0535 06/03/14 1150  WBC 4.4 5.8 7.3 11.2*  HGB 7.9* 8.2* 8.0* 9.1*  HCT 25.1* 26.1* 25.2* 28.9*  MCV 100.4* 104.0* 103.3* 102.5*  PLT 174 181 185 214   Cardiac  Enzymes: No results found for this basename: CKTOTAL, CKMB, CKMBINDEX, TROPONINI,  in the last 168 hours BNP: BNP (last 3 results)  Recent Labs  05/29/14 1230  PROBNP 1155.0*   CBG:  Recent Labs Lab 06/02/14 2117  GLUCAP 117*       Signed:  Hayly Litsey  [facility] 06/04/2014, 11:02 AM

## 2014-06-06 LAB — CULTURE, RESPIRATORY W GRAM STAIN: Culture: NORMAL

## 2014-06-06 LAB — CULTURE, RESPIRATORY

## 2014-06-08 ENCOUNTER — Inpatient Hospital Stay (HOSPITAL_COMMUNITY)
Admission: EM | Admit: 2014-06-08 | Discharge: 2014-06-13 | DRG: 193 | Disposition: A | Discharge status: Skilled Nursing Facility | Payer: Medicare Other | Attending: Internal Medicine | Admitting: Internal Medicine

## 2014-06-08 ENCOUNTER — Emergency Department (HOSPITAL_COMMUNITY): Payer: Medicare Other

## 2014-06-08 ENCOUNTER — Telehealth: Payer: Self-pay | Admitting: Pulmonary Disease

## 2014-06-08 ENCOUNTER — Encounter (HOSPITAL_COMMUNITY): Payer: Self-pay | Admitting: Emergency Medicine

## 2014-06-08 DIAGNOSIS — N189 Chronic kidney disease, unspecified: Secondary | ICD-10-CM | POA: Diagnosis present

## 2014-06-08 DIAGNOSIS — R5381 Other malaise: Secondary | ICD-10-CM

## 2014-06-08 DIAGNOSIS — J440 Chronic obstructive pulmonary disease with acute lower respiratory infection: Secondary | ICD-10-CM | POA: Diagnosis present

## 2014-06-08 DIAGNOSIS — G629 Polyneuropathy, unspecified: Secondary | ICD-10-CM

## 2014-06-08 DIAGNOSIS — E119 Type 2 diabetes mellitus without complications: Secondary | ICD-10-CM | POA: Diagnosis present

## 2014-06-08 DIAGNOSIS — J84112 Idiopathic pulmonary fibrosis: Secondary | ICD-10-CM | POA: Diagnosis present

## 2014-06-08 DIAGNOSIS — Z7901 Long term (current) use of anticoagulants: Secondary | ICD-10-CM | POA: Diagnosis not present

## 2014-06-08 DIAGNOSIS — N183 Chronic kidney disease, stage 3 unspecified: Secondary | ICD-10-CM

## 2014-06-08 DIAGNOSIS — M4716 Other spondylosis with myelopathy, lumbar region: Secondary | ICD-10-CM

## 2014-06-08 DIAGNOSIS — Z87891 Personal history of nicotine dependence: Secondary | ICD-10-CM

## 2014-06-08 DIAGNOSIS — J9621 Acute and chronic respiratory failure with hypoxia: Secondary | ICD-10-CM | POA: Diagnosis present

## 2014-06-08 DIAGNOSIS — Z833 Family history of diabetes mellitus: Secondary | ICD-10-CM | POA: Diagnosis not present

## 2014-06-08 DIAGNOSIS — Z5181 Encounter for therapeutic drug level monitoring: Secondary | ICD-10-CM

## 2014-06-08 DIAGNOSIS — E785 Hyperlipidemia, unspecified: Secondary | ICD-10-CM | POA: Diagnosis present

## 2014-06-08 DIAGNOSIS — Z8249 Family history of ischemic heart disease and other diseases of the circulatory system: Secondary | ICD-10-CM | POA: Diagnosis not present

## 2014-06-08 DIAGNOSIS — J9620 Acute and chronic respiratory failure, unspecified whether with hypoxia or hypercapnia: Secondary | ICD-10-CM | POA: Diagnosis present

## 2014-06-08 DIAGNOSIS — I2510 Atherosclerotic heart disease of native coronary artery without angina pectoris: Secondary | ICD-10-CM | POA: Diagnosis present

## 2014-06-08 DIAGNOSIS — R778 Other specified abnormalities of plasma proteins: Secondary | ICD-10-CM

## 2014-06-08 DIAGNOSIS — J8410 Pulmonary fibrosis, unspecified: Secondary | ICD-10-CM

## 2014-06-08 DIAGNOSIS — Z7952 Long term (current) use of systemic steroids: Secondary | ICD-10-CM

## 2014-06-08 DIAGNOSIS — R7989 Other specified abnormal findings of blood chemistry: Secondary | ICD-10-CM

## 2014-06-08 DIAGNOSIS — M545 Low back pain, unspecified: Secondary | ICD-10-CM

## 2014-06-08 DIAGNOSIS — J189 Pneumonia, unspecified organism: Secondary | ICD-10-CM | POA: Diagnosis present

## 2014-06-08 DIAGNOSIS — J9611 Chronic respiratory failure with hypoxia: Secondary | ICD-10-CM

## 2014-06-08 DIAGNOSIS — I739 Peripheral vascular disease, unspecified: Secondary | ICD-10-CM | POA: Diagnosis present

## 2014-06-08 DIAGNOSIS — D539 Nutritional anemia, unspecified: Secondary | ICD-10-CM | POA: Diagnosis present

## 2014-06-08 DIAGNOSIS — E46 Unspecified protein-calorie malnutrition: Secondary | ICD-10-CM

## 2014-06-08 DIAGNOSIS — J441 Chronic obstructive pulmonary disease with (acute) exacerbation: Secondary | ICD-10-CM

## 2014-06-08 DIAGNOSIS — G609 Hereditary and idiopathic neuropathy, unspecified: Secondary | ICD-10-CM | POA: Diagnosis present

## 2014-06-08 DIAGNOSIS — R0602 Shortness of breath: Secondary | ICD-10-CM | POA: Diagnosis present

## 2014-06-08 DIAGNOSIS — I4891 Unspecified atrial fibrillation: Secondary | ICD-10-CM | POA: Diagnosis present

## 2014-06-08 DIAGNOSIS — F419 Anxiety disorder, unspecified: Secondary | ICD-10-CM

## 2014-06-08 DIAGNOSIS — R042 Hemoptysis: Secondary | ICD-10-CM | POA: Diagnosis present

## 2014-06-08 DIAGNOSIS — I350 Nonrheumatic aortic (valve) stenosis: Secondary | ICD-10-CM

## 2014-06-08 DIAGNOSIS — I340 Nonrheumatic mitral (valve) insufficiency: Secondary | ICD-10-CM

## 2014-06-08 DIAGNOSIS — I714 Abdominal aortic aneurysm, without rupture, unspecified: Secondary | ICD-10-CM

## 2014-06-08 DIAGNOSIS — R634 Abnormal weight loss: Secondary | ICD-10-CM

## 2014-06-08 DIAGNOSIS — I129 Hypertensive chronic kidney disease with stage 1 through stage 4 chronic kidney disease, or unspecified chronic kidney disease: Secondary | ICD-10-CM | POA: Diagnosis present

## 2014-06-08 DIAGNOSIS — Z955 Presence of coronary angioplasty implant and graft: Secondary | ICD-10-CM

## 2014-06-08 DIAGNOSIS — Y95 Nosocomial condition: Secondary | ICD-10-CM | POA: Diagnosis present

## 2014-06-08 DIAGNOSIS — E039 Hypothyroidism, unspecified: Secondary | ICD-10-CM | POA: Diagnosis present

## 2014-06-08 DIAGNOSIS — G8929 Other chronic pain: Secondary | ICD-10-CM

## 2014-06-08 DIAGNOSIS — J439 Emphysema, unspecified: Secondary | ICD-10-CM | POA: Diagnosis present

## 2014-06-08 DIAGNOSIS — R609 Edema, unspecified: Secondary | ICD-10-CM

## 2014-06-08 DIAGNOSIS — I119 Hypertensive heart disease without heart failure: Secondary | ICD-10-CM

## 2014-06-08 LAB — COMPREHENSIVE METABOLIC PANEL
ALBUMIN: 3.4 g/dL — AB (ref 3.5–5.2)
ALK PHOS: 75 U/L (ref 39–117)
ALT: 32 U/L (ref 0–53)
ANION GAP: 10 (ref 5–15)
AST: 27 U/L (ref 0–37)
BILIRUBIN TOTAL: 1 mg/dL (ref 0.3–1.2)
BUN: 19 mg/dL (ref 6–23)
CHLORIDE: 97 meq/L (ref 96–112)
CO2: 31 meq/L (ref 19–32)
CREATININE: 0.77 mg/dL (ref 0.50–1.35)
Calcium: 8.5 mg/dL (ref 8.4–10.5)
GFR calc Af Amer: >90 mL/min (ref >90)
GFR calc non Af Amer: 81 mL/min — ABNORMAL LOW (ref >90)
Glucose, Bld: 181 mg/dL — ABNORMAL HIGH (ref 70–99)
POTASSIUM: 4.5 meq/L (ref 3.7–5.3)
Sodium: 138 mEq/L (ref 137–147)
Total Protein: 6.8 g/dL (ref 6.0–8.3)

## 2014-06-08 LAB — I-STAT ARTERIAL BLOOD GAS, ED
ACID-BASE EXCESS: 6 mmol/L — AB (ref 0.0–2.0)
Bicarbonate: 32.4 mEq/L — ABNORMAL HIGH (ref 20.0–24.0)
O2 SAT: 92 %
TCO2: 34 mmol/L (ref 0–100)
pCO2 arterial: 53.3 mmHg — ABNORMAL HIGH (ref 35.0–45.0)
pH, Arterial: 7.392 (ref 7.350–7.450)
pO2, Arterial: 66 mmHg — ABNORMAL LOW (ref 80.0–100.0)

## 2014-06-08 LAB — I-STAT TROPONIN, ED: Troponin i, poc: 0.03 ng/mL (ref 0.00–0.08)

## 2014-06-08 LAB — I-STAT CG4 LACTIC ACID, ED: LACTIC ACID, VENOUS: 1.19 mmol/L (ref 0.5–2.2)

## 2014-06-08 LAB — CBC
HEMATOCRIT: 32 % — AB (ref 39.0–52.0)
HEMOGLOBIN: 10 g/dL — AB (ref 13.0–17.0)
MCH: 32.5 pg (ref 26.0–34.0)
MCHC: 31.3 g/dL (ref 30.0–36.0)
MCV: 103.9 fL — AB (ref 78.0–100.0)
Platelets: 174 10*3/uL (ref 150–400)
RBC: 3.08 MIL/uL — ABNORMAL LOW (ref 4.22–5.81)
RDW: 19.5 % — AB (ref 11.5–15.5)
WBC: 11.7 10*3/uL — AB (ref 4.0–10.5)

## 2014-06-08 LAB — PROTIME-INR
INR: 1.17 (ref 0.00–1.49)
PROTHROMBIN TIME: 14.9 s (ref 11.6–15.2)

## 2014-06-08 LAB — TROPONIN I
Troponin I: <0.3 ng/mL (ref <0.30)
Troponin I: <0.3 ng/mL (ref <0.30)

## 2014-06-08 LAB — PRO B NATRIURETIC PEPTIDE: PRO B NATRI PEPTIDE: 2053 pg/mL — AB (ref 0–450)

## 2014-06-08 LAB — MRSA PCR SCREENING: MRSA by PCR: NEGATIVE

## 2014-06-08 MED ORDER — PRAVASTATIN SODIUM 20 MG PO TABS
20.0000 mg | ORAL_TABLET | Freq: Every day | ORAL | Status: DC
  Administered 2014-06-08 – 2014-06-13 (×6): 20 mg via ORAL
  Filled 2014-06-08 (×6): qty 1

## 2014-06-08 MED ORDER — ISOSORBIDE MONONITRATE ER 60 MG PO TB24
60.0000 mg | ORAL_TABLET | Freq: Every day | ORAL | Status: DC
  Administered 2014-06-09 – 2014-06-13 (×5): 60 mg via ORAL
  Filled 2014-06-08 (×5): qty 1

## 2014-06-08 MED ORDER — HYDROCODONE-ACETAMINOPHEN 5-325 MG PO TABS
1.0000 | ORAL_TABLET | ORAL | Status: DC | PRN
  Administered 2014-06-08 – 2014-06-09 (×2): 2 via ORAL
  Filled 2014-06-08 (×2): qty 2

## 2014-06-08 MED ORDER — GABAPENTIN 300 MG PO CAPS
600.0000 mg | ORAL_CAPSULE | Freq: Two times a day (BID) | ORAL | Status: DC
  Filled 2014-06-08: qty 2

## 2014-06-08 MED ORDER — ONDANSETRON HCL 4 MG/2ML IJ SOLN: 4.0000 mg | Freq: Four times a day (QID) | INTRAMUSCULAR | Status: DC | PRN

## 2014-06-08 MED ORDER — ALPRAZOLAM 0.25 MG PO TABS
0.2500 mg | ORAL_TABLET | Freq: Every evening | ORAL | Status: DC | PRN
  Administered 2014-06-08: 0.25 mg via ORAL
  Filled 2014-06-08: qty 1

## 2014-06-08 MED ORDER — FENTANYL 25 MCG/HR TD PT72
25.0000 ug | MEDICATED_PATCH | TRANSDERMAL | Status: DC
  Administered 2014-06-10 – 2014-06-13 (×2): 25 ug via TRANSDERMAL
  Filled 2014-06-08 (×2): qty 1

## 2014-06-08 MED ORDER — LEVALBUTEROL HCL 1.25 MG/0.5ML IN NEBU
1.2500 mg | INHALATION_SOLUTION | Freq: Once | RESPIRATORY_TRACT | Status: AC
  Administered 2014-06-08: 1.25 mg via RESPIRATORY_TRACT
  Filled 2014-06-08: qty 0.5

## 2014-06-08 MED ORDER — LUBIPROSTONE 24 MCG PO CAPS
24.0000 ug | ORAL_CAPSULE | Freq: Two times a day (BID) | ORAL | Status: DC
  Administered 2014-06-09 – 2014-06-13 (×8): 24 ug via ORAL
  Filled 2014-06-08 (×13): qty 1

## 2014-06-08 MED ORDER — DILTIAZEM HCL 25 MG/5ML IV SOLN
5.0000 mg | Freq: Once | INTRAVENOUS | Status: AC
  Administered 2014-06-08: 5 mg via INTRAVENOUS
  Filled 2014-06-08: qty 5

## 2014-06-08 MED ORDER — CARVEDILOL 3.125 MG PO TABS
3.1250 mg | ORAL_TABLET | Freq: Two times a day (BID) | ORAL | Status: DC
  Administered 2014-06-08 – 2014-06-13 (×10): 3.125 mg via ORAL
  Filled 2014-06-08 (×13): qty 1

## 2014-06-08 MED ORDER — MORPHINE SULFATE 2 MG/ML IJ SOLN: 1.0000 mg | INTRAMUSCULAR | Status: DC | PRN

## 2014-06-08 MED ORDER — METOPROLOL TARTRATE 1 MG/ML IV SOLN: 5.0000 mg | Freq: Once | INTRAVENOUS | Status: DC

## 2014-06-08 MED ORDER — METOPROLOL TARTRATE 1 MG/ML IV SOLN: 5.0000 mg | INTRAVENOUS | Status: DC | PRN

## 2014-06-08 MED ORDER — ACETAMINOPHEN 650 MG RE SUPP: 650.0000 mg | Freq: Four times a day (QID) | RECTAL | Status: DC | PRN

## 2014-06-08 MED ORDER — SODIUM CHLORIDE 0.9 % IV SOLN
1500.0000 mg | Freq: Once | INTRAVENOUS | Status: DC
Start: 2014-06-08 — End: 2014-06-08
  Filled 2014-06-08: qty 1500

## 2014-06-08 MED ORDER — GABAPENTIN 600 MG PO TABS
600.0000 mg | ORAL_TABLET | Freq: Two times a day (BID) | ORAL | Status: DC
  Administered 2014-06-08 – 2014-06-13 (×10): 600 mg via ORAL
  Filled 2014-06-08 (×12): qty 1

## 2014-06-08 MED ORDER — NIACIN ER (ANTIHYPERLIPIDEMIC) 500 MG PO TBCR
500.0000 mg | EXTENDED_RELEASE_TABLET | Freq: Every day | ORAL | Status: DC
  Administered 2014-06-08 – 2014-06-13 (×5): 500 mg via ORAL
  Filled 2014-06-08 (×7): qty 1

## 2014-06-08 MED ORDER — GUAIFENESIN ER 600 MG PO TB12
600.0000 mg | ORAL_TABLET | Freq: Two times a day (BID) | ORAL | Status: DC
  Administered 2014-06-08 – 2014-06-13 (×10): 600 mg via ORAL
  Filled 2014-06-08 (×12): qty 1

## 2014-06-08 MED ORDER — VANCOMYCIN HCL 10 G IV SOLR
1250.0000 mg | Freq: Once | INTRAVENOUS | Status: AC
  Administered 2014-06-08: 1250 mg via INTRAVENOUS
  Filled 2014-06-08: qty 1250

## 2014-06-08 MED ORDER — DIGOXIN 125 MCG PO TABS
0.1250 mg | ORAL_TABLET | Freq: Every day | ORAL | Status: DC
  Administered 2014-06-08 – 2014-06-13 (×6): 0.125 mg via ORAL
  Filled 2014-06-08 (×6): qty 1

## 2014-06-08 MED ORDER — IPRATROPIUM BROMIDE 0.02 % IN SOLN
0.5000 mg | Freq: Four times a day (QID) | RESPIRATORY_TRACT | Status: DC
  Administered 2014-06-08 – 2014-06-13 (×18): 0.5 mg via RESPIRATORY_TRACT
  Filled 2014-06-08 (×19): qty 2.5

## 2014-06-08 MED ORDER — LEVOTHYROXINE SODIUM 88 MCG PO TABS
88.0000 ug | ORAL_TABLET | Freq: Every day | ORAL | Status: DC
  Administered 2014-06-09 – 2014-06-13 (×5): 88 ug via ORAL
  Filled 2014-06-08 (×7): qty 1

## 2014-06-08 MED ORDER — VANCOMYCIN HCL IN DEXTROSE 750-5 MG/150ML-% IV SOLN
750.0000 mg | Freq: Two times a day (BID) | INTRAVENOUS | Status: DC
  Administered 2014-06-09 – 2014-06-11 (×5): 750 mg via INTRAVENOUS
  Filled 2014-06-08 (×8): qty 150

## 2014-06-08 MED ORDER — IOHEXOL 350 MG/ML SOLN
60.0000 mL | Freq: Once | INTRAVENOUS | Status: AC | PRN
  Administered 2014-06-08: 60 mL via INTRAVENOUS

## 2014-06-08 MED ORDER — ALUM & MAG HYDROXIDE-SIMETH 200-200-20 MG/5ML PO SUSP: 30.0000 mL | Freq: Four times a day (QID) | ORAL | Status: DC | PRN

## 2014-06-08 MED ORDER — MOMETASONE FURO-FORMOTEROL FUM 100-5 MCG/ACT IN AERO
2.0000 | INHALATION_SPRAY | Freq: Two times a day (BID) | RESPIRATORY_TRACT | Status: DC
Start: 2014-06-08 — End: 2014-06-13
  Administered 2014-06-08 – 2014-06-13 (×10): 2 via RESPIRATORY_TRACT
  Filled 2014-06-08: qty 8.8

## 2014-06-08 MED ORDER — SENNOSIDES-DOCUSATE SODIUM 8.6-50 MG PO TABS
1.0000 | ORAL_TABLET | Freq: Every evening | ORAL | Status: DC | PRN
  Filled 2014-06-08: qty 1

## 2014-06-08 MED ORDER — DOCUSATE SODIUM 100 MG PO CAPS
100.0000 mg | ORAL_CAPSULE | Freq: Two times a day (BID) | ORAL | Status: DC
Start: 2014-06-08 — End: 2014-06-13
  Administered 2014-06-08 – 2014-06-13 (×9): 100 mg via ORAL
  Filled 2014-06-08 (×11): qty 1

## 2014-06-08 MED ORDER — PREDNISONE 20 MG PO TABS
20.0000 mg | ORAL_TABLET | Freq: Every day | ORAL | Status: DC
  Administered 2014-06-09: 20 mg via ORAL
  Filled 2014-06-08 (×2): qty 1

## 2014-06-08 MED ORDER — ONDANSETRON HCL 4 MG PO TABS
4.0000 mg | ORAL_TABLET | Freq: Four times a day (QID) | ORAL | Status: DC | PRN
Start: 2014-06-08 — End: 2014-06-13

## 2014-06-08 MED ORDER — LEVALBUTEROL HCL 0.63 MG/3ML IN NEBU
0.6300 mg | INHALATION_SOLUTION | RESPIRATORY_TRACT | Status: DC
  Administered 2014-06-08 – 2014-06-09 (×4): 0.63 mg via RESPIRATORY_TRACT
  Filled 2014-06-08 (×9): qty 3

## 2014-06-08 MED ORDER — SODIUM CHLORIDE 0.9 % IV SOLN
INTRAVENOUS | Status: DC
  Administered 2014-06-12: 08:00:00 via INTRAVENOUS

## 2014-06-08 MED ORDER — NITROGLYCERIN 0.4 MG SL SUBL: 0.4000 mg | SUBLINGUAL_TABLET | SUBLINGUAL | Status: DC | PRN

## 2014-06-08 MED ORDER — ACETAMINOPHEN 325 MG PO TABS: 650.0000 mg | ORAL_TABLET | Freq: Four times a day (QID) | ORAL | Status: DC | PRN

## 2014-06-08 MED ORDER — DEXTROSE 5 % IV SOLN
1.0000 g | Freq: Three times a day (TID) | INTRAVENOUS | Status: DC
  Administered 2014-06-08 – 2014-06-12 (×11): 1 g via INTRAVENOUS
  Filled 2014-06-08 (×16): qty 1

## 2014-06-08 MED ORDER — SACCHAROMYCES BOULARDII 250 MG PO CAPS
250.0000 mg | ORAL_CAPSULE | Freq: Two times a day (BID) | ORAL | Status: DC
  Administered 2014-06-08 – 2014-06-13 (×10): 250 mg via ORAL
  Filled 2014-06-08 (×12): qty 1

## 2014-06-08 NOTE — H&P (Addendum)
[facility] History and Physical  Travis SnideGrady Grinnell ZOX:[id number] DOB: Jan 24, 1927 DOA: 06/08/2014  Referring physician: EDP PCP: No primary provider on file.   Chief Complaint: shortness of breath  HPI: Travis Bond is a 80 y.o. male  With h/o recent admission for CAP and hemoptysis, atrial fibrillation (plavix and warfarin held for hemoptysis), chronic respiratory failure, copd, pulmonary fibrosis, CAD with stent presents to ED with worsening dyspnea. Has had continued hemoptysis, about a tablespoon at a time, as well as purulent sputum. In ED, HR 150, atrial fib.  Also, sharp central chest pain alleviated by lying on right side. Usually on 2.5 - 3 liters oxygen, ED staff increased to 6 liters.  After 5 mg iv cardizem, HR about 110.  CTA chest shows no PE, but does show worsening infiltrate in RUL and bilateral lower lobes.  Denies fever, chills. Discharge from hospitalist service 10/5 to SNF.  proBNP 2000 (1000) last admission.  Speech therapy assessed swallow function at bedside at SNF, and no reported problems.  Patient has had pneumonia earlier this year as well   Review of Systems:  Systems reviewed. As above, otherwise negative.   Past Medical History  Diagnosis Date  . COPD (chronic obstructive pulmonary disease)   . Chronic respiratory failure with hypoxia   . Pulmonary fibrosis   . Coronary artery disease   . Hypertension   . Hyperlipidemia   . Carotid stenosis   . Peripheral vascular disease   . Anemia   . Peripheral neuropathy   . B12 deficiency   . Pneumonia   . Atrial fibrillation   . Hypothyroidism   . Anxiety state, unspecified   . Unspecified deficiency anemia   . Abdominal aneurysm without mention of rupture   . Aortic valve disorders   . Type II or unspecified type diabetes mellitus without mention of complication, not stated as uncontrolled   . Unspecified hereditary and idiopathic peripheral neuropathy   . Microscopic hematuria   . Chronic  kidney disease     Kidney stones   Past Surgical History  Procedure Laterality Date  . Inguinal hernia repair  1994  . Cardioversion  09/21/2011    Procedure: CARDIOVERSION;  Surgeon: Darden PalmerW Spencer Tilley Jr., MD;  [facility];  Service: Cardiovascular;  Laterality: N/A;  . Cardiac stent  2010 or 2011 pt not sure  . Hernia repair  1989   Social History:  reports that he quit smoking about 4 years ago. His smoking use included Cigarettes. He has a 65 pack-year smoking history. He has never used smokeless tobacco. He reports that he does not drink alcohol or use illicit drugs.  No Known Allergies  Family History  Problem Relation Age of Onset  . Heart failure Father   . Heart failure Mother   . Diabetes Mother   . Coronary artery disease Brother   . Diabetes Brother   . Heart disease Brother   . Hypertension Brother      Prior to Admission medications   Medication Sig Start Date End Date Taking? Authorizing Provider  albuterol (PROVENTIL) (2.5 MG/3ML) 0.083% nebulizer solution Take 3 mLs (2.5 mg total) by nebulization every 2 (two) hours as needed for wheezing or shortness of breath. 06/04/14  Yes Kathlen ModyVijaya Akula, MD  calcium-vitamin D (OSCAL WITH D) 500-200 MG-UNIT per tablet Take 1 tablet by mouth daily with breakfast.   Yes Historical Provider, MD  carvedilol (COREG) 3.125 MG tablet Take 3.125 mg by mouth 2 (two) times daily with a meal.  10/26/13  Yes Historical Provider, MD  digoxin (LANOXIN) 0.125 MG tablet Take 0.125 mg by mouth daily.   Yes Historical Provider, MD  fentaNYL (DURAGESIC - DOSED MCG/HR) 25 MCG/HR patch Place 25 mcg onto the skin every 3 (three) days.   Yes Historical Provider, MD  Fluticasone-Salmeterol (ADVAIR) 250-50 MCG/DOSE AEPB Inhale 1 puff into the lungs 2 (two) times daily.   Yes Historical Provider, MD  gabapentin (NEURONTIN) 300 MG capsule Take 600 mg by mouth 2 (two) times daily.   Yes Historical Provider, MD  guaiFENesin (MUCINEX) 600 MG 12 hr tablet Take  600 mg by mouth 2 (two) times daily.   Yes Historical Provider, MD  ipratropium-albuterol (DUONEB) 0.5-2.5 (3) MG/3ML SOLN Take 3 mLs by nebulization 3 (three) times daily.   Yes Historical Provider, MD  isosorbide mononitrate (IMDUR) 60 MG 24 hr tablet Take 60 mg by mouth daily.     Yes Historical Provider, MD  levothyroxine (SYNTHROID, LEVOTHROID) 88 MCG tablet Take 88 mcg by mouth daily before breakfast.   Yes Historical Provider, MD  lubiprostone (AMITIZA) 24 MCG capsule Take 24 mcg by mouth 2 (two) times daily with a meal.   Yes Historical Provider, MD  niacin (NIASPAN) 500 MG CR tablet Take 500 mg by mouth at bedtime.     Yes Historical Provider, MD  pravastatin (PRAVACHOL) 20 MG tablet Take 20 mg by mouth daily.    Yes Historical Provider, MD  predniSONE (DELTASONE) 20 MG tablet Take 1 tablet (20 mg total) by mouth daily with breakfast. 06/04/14  Yes Kathlen Mody, MD  saccharomyces boulardii (FLORASTOR) 250 MG capsule Take 250 mg by mouth 2 (two) times daily.   Yes Historical Provider, MD  vitamin B-12 1000 MCG tablet Take 1 tablet (1,000 mcg total) by mouth daily. 06/04/14  Yes Kathlen Mody, MD  albuterol (PROVENTIL HFA;VENTOLIN HFA) 108 (90 BASE) MCG/ACT inhaler Inhale 2 puffs into the lungs every 6 (six) hours as needed for wheezing or shortness of breath.    Historical Provider, MD  ALPRAZolam Prudy Feeler) 0.25 MG tablet Take 0.25 mg by mouth at bedtime as needed for anxiety.    Historical Provider, MD  feeding supplement, ENSURE COMPLETE, (ENSURE COMPLETE) LIQD Take 237 mLs by mouth 2 (two) times daily between meals. 06/04/14   Kathlen Mody, MD  nitroGLYCERIN (NITROSTAT) 0.4 MG SL tablet Place 0.4 mg under the tongue every 5 (five) minutes as needed for chest pain.    Historical Provider, MD  warfarin (COUMADIN) 3 MG tablet Take 1-1.5 tablets (3-4.5 mg total) by mouth daily. Take 4.5 on Tuesday ONLY, all other days take 3mg  including Sunday 06/04/14   Kathlen Mody, MD   Physical Exam: Filed  Vitals:   06/08/14 1845 06/08/14 1848 06/08/14 1900 06/08/14 1947  BP: 126/99  109/65   Pulse:  129 112   Temp:  98.9 F (37.2 C) 98.1 F (36.7 C)   TempSrc:  Oral Oral   Resp:  29 20   Height:      Weight:      SpO2:  93% 98% 98%    Wt Readings from Last 3 Encounters:  06/08/14 68.04 kg (150 lb)  06/04/14 69.718 kg (153 lb 11.2 oz)  04/25/14 63.504 kg (140 lb)    BP 109/65  Pulse 112  Temp(Src) 98.1 F (36.7 C) (Oral)  Resp 20  Ht 5\' 8"  (1.727 m)  Wt 68.04 kg (150 lb)  BMI 22.81 kg/m2  SpO2 98%  General Appearance:    Alert,  chronically ill appearing, frail. Cooperative and appropriate. Oriented. Breathing nonlabored.  Head:    Normocephalic, without obvious abnormality, atraumatic  Eyes:    PERRL, conjunctiva/corneas clear, EOM's intact,       Nose:   Nares normal, septum midline, mucosa normal,   Throat:   Dry mucous membranes. edentulous  Neck:   Supple, symmetrical, trachea midline, no adenopathy;       thyroid:  No enlargement/tenderness/nodules; no carotid   bruit or JVD  Back:     Symmetric, no curvature, ROM normal, no CVA tenderness  Lungs:     Rales at bases with bronchial breath sounds. No wheeze. No rhonchi  Chest wall:    No tenderness or deformity  Heart:    Regular rate and rhythm, S1 and S2 normal, no murmur, rub   or gallop  Abdomen:     Soft, non-tender, bowel sounds active all four quadrants,    no masses, no organomegaly  Genitalia:    deferred  Rectal:    deferred  Extremities:   Extremities normal, atraumatic, no cyanosis or edema  Pulses:   2+ and symmetric all extremities  Skin:   Skin color, texture, turgor normal, no rashes or lesions  Lymph nodes:   Cervical, supraclavicular, and axillary nodes normal  Neurologic:   CNII-XII intact. Normal strength, sensation and reflexes      throughout             Psych: normal affect  Labs on Admission:  Basic Metabolic Panel:  Recent Labs Lab 06/08/14 1227  NA 138  K 4.5  CL 97  CO2 31   GLUCOSE 181*  BUN 19  CREATININE 0.77  CALCIUM 8.5   Liver Function Tests:  Recent Labs Lab 06/08/14 1227  AST 27  ALT 32  ALKPHOS 75  BILITOT 1.0  PROT 6.8  ALBUMIN 3.4*   No results found for this basename: LIPASE, AMYLASE,  in the last 168 hours No results found for this basename: AMMONIA,  in the last 168 hours CBC:  Recent Labs Lab 06/03/14 1150 06/08/14 1215  WBC 11.2* 11.7*  HGB 9.1* 10.0*  HCT 28.9* 32.0*  MCV 102.5* 103.9*  PLT 214 174   Cardiac Enzymes:  Recent Labs Lab 06/08/14 1240  TROPONINI <0.30    BNP (last 3 results)  Recent Labs  05/29/14 1230 06/08/14 1227  PROBNP 1155.0* 2053.0*   CBG:  Recent Labs Lab 06/02/14 2117  GLUCAP 117*    Radiological Exams on Admission: Ct Angio Chest W/cm &/or Wo Cm  06/08/2014   CLINICAL DATA:  Initial encounter for severe shortness of breath onset today with chest pain.  EXAM: CT ANGIOGRAPHY CHEST WITH CONTRAST  TECHNIQUE: Multidetector CT imaging of the chest was performed using the standard protocol during bolus administration of intravenous contrast. Multiplanar CT image reconstructions and MIPs were obtained to evaluate the vascular anatomy.  CONTRAST:  60mL OMNIPAQUE IOHEXOL 350 MG/ML SOLN  COMPARISON:  05/22/2014.  FINDINGS: No filling defects within the opacified pulmonary arteries to suggest the presence of an acute pulmonary embolus. Inadequate visualization of the thoracic aorta to allow assessment for dissection. There is no thoracic aortic aneurysm.  No axillary lymphadenopathy. 8 mm short axis right paratracheal lymph node is stable. No hilar or subcarinal lymphadenopathy. Heart is markedly enlarged. Coronary artery calcification is noted. No pericardial effusion.  Lung windows show markedly advanced emphysema bilaterally. There is posterior right upper lobe and bilateral lower lobe airspace disease, progressed in the interval. Interstitial markings  are more prominent than on the previous study.  Small bilateral pleural effusions are associated.  Bone windows reveal no worrisome lytic or sclerotic osseous lesions.  Review of the MIP images confirms the above findings.  IMPRESSION: No CT evidence for acute pulmonary embolus.  Interval increase in interstitial opacity with airspace disease in the posterior right upper and posterior bilateral lower lobes. Imaging features may be related to dependent edema in this markedly emphysematous patient although diffuse infection cannot be excluded.   Electronically Signed   By: Kennith CenterEric  Mansell M.D.   On: 06/08/2014 14:43   Dg Chest Portable 1 View  06/08/2014   CLINICAL DATA:  Subsequent encounter for two-month history of hemoptysis  EXAM: PORTABLE CHEST - 1 VIEW  COMPARISON:  05/31/2014.  A 02/17/2014.  FINDINGS: The 1310 hrs. Interval development of interstitial and bilateral lower lobe airspace disease. Small bilateral pleural effusions noted. Cardiopericardial silhouette is at upper limits of normal for size. Bones are diffusely demineralized. Telemetry leads overlie the chest.  IMPRESSION: The interval development of mid and lower lung interstitial disease with basilar alveolar component. Imaging features could be related to the dependent edema in emphysematous patient. Diffuse infection could also have this appearance.  Stable tiny bilateral pleural effusions.  Underlying chronic interstitial lung disease.   Electronically Signed   By: Kennith CenterEric  Mansell M.D.   On: 06/08/2014 13:30    EKG: Iatrial fib with RVR  Assessment/Plan Principal Problem:   HCAP (healthcare-associated pneumonia): CT shows worsening. Was on rocephin and azithro previous hospitalization. Will give vanc, cefepime. Will get MBS r/o aspiration.  SDU Active Problems:   Hemoptysis secondary to above: coumadin and plavix held since last admission. Monitor. SCDs only.   Atrial fibrillation with RVR: likely from above. Rate better after cardizem push. Continue digoxin, carvedilol. Add prn  metoprolol IV. Check TSH   COPD (chronic obstructive pulmonary disease): no wheeze currently. Continue nebs and pred 20.   Interstitial pulmonary fibrosis   CAD (coronary artery disease)   Hypothyroidism   Acute on chronic respiratory failure   Physical deconditioning   Code Status: full DVT Prophylaxis:  SCDs Family Communication: at bedside Disposition Plan: SDU  Time spent: 60 min  Damon Baisch L [facility] Pager [phone]

## 2014-06-08 NOTE — ED Notes (Signed)
Returned from  X-ray

## 2014-06-08 NOTE — Progress Notes (Signed)
I have read the note and agree with the student's assessment and plan for this patient.  Rossie Bretado D. Rashiya Lofland, PharmD, BCPS Clinical Pharmacist Pager: [phone] 06/08/2014 4:39 PM

## 2014-06-08 NOTE — Telephone Encounter (Signed)
He needs to have an ROV schedule next week with chest xray.  This can be with Tammy Parrett if I don't have anything available.

## 2014-06-08 NOTE — ED Notes (Signed)
Admit Doctor at bedside.  

## 2014-06-08 NOTE — ED Notes (Signed)
Spoke with EDP regarding if blood cultures are needed prior to starting antibiotics. States will order blood cultures and wait until drawn then start antibiotics.

## 2014-06-08 NOTE — Progress Notes (Signed)
ABG results given to MD.  

## 2014-06-08 NOTE — Telephone Encounter (Signed)
Called and spoke with pts daughter and she stated that they are taking her father to the ER for elevated HR.  She stated that they will just see him for everything today.  Will forward to VS to make him aware.

## 2014-06-08 NOTE — ED Notes (Signed)
Respiatory at bedside and EDP notified of patient.

## 2014-06-08 NOTE — ED Notes (Signed)
Discharge from hospital one week ago and Nursing home stated shortness of breath and chest pain.

## 2014-06-08 NOTE — Telephone Encounter (Signed)
Called and spoke to pt's daughter, [name]. Pt was recently d/c from hospital on 10/5 for pna and hemoptysis and is currently at [facility]. Pt was d/c with active hemoptysis. Pt was improving when d/c but Travis Bond states his hemoptysis has not improved. Travis Bond stated the hemoptysis has been less than 1/2 cup in past 12 hours but states that there have been times when he will cough up blood that is the size of silver dollars and is more coagulated than normal loose blood. Pt has been ambulating more since being at [facility] and pt states his breathing has improved since d/c. Pt not currently taking plavix and coumadin.   VS please advise.

## 2014-06-08 NOTE — ED Notes (Signed)
Patients lactic acid results given to pisciotta

## 2014-06-08 NOTE — ED Provider Notes (Signed)
CSN: [id number]     Arrival date & time 06/08/14  1204 History   First MD Initiated Contact with Patient 06/08/14 1206     Chief Complaint  Patient presents with  . Shortness of Breath  . Chest Pain     (Consider location/radiation/quality/duration/timing/severity/associated sxs/prior Treatment) Patient is a 80 y.o. male presenting with shortness of breath and chest pain.  Shortness of Breath Severity:  Moderate Onset quality:  Gradual Timing:  Constant Progression:  Worsening Chronicity:  Chronic Relieved by:  Nothing Worsened by:  Nothing tried Ineffective treatments:  None tried Associated symptoms: chest pain (tightness with breathing)   Associated symptoms: no cough, no fever and no vomiting   Chest Pain Associated symptoms: shortness of breath   Associated symptoms: no cough, no fever and not vomiting     Past Medical History  Diagnosis Date  . COPD (chronic obstructive pulmonary disease)   . Chronic respiratory failure with hypoxia   . Pulmonary fibrosis   . Coronary artery disease   . Hypertension   . Hyperlipidemia   . Carotid stenosis   . Peripheral vascular disease   . Anemia   . Peripheral neuropathy   . B12 deficiency   . Pneumonia   . Atrial fibrillation   . Hypothyroidism   . Anxiety state, unspecified   . Unspecified deficiency anemia   . Abdominal aneurysm without mention of rupture   . Aortic valve disorders   . Type II or unspecified type diabetes mellitus without mention of complication, not stated as uncontrolled   . Unspecified hereditary and idiopathic peripheral neuropathy   . Microscopic hematuria   . Chronic kidney disease     Kidney stones   Past Surgical History  Procedure Laterality Date  . Inguinal hernia repair  1994  . Cardioversion  09/21/2011    Procedure: CARDIOVERSION;  Surgeon: Darden PalmerW Spencer Tilley Jr., MD;  [facility];  Service: Cardiovascular;  Laterality: N/A;  . Cardiac stent  2010 or 2011 pt not sure  . Hernia  repair  1989   Family History  Problem Relation Age of Onset  . Heart failure Father   . Heart failure Mother   . Diabetes Mother   . Coronary artery disease Brother   . Diabetes Brother   . Heart disease Brother   . Hypertension Brother    History  Substance Use Topics  . Smoking status: Former Smoker -- 1.00 packs/day for 65 years    Types: Cigarettes    Quit date: 01/18/2010  . Smokeless tobacco: Never Used  . Alcohol Use: No    Review of Systems  Unable to perform ROS: Acuity of condition  Constitutional: Negative for fever.  Respiratory: Positive for shortness of breath. Negative for cough.   Cardiovascular: Positive for chest pain (tightness with breathing).  Gastrointestinal: Negative for vomiting.      Allergies  Review of patient's allergies indicates no known allergies.  Home Medications   Prior to Admission medications   Medication Sig Start Date End Date Taking? Authorizing Provider  albuterol (PROVENTIL) (2.5 MG/3ML) 0.083% nebulizer solution Take 3 mLs (2.5 mg total) by nebulization every 2 (two) hours as needed for wheezing or shortness of breath. 06/04/14  Yes Kathlen ModyVijaya Akula, MD  calcium-vitamin D (OSCAL WITH D) 500-200 MG-UNIT per tablet Take 1 tablet by mouth daily with breakfast.   Yes Historical Provider, MD  carvedilol (COREG) 3.125 MG tablet Take 3.125 mg by mouth 2 (two) times daily with a meal.  10/26/13  Yes Historical Provider, MD  digoxin (LANOXIN) 0.125 MG tablet Take 0.125 mg by mouth daily.   Yes Historical Provider, MD  fentaNYL (DURAGESIC - DOSED MCG/HR) 25 MCG/HR patch Place 25 mcg onto the skin every 3 (three) days.   Yes Historical Provider, MD  Fluticasone-Salmeterol (ADVAIR) 250-50 MCG/DOSE AEPB Inhale 1 puff into the lungs 2 (two) times daily.   Yes Historical Provider, MD  gabapentin (NEURONTIN) 300 MG capsule Take 600 mg by mouth 2 (two) times daily.   Yes Historical Provider, MD  guaiFENesin (MUCINEX) 600 MG 12 hr tablet Take 600 mg by  mouth 2 (two) times daily.   Yes Historical Provider, MD  ipratropium-albuterol (DUONEB) 0.5-2.5 (3) MG/3ML SOLN Take 3 mLs by nebulization 3 (three) times daily.   Yes Historical Provider, MD  isosorbide mononitrate (IMDUR) 60 MG 24 hr tablet Take 60 mg by mouth daily.     Yes Historical Provider, MD  levothyroxine (SYNTHROID, LEVOTHROID) 88 MCG tablet Take 88 mcg by mouth daily before breakfast.   Yes Historical Provider, MD  lubiprostone (AMITIZA) 24 MCG capsule Take 24 mcg by mouth 2 (two) times daily with a meal.   Yes Historical Provider, MD  niacin (NIASPAN) 500 MG CR tablet Take 500 mg by mouth at bedtime.     Yes Historical Provider, MD  pravastatin (PRAVACHOL) 20 MG tablet Take 20 mg by mouth daily.    Yes Historical Provider, MD  predniSONE (DELTASONE) 20 MG tablet Take 1 tablet (20 mg total) by mouth daily with breakfast. 06/04/14  Yes Kathlen ModyVijaya Akula, MD  saccharomyces boulardii (FLORASTOR) 250 MG capsule Take 250 mg by mouth 2 (two) times daily.   Yes Historical Provider, MD  vitamin B-12 1000 MCG tablet Take 1 tablet (1,000 mcg total) by mouth daily. 06/04/14  Yes Kathlen ModyVijaya Akula, MD  albuterol (PROVENTIL HFA;VENTOLIN HFA) 108 (90 BASE) MCG/ACT inhaler Inhale 2 puffs into the lungs every 6 (six) hours as needed for wheezing or shortness of breath.    Historical Provider, MD  ALPRAZolam Prudy Feeler(XANAX) 0.25 MG tablet Take 0.25 mg by mouth at bedtime as needed for anxiety.    Historical Provider, MD  feeding supplement, ENSURE COMPLETE, (ENSURE COMPLETE) LIQD Take 237 mLs by mouth 2 (two) times daily between meals. 06/04/14   Kathlen ModyVijaya Akula, MD  nitroGLYCERIN (NITROSTAT) 0.4 MG SL tablet Place 0.4 mg under the tongue every 5 (five) minutes as needed for chest pain.    Historical Provider, MD  warfarin (COUMADIN) 3 MG tablet Take 1-1.5 tablets (3-4.5 mg total) by mouth daily. Take 4.5 on Tuesday ONLY, all other days take 3mg  including Sunday 06/04/14   Kathlen ModyVijaya Akula, MD   BP 112/58  Pulse 84  Temp(Src) 98.5  F (36.9 C) (Oral)  Resp 18  Ht 5\' 8"  (1.727 m)  Wt 149 lb 14.6 oz (68 kg)  BMI 22.80 kg/m2  SpO2 99% Physical Exam  Vitals reviewed. Constitutional: He is oriented to person, place, and time. He appears well-developed and well-nourished.  HENT:  Head: Normocephalic and atraumatic.  Eyes: Conjunctivae and EOM are normal.  Neck: Normal range of motion. Neck supple.  Cardiovascular: Normal rate, regular rhythm and normal heart sounds.   Pulmonary/Chest: Effort normal. No respiratory distress. He has wheezes (diffuse). He has rales in the right lower field and the left lower field.  Abdominal: He exhibits no distension. There is no tenderness. There is no rebound and no guarding.  Musculoskeletal: Normal range of motion.  Neurological: He is alert and oriented to  person, place, and time.  Skin: Skin is warm and dry.    ED Course  Procedures (including critical care time) Labs Review Labs Reviewed  CBC - Abnormal; Notable for the following:    WBC 11.7 (*)    RBC 3.08 (*)    Hemoglobin 10.0 (*)    HCT 32.0 (*)    MCV 103.9 (*)    RDW 19.5 (*)    All other components within normal limits  PRO B NATRIURETIC PEPTIDE - Abnormal; Notable for the following:    Pro B Natriuretic peptide (BNP) 2053.0 (*)    All other components within normal limits  COMPREHENSIVE METABOLIC PANEL - Abnormal; Notable for the following:    Glucose, Bld 181 (*)    Albumin 3.4 (*)    GFR calc non Af Amer 81 (*)    All other components within normal limits  CBC WITH DIFFERENTIAL - Abnormal; Notable for the following:    RBC 2.71 (*)    Hemoglobin 9.1 (*)    HCT 28.8 (*)    MCV 106.3 (*)    RDW 20.2 (*)    Platelets 141 (*)    All other components within normal limits  TROPONIN I - Abnormal; Notable for the following:    Troponin I 0.35 (*)    All other components within normal limits  TSH - Abnormal; Notable for the following:    TSH 5.070 (*)    All other components within normal limits   VANCOMYCIN, TROUGH - Abnormal; Notable for the following:    Vancomycin Tr 32.3 (*)    All other components within normal limits  I-STAT ARTERIAL BLOOD GAS, ED - Abnormal; Notable for the following:    pCO2 arterial 53.3 (*)    pO2, Arterial 66.0 (*)    Bicarbonate 32.4 (*)    Acid-Base Excess 6.0 (*)    All other components within normal limits  CULTURE, BLOOD (ROUTINE X 2)  CULTURE, BLOOD (ROUTINE X 2)  MRSA PCR SCREENING  CULTURE, EXPECTORATED SPUTUM-ASSESSMENT  PROTIME-INR  TROPONIN I  TROPONIN I  TROPONIN I  VANCOMYCIN, TROUGH  BASIC METABOLIC PANEL  CBC  CBC  BASIC METABOLIC PANEL  I-STAT TROPOININ, ED  I-STAT CG4 LACTIC ACID, ED    Imaging Review No results found.   EKG Interpretation   Date/Time:  Friday June 08 2014 12:16:48 EDT Ventricular Rate:  155 PR Interval:    QRS Duration: 78 QT Interval:  268 QTC Calculation: 430 R Axis:   86 Text Interpretation:  Atrial fibrillation with rapid V-rate Borderline  right axis deviation Repolarization abnormality, prob rate related  Nonspecific ST abnormality Confirmed by Mirian Mo [phone]) on  06/08/2014 12:28:32 PM      MDM   Final diagnoses:  Atrial fibrillation with RVR  HCAP (healthcare-associated pneumonia)  Elevated troponin  Hemoptysis  Macrocytic anemia  Acute on chronic respiratory failure with hypoxia    80 y.o. male with pertinent PMH of COPD, CAD, Afib (on coumadin), pulmonary fibrosis presents with recurrent dyspnea and chest pressure.  Pt hypoxic above baseline of 2-3L O2 and requiring 5L O2 with sats in low 90s.  Also in afib with RVR.  Obtained CT PE study which was significant for possible PNA.  Broad spectrum abx given.  Given diltiazem with improvement in rate.  Admitted in stable condition.    1. Atrial fibrillation with RVR   2. HCAP (healthcare-associated pneumonia)   3. Elevated troponin   4. Hemoptysis   5. Macrocytic anemia  6. Acute on chronic respiratory failure with  hypoxia         Mirian Mo, MD 06/11/14 1453

## 2014-06-08 NOTE — ED Notes (Signed)
Phlebotomy at bedside.

## 2014-06-08 NOTE — Progress Notes (Addendum)
ANTIBIOTIC CONSULT NOTE - INITIAL  Pharmacy Consult for Cefepime and Vancomycin Indication: pneumonia  No Known Allergies  Patient Measurements: Height: 5\' 8"  (172.7 cm) Weight: 150 lb (68.04 kg) IBW/kg (Calculated) : 68.4  Vital Signs: Temp: 98.4 F (36.9 C) (10/09 1227) Temp Source: Oral (10/09 1227) BP: 123/62 mmHg (10/09 1615) Pulse Rate: 83 (10/09 1615) Intake/Output from previous day:   Intake/Output from this shift:    Labs:  Recent Labs  06/08/14 1215 06/08/14 1227  WBC 11.7*  --   HGB 10.0*  --   PLT 174  --   CREATININE  --  0.77   Estimated Creatinine Clearance: 66.1 ml/min (by C-G formula based on Cr of 0.77). No results found for this basename: VANCOTROUGH, Leodis BinetVANCOPEAK, VANCORANDOM, GENTTROUGH, GENTPEAK, GENTRANDOM, TOBRATROUGH, TOBRAPEAK, TOBRARND, AMIKACINPEAK, AMIKACINTROU, AMIKACIN,  in the last 72 hours   Microbiology: Recent Results (from the past 720 hour(s))  MRSA PCR SCREENING     Status: None   Collection Time    05/22/14 11:59 PM      Result Value Ref Range Status   MRSA by PCR NEGATIVE  NEGATIVE Final   Comment:            The GeneXpert MRSA Assay (FDA     approved for NASAL specimens     only), is one component of a     comprehensive MRSA colonization     surveillance program. It is not     intended to diagnose MRSA     infection nor to guide or     monitor treatment for     MRSA infections.  CULTURE, BLOOD (ROUTINE X 2)     Status: None   Collection Time    05/23/14 12:45 AM      Result Value Ref Range Status   Specimen Description BLOOD LEFT ARM   Final   Special Requests BOTTLES DRAWN AEROBIC ONLY 5CC   Final   Culture  Setup Time     Final   Value: 05/23/2014 08:36     Performed at [facility]   Culture     Final   Value: NO GROWTH 5 DAYS     Performed at [facility]   Report Status 05/29/2014 FINAL   Final  CULTURE, BLOOD (ROUTINE X 2)     Status: None   Collection Time    05/23/14 12:55 AM   Result Value Ref Range Status   Specimen Description BLOOD LEFT HAND   Final   Special Requests BOTTLES DRAWN AEROBIC ONLY 10CC   Final   Culture  Setup Time     Final   Value: 05/23/2014 08:36     Performed at [facility]   Culture     Final   Value: NO GROWTH 5 DAYS     Performed at [facility]   Report Status 05/29/2014 FINAL   Final  CULTURE, EXPECTORATED SPUTUM-ASSESSMENT     Status: None   Collection Time    05/25/14  3:32 PM      Result Value Ref Range Status   Specimen Description SPUTUM   Final   Special Requests NONE   Final   Sputum evaluation     Final   Value: THIS SPECIMEN IS ACCEPTABLE. RESPIRATORY CULTURE REPORT TO FOLLOW.   Report Status 05/25/2014 FINAL   Final  CULTURE, RESPIRATORY (NON-EXPECTORATED)     Status: None   Collection Time    05/25/14  3:32 PM  Result Value Ref Range Status   Specimen Description SPUTUM   Final   Special Requests NONE   Final   Gram Stain     Final   Value: FEW WBC PRESENT, PREDOMINANTLY MONONUCLEAR     RARE SQUAMOUS EPITHELIAL CELLS PRESENT     NO ORGANISMS SEEN     Performed at [facility]   Culture     Final   Value: NORMAL OROPHARYNGEAL FLORA     Performed at [facility]   Report Status 05/27/2014 FINAL   Final  CULTURE, EXPECTORATED SPUTUM-ASSESSMENT     Status: None   Collection Time    06/04/14  8:00 AM      Result Value Ref Range Status   Specimen Description SPUTUM   Final   Special Requests NONE   Final   Sputum evaluation     Final   Value: THIS SPECIMEN IS ACCEPTABLE. RESPIRATORY CULTURE REPORT TO FOLLOW.   Report Status 06/04/2014 FINAL   Final  CULTURE, RESPIRATORY (NON-EXPECTORATED)     Status: None   Collection Time    06/04/14  8:00 AM      Result Value Ref Range Status   Specimen Description SPUTUM   Final   Special Requests NONE   Final   Gram Stain     Final   Value: RARE WBC PRESENT, PREDOMINANTLY MONONUCLEAR     RARE SQUAMOUS EPITHELIAL CELLS PRESENT      FEW GRAM NEGATIVE RODS     RARE GRAM POSITIVE COCCI     IN PAIRS IN CLUSTERS   Culture     Final   Value: NORMAL OROPHARYNGEAL FLORA     Performed at [facility]   Report Status 06/06/2014 FINAL   Final    Medical History: Past Medical History  Diagnosis Date  . COPD (chronic obstructive pulmonary disease)   . Chronic respiratory failure with hypoxia   . Pulmonary fibrosis   . Coronary artery disease   . Hypertension   . Hyperlipidemia   . Carotid stenosis   . Peripheral vascular disease   . Anemia   . Peripheral neuropathy   . B12 deficiency   . Pneumonia   . Atrial fibrillation   . Hypothyroidism   . Anxiety state, unspecified   . Unspecified deficiency anemia   . Abdominal aneurysm without mention of rupture   . Aortic valve disorders   . Type II or unspecified type diabetes mellitus without mention of complication, not stated as uncontrolled   . Unspecified hereditary and idiopathic peripheral neuropathy   . Microscopic hematuria   . Chronic kidney disease     Kidney stones   Assessment: 80 y/o M w/ SOB and chest pain.  CXR worrisome for pneumonia versus COPD exacerbation.   Afebrile, BP 123/62, WBC 11.7.  SCr 0.8mg /dL, CrCl 04/566/1 mL/min  Goal of Therapy:  Resolve infection  Plan:  Cefepime IV 1 gram q8h Vancomycin IV 1250mg  x 1 in ED, f/u for dosing.   Marisue BrooklynGazda, Nicholas 06/08/2014,4:28 PM  Addn: Pharmacy is consulted to dose vancomycin for pneumonia along with the cefepime already ordered. Pt received vancomycin 1250mg  IV load in the ED at 1800. Will redose vancomycin 750mg  IV q12h.  Arlean Hoppingorey M. Newman PiesBall, PharmD Clinical Pharmacist Pager [phone]

## 2014-06-09 ENCOUNTER — Inpatient Hospital Stay (HOSPITAL_COMMUNITY): Payer: Medicare Other

## 2014-06-09 DIAGNOSIS — R042 Hemoptysis: Secondary | ICD-10-CM

## 2014-06-09 DIAGNOSIS — R778 Other specified abnormalities of plasma proteins: Secondary | ICD-10-CM | POA: Diagnosis not present

## 2014-06-09 DIAGNOSIS — R7989 Other specified abnormal findings of blood chemistry: Secondary | ICD-10-CM | POA: Diagnosis not present

## 2014-06-09 DIAGNOSIS — D539 Nutritional anemia, unspecified: Secondary | ICD-10-CM | POA: Diagnosis present

## 2014-06-09 LAB — CBC WITH DIFFERENTIAL/PLATELET
Basophils Absolute: 0 10*3/uL (ref 0.0–0.1)
Basophils Relative: 0 % (ref 0–1)
EOS ABS: 0 10*3/uL (ref 0.0–0.7)
Eosinophils Relative: 1 % (ref 0–5)
HCT: 28.8 % — ABNORMAL LOW (ref 39.0–52.0)
Hemoglobin: 9.1 g/dL — ABNORMAL LOW (ref 13.0–17.0)
LYMPHS ABS: 1.6 10*3/uL (ref 0.7–4.0)
Lymphocytes Relative: 26 % (ref 12–46)
MCH: 33.6 pg (ref 26.0–34.0)
MCHC: 31.6 g/dL (ref 30.0–36.0)
MCV: 106.3 fL — ABNORMAL HIGH (ref 78.0–100.0)
Monocytes Absolute: 0.6 10*3/uL (ref 0.1–1.0)
Monocytes Relative: 10 % (ref 3–12)
NEUTROS ABS: 3.7 10*3/uL (ref 1.7–7.7)
NEUTROS PCT: 63 % (ref 43–77)
Platelets: 141 10*3/uL — ABNORMAL LOW (ref 150–400)
RBC: 2.71 MIL/uL — ABNORMAL LOW (ref 4.22–5.81)
RDW: 20.2 % — ABNORMAL HIGH (ref 11.5–15.5)
WBC: 6 10*3/uL (ref 4.0–10.5)

## 2014-06-09 LAB — TROPONIN I: TROPONIN I: 0.35 ng/mL — AB (ref <0.30)

## 2014-06-09 LAB — TSH: TSH: 5.07 u[IU]/mL — AB (ref 0.350–4.500)

## 2014-06-09 MED ORDER — LEVALBUTEROL HCL 0.63 MG/3ML IN NEBU
0.6300 mg | INHALATION_SOLUTION | Freq: Four times a day (QID) | RESPIRATORY_TRACT | Status: DC
  Administered 2014-06-09 – 2014-06-13 (×16): 0.63 mg via RESPIRATORY_TRACT
  Filled 2014-06-09 (×34): qty 3

## 2014-06-09 MED ORDER — PREDNISONE 10 MG PO TABS
10.0000 mg | ORAL_TABLET | Freq: Every day | ORAL | Status: DC
  Administered 2014-06-10 – 2014-06-13 (×4): 10 mg via ORAL
  Filled 2014-06-09 (×5): qty 1

## 2014-06-09 MED ORDER — DILTIAZEM HCL 30 MG PO TABS
30.0000 mg | ORAL_TABLET | Freq: Four times a day (QID) | ORAL | Status: DC
Start: 2014-06-09 — End: 2014-06-13
  Administered 2014-06-09 – 2014-06-13 (×16): 30 mg via ORAL
  Filled 2014-06-09 (×22): qty 1

## 2014-06-09 NOTE — Progress Notes (Signed)
Order for MBS received, pt on schedule for today between 1030 and 1045 per xray.  Pt informed of pending evaluation and did not have questions re: test.    He reported he "had this test before" - note esophagram completed 10/2013 that showed dysmotility.    Full MBS report to follow after test. Thanks for the order.   Donavan Burnetamara Jil Penland, MS [facility] SLP [phone]

## 2014-06-09 NOTE — Evaluation (Signed)
Physical Therapy Evaluation Patient Details Name: Travis Bond MRN: [id number] DOB: 1927-06-21 Today's Date: 06/09/2014   History of Present Illness  80 yo male [facility] with [facility] difficulties,found to have worsening pna.  Pt has end stage COPD,  hypothyroid, HCAP (healthcare-associated pneumonia  Clinical Impression  Patient presents with generalized weakness and dependencies in mobility and balance. Patient will benefit from further PT to increase mobility and independence.  Patient will need to be fairly independent to return home as he is alone during day.  Recommend Rehab consult to allow patient to reach max potential for ultimate return home.    Follow Up Recommendations CIR    Equipment Recommendations  Other (comment) (rollator)    Recommendations for Other Services OT consult;Rehab consult     Precautions / Restrictions Precautions Precautions: Fall Precaution Comments: watch 02 sats      Mobility  Bed Mobility Overal bed mobility: Modified Independent             General bed mobility comments: used railing to get to EOB  Transfers Overall transfer level: Needs assistance Equipment used: Rolling walker (2 wheeled) Transfers: Sit to/from [employer];Stand Pivot Transfers Sit to Stand: Min guard Stand pivot transfers: Min guard       General transfer comment: evaluation limited as dinner arrived as session began and patient wanted to eat  Ambulation/Gait                Stairs            Wheelchair Mobility    Modified Rankin (Stroke Patients Only)       Balance Overall balance assessment: Needs assistance Sitting-balance support: No upper extremity supported;Feet supported Sitting balance-Leahy Scale: Good     Standing balance support: Bilateral upper extremity supported Standing balance-Leahy Scale: Poor                               Pertinent Vitals/Pain Pain Assessment: No/denies pain    Home  Living Family/patient expects to be discharged to:: Private residence Living Arrangements: Children Available Help at Discharge: Available PRN/intermittently;Family Type of Home: House       Home Layout: Two level Home Equipment: Shower seat Additional Comments: per previous admission - family considering putting in chair lift at steps.     Prior Function Level of Independence: Needs assistance   Gait / Transfers Assistance Needed: walked independently without AD  ADL's / Homemaking Assistance Needed: HHaide has been assisting with bathing and home mgmt  Comments: pt has O2 at home     Hand Dominance   Dominant Hand: Right    Extremity/Trunk Assessment   Upper Extremity Assessment: Generalized weakness           Lower Extremity Assessment: Generalized weakness         Communication   Communication: No difficulties  Cognition Arousal/Alertness: Awake/alert Behavior During Therapy: WFL for tasks assessed/performed Overall Cognitive Status: Within Functional Limits for tasks assessed                      General Comments      Exercises        Assessment/Plan    PT Assessment Patient needs continued PT services  PT Diagnosis Generalized weakness   PT Problem List Decreased strength;Decreased activity tolerance;Decreased balance;Decreased mobility;Decreased knowledge of use of DME;Decreased knowledge of precautions;Cardiopulmonary status limiting activity  PT Treatment Interventions DME instruction;Gait training;Functional mobility training;Therapeutic activities;Therapeutic  exercise;Stair training;Balance training;Patient/family education   PT Goals (Current goals can be found in the Care Plan section) Acute Rehab PT Goals Patient Stated Goal: none stated PT Goal Formulation: With patient Time For Goal Achievement: 06/23/14 Potential to Achieve Goals: Good    Frequency Min 3X/week   Barriers to discharge Decreased caregiver  support;Inaccessible home environment      Co-evaluation               End of Session Equipment Utilized During Treatment: Oxygen Activity Tolerance: Patient tolerated treatment well (evaluation limited by arrival of dinner) Patient left: in chair;with call bell/phone within reach           Time: [phone] [id number] PT Time Calculation (min): 22 min   Charges:   PT Evaluation $Initial PT Evaluation Tier I: 1 Procedure PT Treatments $Therapeutic Activity: 8-22 mins   PT G CodesOlivia Bond:          Travis Bond 06/09/2014, 5:04 PM 06/09/2014 Corlis HoveMargie Chrisean Bond, PT [phone]

## 2014-06-09 NOTE — Progress Notes (Addendum)
[facility] PROGRESS NOTE  Travis SnideGrady Pring WUJ:[id number] DOB: June 24, 1927 DOA: 06/08/2014 PCP: No primary provider on file.  Summary 80 y.o. male  With h/o recent admission for CAP and hemoptysis, atrial fibrillation (plavix and warfarin held for hemoptysis), chronic respiratory failure, copd, pulmonary fibrosis, CAD with stent presents to ED with worsening dyspnea. Has had continued hemoptysis, about a tablespoon at a time, as well as purulent sputum. In ED, HR 150, atrial fib. Also, sharp central chest pain alleviated by lying on right side. Usually on 2 liters oxygen, ED staff increased to 6 liters. After 5 mg iv cardizem, HR about 110. CTA chest shows no PE, but does show worsening infiltrate in RUL and bilateral lower lobes. Denies fever, chills. Discharge from hospitalist service 10/5 to SNF. proBNP 2000 (1000) last admission. Speech therapy assessed swallow function at bedside at SNF, and no reported problems. Patient has had pneumonia earlier this year as well   Assessment/Plan:  Principal Problem:   HCAP (healthcare-associated pneumonia): continue vanc and cefepime. MBS pending.  Sputum cx not collected yet. Recent urine legionella and pneumococcus ag neg. Active Problems:   COPD (chronic obstructive pulmonary disease) without exacerbation. Change xopenex to qid. Continue atrovent. Wean pred   Interstitial pulmonary fibrosis   CAD (coronary artery disease)   Hypothyroidism: TSH 5, but hesitant to increase synthroid with tachycardia.  Recommend repeating in a few months when acute illness resolves   Hemoptysis: secondary to pna. Tapering off. Off warfarin and plavix.   Atrial fibrillation with RVR: rate better, but still about 110. Will add cardizem as BP tolerates   Acute on chronic respiratory failure: usually on 2.5 - 3 liters O2. On 3.5 now and looking much more comfortable   Physical deconditioning: PT eval pending. Back to SNF when stable single elevated troponin. Likely  from pneumonia and a fib RVR. No evidence of ACS Macrocytic anemia: last B12 borderline low. Will give B12 shots while here  Transfer to tele  Code Status:  full Family Communication:  10/8 daughter Disposition Plan:  SNF when stable  Consultants:    Procedures:     Antibiotics:  Vanc, cefepime 10/8  HPI/Subjective: Feels much better. A few teaspoons of hemoptysis. No dyspnea. Now patient tells me his CP is chronic "for years"  Objective: Filed Vitals:   06/09/14 0900  BP: 131/90  Pulse: 84  Temp:   Resp: 14    Intake/Output Summary (Last 24 hours) at 06/09/14 0932 Last data filed at 06/09/14 0600  Gross per 24 hour  Intake  357.5 ml  Output    275 ml  Net   82.5 ml   Filed Weights   06/08/14 1227 06/08/14 2000  Weight: 68.04 kg (150 lb) 68 kg (149 lb 14.6 oz)   Tele: atrial fib rate around 110  Exam:   General:  Comfortable. Eating breakfast  Cardiovascular: irreg irreg without MGR  Respiratory: CTA without WRR  Abdomen: S, NT, ND  Ext: no CCE  Basic Metabolic Panel:  Recent Labs Lab 06/08/14 1227  NA 138  K 4.5  CL 97  CO2 31  GLUCOSE 181*  BUN 19  CREATININE 0.77  CALCIUM 8.5   Liver Function Tests:  Recent Labs Lab 06/08/14 1227  AST 27  ALT 32  ALKPHOS 75  BILITOT 1.0  PROT 6.8  ALBUMIN 3.4*   No results found for this basename: LIPASE, AMYLASE,  in the last 168 hours No results found for this basename: AMMONIA,  in the last 168  hours CBC:  Recent Labs Lab 06/03/14 1150 06/08/14 1215 06/09/14 0623  WBC 11.2* 11.7* 6.0  NEUTROABS  --   --  3.7  HGB 9.1* 10.0* 9.1*  HCT 28.9* 32.0* 28.8*  MCV 102.5* 103.9* 106.3*  PLT 214 174 141*   Cardiac Enzymes:  Recent Labs Lab 06/08/14 1240 06/08/14 2021 06/09/14 0016 06/09/14 0623  TROPONINI <0.30 <0.30 0.35* <0.30   BNP (last 3 results)  Recent Labs  05/29/14 1230 06/08/14 1227  PROBNP 1155.0* 2053.0*   CBG:  Recent Labs Lab 06/02/14 2117  GLUCAP  117*    Recent Results (from the past 240 hour(s))  CULTURE, EXPECTORATED SPUTUM-ASSESSMENT     Status: None   Collection Time    06/04/14  8:00 AM      Result Value Ref Range Status   Specimen Description SPUTUM   Final   Special Requests NONE   Final   Sputum evaluation     Final   Value: THIS SPECIMEN IS ACCEPTABLE. RESPIRATORY CULTURE REPORT TO FOLLOW.   Report Status 06/04/2014 FINAL   Final  CULTURE, RESPIRATORY (NON-EXPECTORATED)     Status: None   Collection Time    06/04/14  8:00 AM      Result Value Ref Range Status   Specimen Description SPUTUM   Final   Special Requests NONE   Final   Gram Stain     Final   Value: RARE WBC PRESENT, PREDOMINANTLY MONONUCLEAR     RARE SQUAMOUS EPITHELIAL CELLS PRESENT     FEW GRAM NEGATIVE RODS     RARE GRAM POSITIVE COCCI     IN PAIRS IN CLUSTERS   Culture     Final   Value: NORMAL OROPHARYNGEAL FLORA     Performed at [facility]   Report Status 06/06/2014 FINAL   Final  MRSA PCR SCREENING     Status: None   Collection Time    06/08/14  7:20 PM      Result Value Ref Range Status   MRSA by PCR NEGATIVE  NEGATIVE Final   Comment:            The GeneXpert MRSA Assay (FDA     approved for NASAL specimens     only), is one component of a     comprehensive MRSA colonization     surveillance program. It is not     intended to diagnose MRSA     infection nor to guide or     monitor treatment for     MRSA infections.     Studies: Ct Angio Chest W/cm &/or Wo Cm  06/08/2014   CLINICAL DATA:  Initial encounter for severe shortness of breath onset today with chest pain.  EXAM: CT ANGIOGRAPHY CHEST WITH CONTRAST  TECHNIQUE: Multidetector CT imaging of the chest was performed using the standard protocol during bolus administration of intravenous contrast. Multiplanar CT image reconstructions and MIPs were obtained to evaluate the vascular anatomy.  CONTRAST:  60mL OMNIPAQUE IOHEXOL 350 MG/ML SOLN  COMPARISON:  05/22/2014.   FINDINGS: No filling defects within the opacified pulmonary arteries to suggest the presence of an acute pulmonary embolus. Inadequate visualization of the thoracic aorta to allow assessment for dissection. There is no thoracic aortic aneurysm.  No axillary lymphadenopathy. 8 mm short axis right paratracheal lymph node is stable. No hilar or subcarinal lymphadenopathy. Heart is markedly enlarged. Coronary artery calcification is noted. No pericardial effusion.  Lung windows show markedly advanced emphysema bilaterally. There is  posterior right upper lobe and bilateral lower lobe airspace disease, progressed in the interval. Interstitial markings are more prominent than on the previous study. Small bilateral pleural effusions are associated.  Bone windows reveal no worrisome lytic or sclerotic osseous lesions.  Review of the MIP images confirms the above findings.  IMPRESSION: No CT evidence for acute pulmonary embolus.  Interval increase in interstitial opacity with airspace disease in the posterior right upper and posterior bilateral lower lobes. Imaging features may be related to dependent edema in this markedly emphysematous patient although diffuse infection cannot be excluded.   Electronically Signed   By: Kennith CenterEric  Mansell M.D.   On: 06/08/2014 14:43   Dg Chest Portable 1 View  06/08/2014   CLINICAL DATA:  Subsequent encounter for two-month history of hemoptysis  EXAM: PORTABLE CHEST - 1 VIEW  COMPARISON:  05/31/2014.  A 02/17/2014.  FINDINGS: The 1310 hrs. Interval development of interstitial and bilateral lower lobe airspace disease. Small bilateral pleural effusions noted. Cardiopericardial silhouette is at upper limits of normal for size. Bones are diffusely demineralized. Telemetry leads overlie the chest.  IMPRESSION: The interval development of mid and lower lung interstitial disease with basilar alveolar component. Imaging features could be related to the dependent edema in emphysematous patient.  Diffuse infection could also have this appearance.  Stable tiny bilateral pleural effusions.  Underlying chronic interstitial lung disease.   Electronically Signed   By: Kennith CenterEric  Mansell M.D.   On: 06/08/2014 13:30    Scheduled Meds: . carvedilol  3.125 mg Oral BID WC  . ceFEPime (MAXIPIME) IV  1 g Intravenous 3 times per day  . digoxin  0.125 mg Oral Daily  . docusate sodium  100 mg Oral BID  . [START ON 06/10/2014] fentaNYL  25 mcg Transdermal Q72H  . gabapentin  600 mg Oral BID  . guaiFENesin  600 mg Oral BID  . ipratropium  0.5 mg Nebulization QID  . isosorbide mononitrate  60 mg Oral Daily  . levalbuterol  0.63 mg Nebulization Q4H  . levothyroxine  88 mcg Oral QAC breakfast  . lubiprostone  24 mcg Oral BID WC  . mometasone-formoterol  2 puff Inhalation BID  . niacin  500 mg Oral QHS  . pravastatin  20 mg Oral Daily  . predniSONE  20 mg Oral Q breakfast  . saccharomyces boulardii  250 mg Oral BID  . vancomycin  750 mg Intravenous Q12H   Continuous Infusions: . sodium chloride 10 mL/hr at 06/08/14 2056    Time spent: 35 minutes  Braylee Lal L  [facility] Pager [phone]. If 7PM-7AM, please contact night-coverage at [email or web address], password [email or web address] 06/09/2014, 9:32 AM  LOS: 1 day

## 2014-06-09 NOTE — Procedures (Signed)
Objective Swallowing Evaluation: Modified Barium Swallowing Study  Patient Details  Name: Travis SnideGrady Bond MRN: [id number] Date of Birth: 1927-08-22  Today's Date: 06/09/2014 Time: 1205-1235 SLP Time Calculation (min): 30 min  Past Medical History:  Past Medical History  Diagnosis Date  . COPD (chronic obstructive pulmonary disease)   . Chronic respiratory failure with hypoxia   . Pulmonary fibrosis   . Coronary artery disease   . Hypertension   . Hyperlipidemia   . Carotid stenosis   . Peripheral vascular disease   . Anemia   . Peripheral neuropathy   . B12 deficiency   . Pneumonia   . Atrial fibrillation   . Hypothyroidism   . Anxiety state, unspecified   . Unspecified deficiency anemia   . Abdominal aneurysm without mention of rupture   . Aortic valve disorders   . Type II or unspecified type diabetes mellitus without mention of complication, not stated as uncontrolled   . Unspecified hereditary and idiopathic peripheral neuropathy   . Microscopic hematuria   . Chronic kidney disease     Kidney stones   Past Surgical History:  Past Surgical History  Procedure Laterality Date  . Inguinal hernia repair  1994  . Cardioversion  09/21/2011    Procedure: CARDIOVERSION;  Surgeon: Darden PalmerW Spencer Tilley Jr., MD;  [facility];  Service: Cardiovascular;  Laterality: N/A;  . Cardiac stent  2010 or 2011 pt not sure  . Hernia repair  1989   HPI:  80 yo [facility] with respiratory difficulties,found to have worsening pna.  Pt has end stage COPD,  hypothyroid, HCAP (healthcare-associated pneumonia) - now on vanc and cefepime. Recent urine legionella and pneumococcus ag neg for MD note.      Assessment / Plan / Recommendation Clinical Impression  Dysphagia Diagnosis: Mild oral phase dysphagia;Mild pharyngeal phase dysphagia;Suspected primary esophageal dysphagia  Clinical impression: Mild oropharyngeal and suspected primary esophageal dysphagia (known h/o esophageal  dysmotility) noted.  Delayed oral transiting with lingual pumping and premature spillage of liquids into pharynx noted.  Pharyngeal swallow characterized by marked decreased laryngeal elevation/closure allowing trace laryngeal penetration of thin via tsp to vocal cords - reflexive throat clear effective to clear.    Poor laryngeal elevation contributes to decreased liquid clearance into esophagus from pyriform sinus but reflexive dry swallows (x2-3) effective to clear.    Of note, pt became short of breath during testing and fatigue and work of breathing during meals likely allows episodic aspiration.    Barium tablet given with pudding appeared to lodge at mid-proximal esophagus with pt having referrant  sensation to pharynx, liquid swallows facilitated clearance- although with delay.   Appearance of backflow of liquids from distal to proximal esophagus noted without pt sensation which may place him at risk of refluxed material.  Pt further states he has sensation of reflux during his meals at times.      Educated pt to findings using teach back, video, verbal and written reinforcement.  SLP to follow for dysphagia treatment, family/pt education.  Thanks for this order.      Treatment Recommendation  Therapy as outlined in treatment plan below    Diet Recommendation Dysphagia 3 (Mechanical Soft);Thin liquid   Liquid Administration via: Cup Medication Administration: Whole meds with puree (start and follow with liquids) Supervision: Patient able to self feed Compensations: Slow rate;Small sips/bites;Follow solids with liquid (multiple dry swallows with liquids) Postural Changes and/or Swallow Maneuvers: Seated upright 90 degrees;Upright 30-60 min after meal  Other  Recommendations Oral Care Recommendations: Oral care BID   Follow Up Recommendations  Skilled Nursing facility    Frequency and Duration min 2x/week  2 weeks     General Date of Onset: 06/09/14 HPI: 80 yo [facility],found to have worsening pna.  Pt has end stage COPD,  hypothyroid, HCAP (healthcare-associated pneumonia) - now on vanc and cefepime. Recent urine legionella and pneumococcus ag neg for MD note.  Type of Study: Modified Barium Swallowing Study Reason for Referral: Objectively evaluate swallowing function Previous Swallow Assessment: esophagram completed 10/2013 showed dysmotility Diet Prior to this Study: Regular;Thin liquids Temperature Spikes Noted: No Respiratory Status: Nasal cannula (3.5 liters) Behavior/Cognition: Alert;Cooperative;Pleasant mood Oral Cavity - Dentition: Edentulous (pt reports daughter to bring [place] from [place] today, pt uses dentures to eat) Self-Feeding Abilities: Able to feed self Patient Positioning: Upright in chair Baseline Vocal Quality: Hoarse Volitional Cough: Weak Volitional Swallow: Unable to elicit (pt reports due to xerostomia) Anatomy: Within functional limits Pharyngeal Secretions: Not observed secondary MBS    Reason for Referral Objectively evaluate swallowing function   Oral Phase Oral Preparation/Oral Phase Oral Phase: Impaired Oral - Nectar Oral - Nectar Cup: Lingual pumping;Weak lingual manipulation;Piecemeal swallowing Oral - Thin Oral - Thin Teaspoon: Lingual pumping;Weak lingual manipulation Oral - Thin Cup: Lingual pumping;Weak lingual manipulation;Piecemeal swallowing;Left pocketing in lateral sulci;Right pocketing in lateral sulci Oral - Thin Straw: Lingual pumping;Weak lingual manipulation;Piecemeal swallowing Oral - Solids Oral - Puree: Lingual pumping;Weak lingual manipulation Oral - Mechanical Soft: Weak lingual manipulation Oral Phase - Comment Oral Phase - Comment: delayed oral transiting with oral residuals in sulci of liquids, lingual pumping noted across consistencies   Pharyngeal Phase Pharyngeal Phase Pharyngeal Phase: Impaired Pharyngeal - Nectar Pharyngeal - Nectar Cup:  Premature spillage to valleculae;Pharyngeal residue - pyriform sinuses;Reduced airway/laryngeal closure;Reduced laryngeal elevation Pharyngeal - Thin Pharyngeal - Thin Teaspoon: Premature spillage to valleculae;Pharyngeal residue - pyriform sinuses;Reduced airway/laryngeal closure;Reduced laryngeal elevation;Penetration/Aspiration during swallow Penetration/Aspiration details (thin teaspoon): Material enters airway, CONTACTS cords then ejected out Pharyngeal - Thin Cup: Premature spillage to valleculae;Pharyngeal residue - pyriform sinuses;Reduced laryngeal elevation;Reduced airway/laryngeal closure Pharyngeal - Thin Straw: Premature spillage to valleculae;Pharyngeal residue - pyriform sinuses;Reduced laryngeal elevation;Reduced airway/laryngeal closure Pharyngeal - Solids Pharyngeal - Puree: Reduced laryngeal elevation;Reduced airway/laryngeal closure Pharyngeal - Mechanical Soft: Within functional limits Pharyngeal - Pill: Within functional limits (with pudding) Pharyngeal Phase - Comment Pharyngeal Comment: reflexive dry swallows effective to decrease liquid residuals from pharynx  Cervical Esophageal Phase    GO    Cervical Esophageal Phase Cervical Esophageal Phase: Impaired Cervical Esophageal Phase - Comment Cervical Esophageal Comment: barium tablet given with pudding appeared to lodge at mid-proximal esophagus with pt having refferant sensation to pharynx, liquid swallows facilitated clearance- although with delay, appearance of backflow of liquids from distal to proximal esophagus noted without pt sensation which may place him at risk of refluxed material         Travis Burnetamara Navdeep Fessenden, MS [facility] SLP [phone]

## 2014-06-09 NOTE — Progress Notes (Signed)
CRITICAL VALUE ALERT  Critical value received:  Troponin of 0.35 Date of notification:  06/09/2014  Time of notification:  0150  Critical value read back yes  Nurse who received alert:  Anselm LisMelvin kuffour  MD notified (1st page):  Claiborne Billingsallahan  Time of first page:  0200  MD notified (2nd page):  Time of second page:  Responding MD:  Lenny Pastelom callahan  Time MD responded:  [id number]

## 2014-06-10 DIAGNOSIS — J9621 Acute and chronic respiratory failure with hypoxia: Secondary | ICD-10-CM

## 2014-06-10 MED ORDER — CYANOCOBALAMIN 1000 MCG/ML IJ SOLN
1000.0000 ug | Freq: Once | INTRAMUSCULAR | Status: AC
  Administered 2014-06-10: 1000 ug via INTRAMUSCULAR
  Filled 2014-06-10: qty 1

## 2014-06-10 MED ORDER — ALPRAZOLAM 0.25 MG PO TABS
0.2500 mg | ORAL_TABLET | Freq: Two times a day (BID) | ORAL | Status: DC | PRN
  Administered 2014-06-10 – 2014-06-13 (×2): 0.25 mg via ORAL
  Filled 2014-06-10 (×2): qty 1

## 2014-06-10 NOTE — Progress Notes (Signed)
[facility] PROGRESS NOTE  Travis Bond ZOX:[id number] DOB: 08-Oct-1926 DOA: 06/08/2014 PCP: No primary provider on file.  Summary 80 y.o. male with h/o recent admission for CAP and hemoptysis, atrial fibrillation (plavix and warfarin held for hemoptysis), chronic respiratory failure, copd, pulmonary fibrosis, CAD with stent presents to ED with worsening dyspnea. Has had continued hemoptysis, about a tablespoon at a time, as well as purulent sputum. In ED, HR 150, atrial fib. Also, sharp central chest pain alleviated by lying on right side. Usually on 2 liters oxygen, ED staff increased to 6 liters. After 5 mg iv cardizem, HR about 110. CTA chest shows no PE, but does show worsening infiltrate in RUL and bilateral lower lobes. Denies fever, chills. Discharge from hospitalist service 10/5 to SNF. proBNP 2000 (1000) last admission. Speech therapy assessed swallow function at bedside at SNF, and no reported problems. Patient has had pneumonia earlier this year as well   Assessment/Plan:  Principal Problem:   HCAP (healthcare-associated pneumonia): continue vanc and cefepime.  Sputum cx not collected yet. Recent urine legionella and pneumococcus ag neg. -MBS: DYS 3 thin    COPD (chronic obstructive pulmonary disease) without exacerbation. Change xopenex to qid. Continue atrovent. Wean pred   Interstitial pulmonary fibrosis   CAD (coronary artery disease)   Hypothyroidism: TSH 5, but hesitant to increase synthroid with tachycardia.  Recommend repeating in a few months when acute illness resolves   Hemoptysis: secondary to pna. Tapering off. Off warfarin and plavix.   Atrial fibrillation with RVR: rate better   Acute on chronic respiratory failure: usually on 2.5 - 3 liters O2.    Physical deconditioning: PT eval pending. Back to SNF when stable vs CIR single elevated troponin. Likely from pneumonia and a fib RVR. No evidence of ACS Macrocytic anemia: last B12 borderline low. Will give  B12 shots while here    Code Status:  full Family Communication:  Daughter on phone Disposition Plan:  SNF when stable vs CIR  Consultants:    Procedures:     Antibiotics:  Vanc, cefepime 10/8  HPI/Subjective: No cp, no fever, no chills  Objective: Filed Vitals:   06/10/14 0439  BP: 114/76  Pulse: 80  Temp: 98.3 F (36.8 C)  Resp: 18    Intake/Output Summary (Last 24 hours) at 06/10/14 1124 Last data filed at 06/10/14 1100  Gross per 24 hour  Intake    530 ml  Output   1126 ml  Net   -596 ml   Filed Weights   06/08/14 1227 06/08/14 2000  Weight: 68.04 kg (150 lb) 68 kg (149 lb 14.6 oz)     Exam:   General:  Comfortable. Mild increase in work of breathing  Cardiovascular: irreg irreg without MGR  Respiratory: CTA without WRR  Abdomen: S, NT, ND  Ext: no CCE  Basic Metabolic Panel:  Recent Labs Lab 06/08/14 1227  NA 138  K 4.5  CL 97  CO2 31  GLUCOSE 181*  BUN 19  CREATININE 0.77  CALCIUM 8.5   Liver Function Tests:  Recent Labs Lab 06/08/14 1227  AST 27  ALT 32  ALKPHOS 75  BILITOT 1.0  PROT 6.8  ALBUMIN 3.4*   No results found for this basename: LIPASE, AMYLASE,  in the last 168 hours No results found for this basename: AMMONIA,  in the last 168 hours CBC:  Recent Labs Lab 06/03/14 1150 06/08/14 1215 06/09/14 0623  WBC 11.2* 11.7* 6.0  NEUTROABS  --   --  3.7  HGB 9.1* 10.0* 9.1*  HCT 28.9* 32.0* 28.8*  MCV 102.5* 103.9* 106.3*  PLT 214 174 141*   Cardiac Enzymes:  Recent Labs Lab 06/08/14 1240 06/08/14 2021 06/09/14 0016 06/09/14 0623  TROPONINI <0.30 <0.30 0.35* <0.30   BNP (last 3 results)  Recent Labs  05/29/14 1230 06/08/14 1227  PROBNP 1155.0* 2053.0*   CBG: No results found for this basename: GLUCAP,  in the last 168 hours  Recent Results (from the past 240 hour(s))  CULTURE, EXPECTORATED SPUTUM-ASSESSMENT     Status: None   Collection Time    06/04/14  8:00 AM      Result Value Ref  Range Status   Specimen Description SPUTUM   Final   Special Requests NONE   Final   Sputum evaluation     Final   Value: THIS SPECIMEN IS ACCEPTABLE. RESPIRATORY CULTURE REPORT TO FOLLOW.   Report Status 06/04/2014 FINAL   Final  CULTURE, RESPIRATORY (NON-EXPECTORATED)     Status: None   Collection Time    06/04/14  8:00 AM      Result Value Ref Range Status   Specimen Description SPUTUM   Final   Special Requests NONE   Final   Gram Stain     Final   Value: RARE WBC PRESENT, PREDOMINANTLY MONONUCLEAR     RARE SQUAMOUS EPITHELIAL CELLS PRESENT     FEW GRAM NEGATIVE RODS     RARE GRAM POSITIVE COCCI     IN PAIRS IN CLUSTERS   Culture     Final   Value: NORMAL OROPHARYNGEAL FLORA     Performed at [facility]   Report Status 06/06/2014 FINAL   Final  MRSA PCR SCREENING     Status: None   Collection Time    06/08/14  7:20 PM      Result Value Ref Range Status   MRSA by PCR NEGATIVE  NEGATIVE Final   Comment:            The GeneXpert MRSA Assay (FDA     approved for NASAL specimens     only), is one component of a     comprehensive MRSA colonization     surveillance program. It is not     intended to diagnose MRSA     infection nor to guide or     monitor treatment for     MRSA infections.     Studies: Ct Angio Chest W/cm &/or Wo Cm  06/08/2014   CLINICAL DATA:  Initial encounter for severe shortness of breath onset today with chest pain.  EXAM: CT ANGIOGRAPHY CHEST WITH CONTRAST  TECHNIQUE: Multidetector CT imaging of the chest was performed using the standard protocol during bolus administration of intravenous contrast. Multiplanar CT image reconstructions and MIPs were obtained to evaluate the vascular anatomy.  CONTRAST:  60mL OMNIPAQUE IOHEXOL 350 MG/ML SOLN  COMPARISON:  05/22/2014.  FINDINGS: No filling defects within the opacified pulmonary arteries to suggest the presence of an acute pulmonary embolus. Inadequate visualization of the thoracic aorta to allow  assessment for dissection. There is no thoracic aortic aneurysm.  No axillary lymphadenopathy. 8 mm short axis right paratracheal lymph node is stable. No hilar or subcarinal lymphadenopathy. Heart is markedly enlarged. Coronary artery calcification is noted. No pericardial effusion.  Lung windows show markedly advanced emphysema bilaterally. There is posterior right upper lobe and bilateral lower lobe airspace disease, progressed in the interval. Interstitial markings are more prominent than on the previous  study. Small bilateral pleural effusions are associated.  Bone windows reveal no worrisome lytic or sclerotic osseous lesions.  Review of the MIP images confirms the above findings.  IMPRESSION: No CT evidence for acute pulmonary embolus.  Interval increase in interstitial opacity with airspace disease in the posterior right upper and posterior bilateral lower lobes. Imaging features may be related to dependent edema in this markedly emphysematous patient although diffuse infection cannot be excluded.   Electronically Signed   By: Kennith CenterEric  Mansell M.D.   On: 06/08/2014 14:43   Dg Chest Portable 1 View  06/08/2014   CLINICAL DATA:  Subsequent encounter for two-month history of hemoptysis  EXAM: PORTABLE CHEST - 1 VIEW  COMPARISON:  05/31/2014.  A 02/17/2014.  FINDINGS: The 1310 hrs. Interval development of interstitial and bilateral lower lobe airspace disease. Small bilateral pleural effusions noted. Cardiopericardial silhouette is at upper limits of normal for size. Bones are diffusely demineralized. Telemetry leads overlie the chest.  IMPRESSION: The interval development of mid and lower lung interstitial disease with basilar alveolar component. Imaging features could be related to the dependent edema in emphysematous patient. Diffuse infection could also have this appearance.  Stable tiny bilateral pleural effusions.  Underlying chronic interstitial lung disease.   Electronically Signed   By: Kennith CenterEric  Mansell  M.D.   On: 06/08/2014 13:30   Dg Swallowing Func-speech Pathology  06/09/2014   Chales Abrahamsamara Ann Kimball, CCC-SLP     06/09/2014  1:56 PM Objective Swallowing Evaluation: Modified Barium Swallowing Study   Patient Details  Name: Travis SnideGrady Duby MRN: [id number] Date of Birth: 01-10-1927  Today's Date: 06/09/2014 Time: 1205-1235 SLP Time Calculation (min): 30 min  Past Medical History:  Past Medical History  Diagnosis Date  . COPD (chronic obstructive pulmonary disease)   . Chronic respiratory failure with hypoxia   . Pulmonary fibrosis   . Coronary artery disease   . Hypertension   . Hyperlipidemia   . Carotid stenosis   . Peripheral vascular disease   . Anemia   . Peripheral neuropathy   . B12 deficiency   . Pneumonia   . Atrial fibrillation   . Hypothyroidism   . Anxiety state, unspecified   . Unspecified deficiency anemia   . Abdominal aneurysm without mention of rupture   . Aortic valve disorders   . Type II or unspecified type diabetes mellitus without mention  of complication, not stated as uncontrolled   . Unspecified hereditary and idiopathic peripheral neuropathy   . Microscopic hematuria   . Chronic kidney disease     Kidney stones   Past Surgical History:  Past Surgical History  Procedure Laterality Date  . Inguinal hernia repair  1994  . Cardioversion  09/21/2011    Procedure: CARDIOVERSION;  Surgeon: Darden PalmerW Spencer Tilley Jr., MD;   [facility];  Service: Cardiovascular;  Laterality: N/A;  . Cardiac stent  2010 or 2011 pt not sure  . Hernia repair  1989   HPI:  80 [facility],found to  have worsening pna.  Pt has end stage COPD,  hypothyroid, HCAP  (healthcare-associated pneumonia) - now on vanc and cefepime.  Recent urine legionella and pneumococcus ag neg for MD note.      Assessment / Plan / Recommendation Clinical Impression  Dysphagia Diagnosis: Mild oral phase dysphagia;Mild pharyngeal  phase dysphagia;Suspected primary esophageal dysphagia  Clinical  impression: Mild oropharyngeal and suspected primary  esophageal dysphagia (known h/o esophageal dysmotility) noted.   Delayed oral transiting with  lingual pumping and premature  spillage of liquids into pharynx noted.  Pharyngeal swallow  characterized by marked decreased laryngeal elevation/closure  allowing trace laryngeal penetration of thin via tsp to vocal  cords - reflexive throat clear effective to clear.    Poor laryngeal elevation contributes to decreased liquid  clearance into esophagus from pyriform sinus but reflexive dry  swallows (x2-3) effective to clear.    Of note, pt became short of breath during testing and fatigue and  work of breathing during meals likely allows episodic aspiration.     Barium tablet given with pudding appeared to lodge at  mid-proximal esophagus with pt having referrant  sensation to  pharynx, liquid swallows facilitated clearance- although with  delay.   Appearance of backflow of liquids from distal to  proximal esophagus noted without pt sensation which may place him  at risk of refluxed material.  Pt further states he has sensation  of reflux during his meals at times.      Educated pt to findings using teach back, video, verbal and  written reinforcement.  SLP to follow for dysphagia treatment,  family/pt education.  Thanks for this order.      Treatment Recommendation  Therapy as outlined in treatment plan below    Diet Recommendation Dysphagia 3 (Mechanical Soft);Thin liquid   Liquid Administration via: Cup Medication Administration: Whole meds with puree (start and  follow with liquids) Supervision: Patient able to self feed Compensations: Slow rate;Small sips/bites;Follow solids with  liquid (multiple dry swallows with liquids) Postural Changes and/or Swallow Maneuvers: Seated upright 90  degrees;Upright 30-60 min after meal    Other  Recommendations Oral Care Recommendations: Oral care BID   Follow Up Recommendations  Skilled Nursing facility    Frequency and Duration  min 2x/week  2 weeks     General Date of Onset: 06/09/14 HPI: 80 [facility]  [facility]ies,found to have worsening pna.  Pt has end stage COPD,   hypothyroid, HCAP (healthcare-associated pneumonia) - now on  vanc and cefepime. Recent urine legionella and pneumococcus ag  neg for MD note.  Type of Study: Modified Barium Swallowing Study Reason for Referral: Objectively evaluate swallowing function Previous Swallow Assessment: esophagram completed 10/2013 showed  dysmotility Diet Prior to this Study: Regular;Thin liquids Temperature Spikes Noted: No Respiratory Status: Nasal cannula (3.5 liters) Behavior/Cognition: Alert;Cooperative;Pleasant mood Oral Cavity - Dentition: Edentulous (pt reports daughter to bring  [place] dentures from [place] today, pt uses dentures to eat) Self-Feeding Abilities: Able to feed self Patient Positioning: Upright in chair Baseline Vocal Quality: Hoarse Volitional Cough: Weak Volitional Swallow: Unable to elicit (pt reports due to  xerostomia) Anatomy: Within functional limits Pharyngeal Secretions: Not observed secondary MBS    Reason for Referral Objectively evaluate swallowing function   Oral Phase Oral Preparation/Oral Phase Oral Phase: Impaired Oral - Nectar Oral - Nectar Cup: Lingual pumping;Weak lingual  manipulation;Piecemeal swallowing Oral - Thin Oral - Thin Teaspoon: Lingual pumping;Weak lingual manipulation Oral - Thin Cup: Lingual pumping;Weak lingual  manipulation;Piecemeal swallowing;Left pocketing in lateral  sulci;Right pocketing in lateral sulci Oral - Thin Straw: Lingual pumping;Weak lingual  manipulation;Piecemeal swallowing Oral - Solids Oral - Puree: Lingual pumping;Weak lingual manipulation Oral - Mechanical Soft: Weak lingual manipulation Oral Phase - Comment Oral Phase - Comment: delayed oral transiting with oral residuals  in sulci of liquids, lingual pumping noted across consistencies   Pharyngeal Phase Pharyngeal Phase Pharyngeal Phase:  Impaired Pharyngeal - Nectar Pharyngeal - Nectar Cup: Premature spillage  to  valleculae;Pharyngeal residue - pyriform sinuses;Reduced  airway/laryngeal closure;Reduced laryngeal elevation Pharyngeal - Thin Pharyngeal - Thin Teaspoon: Premature spillage to  valleculae;Pharyngeal residue - pyriform sinuses;Reduced  airway/laryngeal closure;Reduced laryngeal  elevation;Penetration/Aspiration during swallow Penetration/Aspiration details (thin teaspoon): Material enters  airway, CONTACTS cords then ejected out Pharyngeal - Thin Cup: Premature spillage to  valleculae;Pharyngeal residue - pyriform sinuses;Reduced  laryngeal elevation;Reduced airway/laryngeal closure Pharyngeal - Thin Straw: Premature spillage to  valleculae;Pharyngeal residue - pyriform sinuses;Reduced  laryngeal elevation;Reduced airway/laryngeal closure Pharyngeal - Solids Pharyngeal - Puree: Reduced laryngeal elevation;Reduced  airway/laryngeal closure Pharyngeal - Mechanical Soft: Within functional limits Pharyngeal - Pill: Within functional limits (with pudding) Pharyngeal Phase - Comment Pharyngeal Comment: reflexive dry swallows effective to decrease  liquid residuals from pharynx  Cervical Esophageal Phase    GO    Cervical Esophageal Phase Cervical Esophageal Phase: Impaired Cervical Esophageal Phase - Comment Cervical Esophageal Comment: barium tablet given with pudding  appeared to lodge at mid-proximal esophagus with pt having  refferant sensation to pharynx, liquid swallows facilitated  clearance- although with delay, appearance of backflow of liquids  from distal to proximal esophagus noted without pt sensation  which may place him at risk of refluxed material         Donavan Burnet, MS [facility] SLP [phone]    Scheduled Meds: . carvedilol  3.125 mg Oral BID WC  . ceFEPime (MAXIPIME) IV  1 g Intravenous 3 times per day  . digoxin  0.125 mg Oral Daily  . diltiazem  30 mg Oral 4 times per day  . docusate sodium  100 mg Oral BID  . fentaNYL   25 mcg Transdermal Q72H  . gabapentin  600 mg Oral BID  . guaiFENesin  600 mg Oral BID  . ipratropium  0.5 mg Nebulization QID  . isosorbide mononitrate  60 mg Oral Daily  . levalbuterol  0.63 mg Nebulization QID  . levothyroxine  88 mcg Oral QAC breakfast  . lubiprostone  24 mcg Oral BID WC  . mometasone-formoterol  2 puff Inhalation BID  . niacin  500 mg Oral QHS  . pravastatin  20 mg Oral Daily  . predniSONE  10 mg Oral Q breakfast  . saccharomyces boulardii  250 mg Oral BID  . vancomycin  750 mg Intravenous Q12H   Continuous Infusions: . sodium chloride 10 mL/hr at 06/08/14 2056    Time spent: 25 minutes  Marlin Canary  [facility] Pager [phone] If 7PM-7AM, please contact night-coverage at [email or web address], password [facility] 06/10/2014, 11:24 AM  LOS: 2 days

## 2014-06-11 LAB — BASIC METABOLIC PANEL
Anion gap: 9 (ref 5–15)
BUN: 24 mg/dL — AB (ref 6–23)
CO2: 30 meq/L (ref 19–32)
Calcium: 8.5 mg/dL (ref 8.4–10.5)
Chloride: 96 mEq/L (ref 96–112)
Creatinine, Ser: 0.86 mg/dL (ref 0.50–1.35)
GFR calc Af Amer: 90 mL/min — ABNORMAL LOW (ref >90)
GFR calc non Af Amer: 77 mL/min — ABNORMAL LOW (ref >90)
GLUCOSE: 107 mg/dL — AB (ref 70–99)
POTASSIUM: 4.9 meq/L (ref 3.7–5.3)
SODIUM: 135 meq/L — AB (ref 137–147)

## 2014-06-11 LAB — CBC
HEMATOCRIT: 30.8 % — AB (ref 39.0–52.0)
HEMOGLOBIN: 9.7 g/dL — AB (ref 13.0–17.0)
MCH: 33.2 pg (ref 26.0–34.0)
MCHC: 31.5 g/dL (ref 30.0–36.0)
MCV: 105.5 fL — AB (ref 78.0–100.0)
Platelets: 146 10*3/uL — ABNORMAL LOW (ref 150–400)
RBC: 2.92 MIL/uL — AB (ref 4.22–5.81)
RDW: 19.9 % — ABNORMAL HIGH (ref 11.5–15.5)
WBC: 9.9 10*3/uL (ref 4.0–10.5)

## 2014-06-11 LAB — VANCOMYCIN, TROUGH
VANCOMYCIN TR: 32.3 ug/mL — AB (ref 10.0–20.0)
Vancomycin Tr: 15.9 ug/mL (ref 10.0–20.0)

## 2014-06-11 MED ORDER — VANCOMYCIN HCL IN DEXTROSE 750-5 MG/150ML-% IV SOLN
750.0000 mg | Freq: Two times a day (BID) | INTRAVENOUS | Status: DC
  Administered 2014-06-11 – 2014-06-12 (×2): 750 mg via INTRAVENOUS
  Filled 2014-06-11 (×3): qty 150

## 2014-06-11 NOTE — Progress Notes (Signed)
Speech Language Pathology Treatment: Dysphagia  Patient Details Name: Travis Bond MRN: [id number] DOB: July 08, 1927 Today's Date: 06/11/2014 Time: 9833-8250 SLP Time Calculation (min): 28 min  Assessment / Plan / Recommendation Clinical Impression  Pt appears to be tolerating current diet well, without overt s/s of aspiration.  Pt is afebrile, LS diminished, no complaints with Dysphagia 3 diet and thin liquids.  SLP reiterated aspiration and reflux precautions, and need to eat slowly, taking frequent rest breaks to "catch" his breath during meal.  Pt agrees to d/c from ST at this time.   HPI HPI: 80 yo male admitted to [facility] with respiratory difficulties,found to have worsening pna.  Pt has end stage COPD,  hypothyroid, HCAP (healthcare-associated pneumonia) - now on vanc and cefepime. Recent urine legionella and pneumococcus ag neg for MD note.    Pertinent Vitals Pain Assessment: No/denies pain  SLP Plan  Discharge SLP treatment due to (comment);All goals met    Recommendations Diet recommendations: Dysphagia 3 (mechanical soft);Thin liquid Liquids provided via: Straw;Cup Medication Administration: Whole meds with puree Supervision: Patient able to self feed Compensations: Slow rate;Small sips/bites;Follow solids with liquid Postural Changes and/or Swallow Maneuvers: Seated upright 90 degrees;Upright 30-60 min after meal              Oral Care Recommendations: Oral care BID Follow up Recommendations: 24 hour supervision/assistance Plan: Discharge SLP treatment due to (comment);All goals met    GO     Quinn Axe T 06/11/2014, 2:14 PM

## 2014-06-11 NOTE — Progress Notes (Signed)
[facility] PROGRESS NOTE  Travis Bond ZOX:[id number] DOB: 14-Mar-1927 DOA: 06/08/2014 PCP: No primary provider on file.  Summary 80 y.o. male with h/o recent admission for CAP and hemoptysis, atrial fibrillation (plavix and warfarin held for hemoptysis), chronic respiratory failure, copd, pulmonary fibrosis, CAD with stent presents to ED with worsening dyspnea. Has had continued hemoptysis, about a tablespoon at a time, as well as purulent sputum. In ED, HR 150, atrial fib. Also, sharp central chest pain alleviated by lying on right side. Usually on 2 liters oxygen, ED staff increased to 6 liters. After 5 mg iv cardizem, HR about 110. CTA chest shows no PE, but does show worsening infiltrate in RUL and bilateral lower lobes. Denies fever, chills. Discharge from hospitalist service 10/5 to SNF. proBNP 2000 (1000) last admission. Speech therapy assessed swallow function at bedside at SNF, and no reported problems. Patient has had pneumonia earlier this year as well   Assessment/Plan:  Principal Problem:   HCAP (healthcare-associated pneumonia): continue vanc and cefepime day #3- change to PO tommorrow  Recent urine legionella and pneumococcus ag neg. -MBS: DYS 3 thin    COPD (chronic obstructive pulmonary disease) without exacerbation. Change xopenex to qid. Continue atrovent. Wean pred   Interstitial pulmonary fibrosis   CAD (coronary artery disease)   Hypothyroidism: TSH 5, but hesitant to increase synthroid with tachycardia.  Recommend repeating in a few months when acute illness resolves   Hemoptysis: secondary to pna. Tapering off. Off warfarin and plavix.   Atrial fibrillation with RVR: rate better   Acute on chronic respiratory failure: usually on 2.5 - 3 liters O2.    Physical deconditioning: PT eval pending. Back to SNF when stable vs CIR single elevated troponin. Likely from pneumonia and a fib RVR. No evidence of ACS Macrocytic anemia: last B12 borderline low. Replace  B12    Code Status:  full Family Communication:  Daughter on phone 10/11 Disposition Plan:  SNF when stable vs CIR if candidate  Consultants:    Procedures:     Antibiotics:  Vanc, cefepime 10/8  HPI/Subjective: Did well overnight  Objective: Filed Vitals:   06/11/14 0643  BP: 105/77  Pulse: 75  Temp: 97.7 F (36.5 C)  Resp:     Intake/Output Summary (Last 24 hours) at 06/11/14 0827 Last data filed at 06/11/14 0700  Gross per 24 hour  Intake    240 ml  Output   1276 ml  Net  -1036 ml   Filed Weights   06/08/14 1227 06/08/14 2000  Weight: 68.04 kg (150 lb) 68 kg (149 lb 14.6 oz)     Exam:   General:  Comfortable. Appears to be breathing better  Cardiovascular: irreg irreg without MGR  Respiratory: CTA without WRR  Abdomen: S, NT, ND  Ext: no CCE  Basic Metabolic Panel:  Recent Labs Lab 06/08/14 1227  NA 138  K 4.5  CL 97  CO2 31  GLUCOSE 181*  BUN 19  CREATININE 0.77  CALCIUM 8.5   Liver Function Tests:  Recent Labs Lab 06/08/14 1227  AST 27  ALT 32  ALKPHOS 75  BILITOT 1.0  PROT 6.8  ALBUMIN 3.4*   No results found for this basename: LIPASE, AMYLASE,  in the last 168 hours No results found for this basename: AMMONIA,  in the last 168 hours CBC:  Recent Labs Lab 06/08/14 1215 06/09/14 0623  WBC 11.7* 6.0  NEUTROABS  --  3.7  HGB 10.0* 9.1*  HCT 32.0* 28.8*  MCV  103.9* 106.3*  PLT 174 141*   Cardiac Enzymes:  Recent Labs Lab 06/08/14 1240 06/08/14 2021 06/09/14 0016 06/09/14 0623  TROPONINI <0.30 <0.30 0.35* <0.30   BNP (last 3 results)  Recent Labs  05/29/14 1230 06/08/14 1227  PROBNP 1155.0* 2053.0*   CBG: No results found for this basename: GLUCAP,  in the last 168 hours  Recent Results (from the past 240 hour(s))  CULTURE, EXPECTORATED SPUTUM-ASSESSMENT     Status: None   Collection Time    06/04/14  8:00 AM      Result Value Ref Range Status   Specimen Description SPUTUM   Final   Special  Requests NONE   Final   Sputum evaluation     Final   Value: THIS SPECIMEN IS ACCEPTABLE. RESPIRATORY CULTURE REPORT TO FOLLOW.   Report Status 06/04/2014 FINAL   Final  CULTURE, RESPIRATORY (NON-EXPECTORATED)     Status: None   Collection Time    06/04/14  8:00 AM      Result Value Ref Range Status   Specimen Description SPUTUM   Final   Special Requests NONE   Final   Gram Stain     Final   Value: RARE WBC PRESENT, PREDOMINANTLY MONONUCLEAR     RARE SQUAMOUS EPITHELIAL CELLS PRESENT     FEW GRAM NEGATIVE RODS     RARE GRAM POSITIVE COCCI     IN PAIRS IN CLUSTERS   Culture     Final   Value: NORMAL OROPHARYNGEAL FLORA     Performed at [facility]   Report Status 06/06/2014 FINAL   Final  CULTURE, BLOOD (ROUTINE X 2)     Status: None   Collection Time    06/08/14  5:33 PM      Result Value Ref Range Status   Specimen Description BLOOD RIGHT FOREARM   Final   Special Requests BOTTLES DRAWN AEROBIC AND ANAEROBIC 5CC   Final   Culture  Setup Time     Final   Value: 06/09/2014 00:31     Performed at [facility]   Culture     Final   Value:        BLOOD CULTURE RECEIVED NO GROWTH TO DATE CULTURE WILL BE HELD FOR 5 DAYS BEFORE ISSUING A FINAL NEGATIVE REPORT     Performed at [facility]   Report Status PENDING   Incomplete  CULTURE, BLOOD (ROUTINE X 2)     Status: None   Collection Time    06/08/14  5:40 PM      Result Value Ref Range Status   Specimen Description BLOOD HAND RIGHT   Final   Special Requests BOTTLES DRAWN AEROBIC AND ANAEROBIC 5CC   Final   Culture  Setup Time     Final   Value: 06/09/2014 00:30     Performed at [facility]   Culture     Final   Value:        BLOOD CULTURE RECEIVED NO GROWTH TO DATE CULTURE WILL BE HELD FOR 5 DAYS BEFORE ISSUING A FINAL NEGATIVE REPORT     Performed at [facility]   Report Status PENDING   Incomplete  MRSA PCR SCREENING     Status: None   Collection Time    06/08/14  7:20 PM       Result Value Ref Range Status   MRSA by PCR NEGATIVE  NEGATIVE Final   Comment:  The GeneXpert MRSA Assay (FDA     approved for NASAL specimens     only), is one component of a     comprehensive MRSA colonization     surveillance program. It is not     intended to diagnose MRSA     infection nor to guide or     monitor treatment for     MRSA infections.     Studies: Dg Swallowing Func-speech Pathology  06/09/2014   Chales Abrahamsamara Ann Kimball, CCC-SLP     06/09/2014  1:56 PM Objective Swallowing Evaluation: Modified Barium Swallowing Study   Patient Details  Name: Travis Bond MRN: [id number] Date of Birth: 1927-05-26  Today's Date: 06/09/2014 Time: 1205-1235 SLP Time Calculation (min): 30 min  Past Medical History:  Past Medical History  Diagnosis Date  . COPD (chronic obstructive pulmonary disease)   . Chronic respiratory failure with hypoxia   . Pulmonary fibrosis   . Coronary artery disease   . Hypertension   . Hyperlipidemia   . Carotid stenosis   . Peripheral vascular disease   . Anemia   . Peripheral neuropathy   . B12 deficiency   . Pneumonia   . Atrial fibrillation   . Hypothyroidism   . Anxiety state, unspecified   . Unspecified deficiency anemia   . Abdominal aneurysm without mention of rupture   . Aortic valve disorders   . Type II or unspecified type diabetes mellitus without mention  of complication, not stated as uncontrolled   . Unspecified hereditary and idiopathic peripheral neuropathy   . Microscopic hematuria   . Chronic kidney disease     Kidney stones   Past Surgical History:  Past Surgical History  Procedure Laterality Date  . Inguinal hernia repair  1994  . Cardioversion  09/21/2011    Procedure: CARDIOVERSION;  Surgeon: Darden PalmerW Spencer Tilley Jr., MD;   [facility];  Service: Cardiovascular;  Laterality: N/A;  . Cardiac stent  2010 or 2011 pt not sure  . Hernia repair  1989   HPI:  80 [facility],found to  have worsening pna.  Pt  has end stage COPD,  hypothyroid, HCAP  (healthcare-associated pneumonia) - now on vanc and cefepime.  Recent urine legionella and pneumococcus ag neg for MD note.      Assessment / Plan / Recommendation Clinical Impression  Dysphagia Diagnosis: Mild oral phase dysphagia;Mild pharyngeal  phase dysphagia;Suspected primary esophageal dysphagia  Clinical impression: Mild oropharyngeal and suspected primary  esophageal dysphagia (known h/o esophageal dysmotility) noted.   Delayed oral transiting with lingual pumping and premature  spillage of liquids into pharynx noted.  Pharyngeal swallow  characterized by marked decreased laryngeal elevation/closure  allowing trace laryngeal penetration of thin via tsp to vocal  cords - reflexive throat clear effective to clear.    Poor laryngeal elevation contributes to decreased liquid  clearance into esophagus from pyriform sinus but reflexive dry  swallows (x2-3) effective to clear.    Of note, pt became short of breath during testing and fatigue and  work of breathing during meals likely allows episodic aspiration.     Barium tablet given with pudding appeared to lodge at  mid-proximal esophagus with pt having referrant  sensation to  pharynx, liquid swallows facilitated clearance- although with  delay.   Appearance of backflow of liquids from distal to  proximal esophagus noted without pt sensation which may place him  at risk of refluxed material.  Pt further states he  has sensation  of reflux during his meals at times.      Educated pt to findings using teach back, video, verbal and  written reinforcement.  SLP to follow for dysphagia treatment,  family/pt education.  Thanks for this order.      Treatment Recommendation  Therapy as outlined in treatment plan below    Diet Recommendation Dysphagia 3 (Mechanical Soft);Thin liquid   Liquid Administration via: Cup Medication Administration: Whole meds with puree (start and  follow with liquids) Supervision: Patient able to self  feed Compensations: Slow rate;Small sips/bites;Follow solids with  liquid (multiple dry swallows with liquids) Postural Changes and/or Swallow Maneuvers: Seated upright 90  degrees;Upright 30-60 min after meal    Other  Recommendations Oral Care Recommendations: Oral care BID   Follow Up Recommendations  Skilled Nursing facility    Frequency and Duration min 2x/week  2 weeks     General Date of Onset: 06/09/14 HPI: 80 yo [facility] with [facility]  difficulties,found to have worsening pna.  Pt has end stage COPD,   hypothyroid, HCAP (healthcare-associated pneumonia) - now on  vanc and cefepime. Recent urine legionella and pneumococcus ag  neg for MD note.  Type of Study: Modified Barium Swallowing Study Reason for Referral: Objectively evaluate swallowing function Previous Swallow Assessment: esophagram completed 10/2013 showed  dysmotility Diet Prior to this Study: Regular;Thin liquids Temperature Spikes Noted: No Respiratory Status: Nasal cannula (3.5 liters) Behavior/Cognition: Alert;Cooperative;Pleasant mood Oral Cavity - Dentition: Edentulous (pt reports daughter to bring  in [facility]ures [facility] today, pt uses dentures to eat) Self-Feeding Abilities: Able to feed self Patient Positioning: Upright in chair Baseline Vocal Quality: Hoarse Volitional Cough: Weak Volitional Swallow: Unable to elicit (pt reports due to  xerostomia) Anatomy: Within functional limits Pharyngeal Secretions: Not observed secondary MBS    Reason for Referral Objectively evaluate swallowing function   Oral Phase Oral Preparation/Oral Phase Oral Phase: Impaired Oral - Nectar Oral - Nectar Cup: Lingual pumping;Weak lingual  manipulation;Piecemeal swallowing Oral - Thin Oral - Thin Teaspoon: Lingual pumping;Weak lingual manipulation Oral - Thin Cup: Lingual pumping;Weak lingual  manipulation;Piecemeal swallowing;Left pocketing in lateral  sulci;Right pocketing in lateral sulci Oral - Thin Straw: Lingual pumping;Weak lingual   manipulation;Piecemeal swallowing Oral - Solids Oral - Puree: Lingual pumping;Weak lingual manipulation Oral - Mechanical Soft: Weak lingual manipulation Oral Phase - Comment Oral Phase - Comment: delayed oral transiting with oral residuals  in sulci of liquids, lingual pumping noted across consistencies   Pharyngeal Phase Pharyngeal Phase Pharyngeal Phase: Impaired Pharyngeal - Nectar Pharyngeal - Nectar Cup: Premature spillage to  valleculae;Pharyngeal residue - pyriform sinuses;Reduced  airway/laryngeal closure;Reduced laryngeal elevation Pharyngeal - Thin Pharyngeal - Thin Teaspoon: Premature spillage to  valleculae;Pharyngeal residue - pyriform sinuses;Reduced  airway/laryngeal closure;Reduced laryngeal  elevation;Penetration/Aspiration during swallow Penetration/Aspiration details (thin teaspoon): Material enters  airway, CONTACTS cords then ejected out Pharyngeal - Thin Cup: Premature spillage to  valleculae;Pharyngeal residue - pyriform sinuses;Reduced  laryngeal elevation;Reduced airway/laryngeal closure Pharyngeal - Thin Straw: Premature spillage to  valleculae;Pharyngeal residue - pyriform sinuses;Reduced  laryngeal elevation;Reduced airway/laryngeal closure Pharyngeal - Solids Pharyngeal - Puree: Reduced laryngeal elevation;Reduced  airway/laryngeal closure Pharyngeal - Mechanical Soft: Within functional limits Pharyngeal - Pill: Within functional limits (with pudding) Pharyngeal Phase - Comment Pharyngeal Comment: reflexive dry swallows effective to decrease  liquid residuals from pharynx  Cervical Esophageal Phase    GO    Cervical Esophageal Phase Cervical Esophageal Phase: Impaired Cervical Esophageal Phase - Comment Cervical Esophageal Comment: barium tablet  given with pudding  appeared to lodge at mid-proximal esophagus with pt having  refferant sensation to pharynx, liquid swallows facilitated  clearance- although with delay, appearance of backflow of liquids  from distal to proximal esophagus  noted without pt sensation  which may place him at risk of refluxed material         Donavan Burnet, MS [facility] SLP [phone]    Scheduled Meds: . carvedilol  3.125 mg Oral BID WC  . ceFEPime (MAXIPIME) IV  1 g Intravenous 3 times per day  . digoxin  0.125 mg Oral Daily  . diltiazem  30 mg Oral 4 times per day  . docusate sodium  100 mg Oral BID  . fentaNYL  25 mcg Transdermal Q72H  . gabapentin  600 mg Oral BID  . guaiFENesin  600 mg Oral BID  . ipratropium  0.5 mg Nebulization QID  . isosorbide mononitrate  60 mg Oral Daily  . levalbuterol  0.63 mg Nebulization QID  . levothyroxine  88 mcg Oral QAC breakfast  . lubiprostone  24 mcg Oral BID WC  . mometasone-formoterol  2 puff Inhalation BID  . niacin  500 mg Oral QHS  . pravastatin  20 mg Oral Daily  . predniSONE  10 mg Oral Q breakfast  . saccharomyces boulardii  250 mg Oral BID  . vancomycin  750 mg Intravenous Q12H   Continuous Infusions: . sodium chloride 10 mL/hr at 06/08/14 2056    Time spent: 25 minutes  Marlin Canary  [facility] Pager [phone] If 7PM-7AM, please contact night-coverage at [email or web address], password [facility] 06/11/2014, 8:27 AM  LOS: 3 days

## 2014-06-11 NOTE — Progress Notes (Addendum)
ANTIBIOTIC CONSULT NOTE - FOLLOW UP  Pharmacy Consult for Vancomycin and Cefepime Indication: pneumonia  No Known Allergies  Patient Measurements: Height: 5\' 8"  (172.7 cm) Weight: 149 lb 14.6 oz (68 kg) IBW/kg (Calculated) : 68.4  Vital Signs: Temp: 97.7 F (36.5 C) (10/12 0643) Temp Source: Oral (10/12 0643) BP: 105/77 mmHg (10/12 0643) Pulse Rate: 75 (10/12 0643) Intake/Output from previous day: 10/11 0701 - 10/12 0700 In: 480 [P.O.:480] Out: 1276 [Urine:1275; Stool:1] Intake/Output from this shift: Total I/O In: -  Out: 150 [Urine:150]  Labs:  Recent Labs  06/08/14 1215 06/08/14 1227 06/09/14 0623  WBC 11.7*  --  6.0  HGB 10.0*  --  9.1*  PLT 174  --  141*  CREATININE  --  0.77  --    Estimated Creatinine Clearance: 66.1 ml/min (by C-G formula based on Cr of 0.77).  Recent Labs  06/11/14 0601  VANCOTROUGH 32.3*    Assessment:   Day # 4 Vancomycin and Cefepime for pneumonia.   Vancomycin trough level this am is supratherapeutic (32.2 mcg/ml), but appears to have been drawn while dose was infusing.  Last bmet 06/08/14, good UOP.  Afebrile, last WBC 6.0 on 10/10.  Noted plan to change to PO antibiotics in am.  But, on q12h Vancomycin regimen, so will re-check trough level prior to 6pm dose, along with bmet and CBC.  Hold Vancomycin dose until level resulted and reviewed.  Goal of Therapy:  Vancomycin trough level 15-20 mcg/ml appropriate Cefepime dose for renal function and infection  Plan:   Vancomycin trough level at 5:30pm (prior to 6pm dose).  Bmet and CBC with same blood draw.  Continue Vancomycin 750 mg IV q12hrs for now.  Continue Cefepime 1 gram IV q8hrs.  Dennie Fettersgan, Theresa Donovan, [place] Pager: [phone] 06/11/2014,11:42 AM  06/11/2014 7:04 PM Vancomycin trough reported at goal on 750 mg IV q12h, will resume this regimen and follow up trough at steady state or unless otherwise clinically indicated.  Wava Kildow Christine Virginia CrewsBates Candy Leverett

## 2014-06-11 NOTE — Clinical Social Work Psychosocial (Signed)
Clinical Social Work Department BRIEF PSYCHOSOCIAL ASSESSMENT 06/11/2014  [facility]     Account Number:  [id number]     Admit date:  06/08/2014  Clinical Social Worker:  Elouise MunroeANTERHAUS,Niurka Benecke, LCSWA  Date/Time:  06/11/2014 04:46 PM  Referred by:  Physician  Date Referred:  06/11/2014 Referred for  SNF Placement   Other Referral:   Interview type:  Patient Other interview type:   Daughter Travis Bond was spoken to on the phone.    PSYCHOSOCIAL DATA Living Status:  FACILITY [facility] Level of care:  Skilled Nursing Facility Primary support name:   Primary support relationship to patient:  FAMILY Degree of support available:   Patient has good support from his daughter Travis Bond who helps advocate for him.    CURRENT CONCERNS Current Concerns  Post-Acute Placement   Other Concerns:    SOCIAL WORK ASSESSMENT / PLAN Patient is an 80 year old male who was at a SNF for rehab. Patient was admitted to the hospital on 06-08-2014.  Patient was living with his daughter in her home before his last hospitalization.  Patient is alert and oriented x4 and states he would like to have inpatient rehab if possible but is in agreement for a SNF as another option.  Patient and daughter are in agreement to attend a different SNF. Patient and daughter do not want to go back to [facility]. Patient was talkative and positive about going to SNF for rehab.  Patient is aware that he needs some rehab, [place]   Assessment/plan status:  Psychosocial Support/Ongoing Assessment of Needs Other assessment/ plan:   Information/referral to community resources:    PATIENT'S/FAMILY'S RESPONSE TO PLAN OF CARE: Patient and daughter in agreement to look for a different SNF for rehab.   Travis Bond, MSW, Theresia MajorsLCSWA [phone] 06/11/2014 5:06 PM

## 2014-06-11 NOTE — Progress Notes (Signed)
Thank you for consult on Mr. Travis Bond. He was admitted from [facility] with worsening of HCAP and PT evaluation done yesterday shows that he is min guard assist for activity. He lives alone and would recommend returning to SNF to complete his rehab stay. Will defer CIR consult for now.

## 2014-06-11 NOTE — Progress Notes (Signed)
Rehab Admissions Coordinator Note:  Patient was screened by Shandria Clinch L for appropriateness for an Inpatient Acute Rehab Consult.  At this time, we are recommending Inpatient Rehab consult.  Ellenora Talton L 06/11/2014, 9:15 AM  I can be reached at [phone].

## 2014-06-12 ENCOUNTER — Telehealth: Payer: Self-pay | Admitting: Pulmonary Disease

## 2014-06-12 LAB — BASIC METABOLIC PANEL
Anion gap: 6 (ref 5–15)
BUN: 26 mg/dL — AB (ref 6–23)
CO2: 34 mEq/L — ABNORMAL HIGH (ref 19–32)
CREATININE: 0.9 mg/dL (ref 0.50–1.35)
Calcium: 8.7 mg/dL (ref 8.4–10.5)
Chloride: 96 mEq/L (ref 96–112)
GFR, EST AFRICAN AMERICAN: 88 mL/min — AB (ref >90)
GFR, EST NON AFRICAN AMERICAN: 76 mL/min — AB (ref >90)
Glucose, Bld: 114 mg/dL — ABNORMAL HIGH (ref 70–99)
Potassium: 4.7 mEq/L (ref 3.7–5.3)
Sodium: 136 mEq/L — ABNORMAL LOW (ref 137–147)

## 2014-06-12 LAB — CBC
HEMATOCRIT: 30.6 % — AB (ref 39.0–52.0)
HEMOGLOBIN: 9.3 g/dL — AB (ref 13.0–17.0)
MCH: 32.1 pg (ref 26.0–34.0)
MCHC: 30.4 g/dL (ref 30.0–36.0)
MCV: 105.5 fL — AB (ref 78.0–100.0)
Platelets: 145 10*3/uL — ABNORMAL LOW (ref 150–400)
RBC: 2.9 MIL/uL — ABNORMAL LOW (ref 4.22–5.81)
RDW: 19.8 % — AB (ref 11.5–15.5)
WBC: 8.3 10*3/uL (ref 4.0–10.5)

## 2014-06-12 MED ORDER — LEVOFLOXACIN 750 MG PO TABS
750.0000 mg | ORAL_TABLET | Freq: Every day | ORAL | Status: DC
  Administered 2014-06-12 – 2014-06-13 (×2): 750 mg via ORAL
  Filled 2014-06-12 (×2): qty 1

## 2014-06-12 MED ORDER — ENSURE COMPLETE PO LIQD
237.0000 mL | Freq: Two times a day (BID) | ORAL | Status: DC
  Administered 2014-06-12 – 2014-06-13 (×2): 237 mL via ORAL

## 2014-06-12 NOTE — Telephone Encounter (Signed)
Called # provided. They are now close. Will need to call back tomorrow.

## 2014-06-12 NOTE — Clinical Social Work Placement (Addendum)
Clinical Social Work Department CLINICAL SOCIAL WORK PLACEMENT NOTE 06/13/2014  [facility]  [facility]  Account Number:  [id number] Admit date:  06/08/2014  Clinical Social Worker:  ERIC ANTERHAUS, LCSWA  Date/time:  06/11/2014 05:09 PM  Clinical Social Work is seeking post-discharge placement for this patient at the following level of care:   SKILLED NURSING   (*CSW will update this form in Epic as items are completed)   06/11/2014  Patient/family provided with [facility] Department of Clinical Social Work's list of facilities offering this level of care within the geographic area requested by the patient (or if unable, by the patient's family).  06/11/2014  Patient/family informed of their freedom to choose among providers that offer the needed level of care, that participate in Medicare, Medicaid or managed care program needed by the patient, have an available bed and are willing to accept the patient.  06/11/2014  Patient/family informed of [facility]' ownership interest in [facility], as well as of the fact that they are under no obligation to receive care at this facility.  PASARR submitted to EDS on 06/11/2014 PASARR number received on 06/11/2014  FL2 transmitted to all facilities in geographic area requested by pt/family on  06/11/2014 FL2 transmitted to all facilities within larger geographic area on 06/11/2014  Patient informed that his/her managed care company has contracts with or will negotiate with  certain facilities, including the following:     Patient/family informed of bed offers received:  06/12/14 [facility]ician recommends and patient chooses bed at    [facility]  on  06/13/14 Patient to be transferred to facility by EMS Patient and family notified of transfer on 06/13/14 Name of family member notified:  Daughter, [name]  The following physician request were entered in  Epic:   Additional Comments:  Reece LevyJanet Isaura Schiller, MSW, Theresia MajorsLCSWA [phone]

## 2014-06-12 NOTE — Clinical Social Work Note (Signed)
Contacted patient and patient daughter [name] in regards to bed offers.  Patient and daughter received bed offers and chose to go with [facility].  Contacted [facility] to confirm bed placement offer, [facility] said yes they do have a bed available for tomorrow.  [facility] stated they will contact daughter to fill out paperwork, patient and daughter notified that he may be discharging tomorrow according to doctor, pending discharge orders.  Ervin KnackEric R. Ira Busbin, MSW, Theresia MajorsLCSWA [phone] 06/12/2014 1:55 PM

## 2014-06-12 NOTE — Progress Notes (Signed)
ANTIBIOTIC CONSULT NOTE - FOLLOW UP  Pharmacy Consult for Levaquin PO Indication: HCAP  No Known Allergies  Patient Measurements: Height: 5\' 8"  (172.7 cm) Weight: 149 lb 14.6 oz (68 kg) IBW/kg (Calculated) : 68.4  Vital Signs: Temp: 98.6 F (37 C) (10/13 0532) Temp Source: Oral (10/13 0532) BP: 129/70 mmHg (10/13 1155) Pulse Rate: 85 (10/13 1155) Intake/Output from previous day: 10/12 0701 - 10/13 0700 In: 630 [P.O.:480; IV Piggyback:150] Out: 650 [Urine:650] Intake/Output from this shift: Total I/O In: 480 [P.O.:480] Out: 225 [Urine:225]  Labs:  Recent Labs  06/11/14 1738 06/12/14 0020  WBC 9.9 8.3  HGB 9.7* 9.3*  PLT 146* 145*  CREATININE 0.86 0.90   Estimated Creatinine Clearance: 58.8 ml/min (by C-G formula based on Cr of 0.9).  Recent Labs  06/11/14 0601 06/11/14 1738  VANCOTROUGH 32.3* 15.9    Assessment:  Day # 5 Vancomycin and Cefepime for HCAP.  Changing to Levaquin PO today.  Goal of Therapy:  appropriate Levaquin dose for renal function and infection  Plan:   Levaquin 750 mg PO daily.  Dennie FettersEgan, Amere Iott Donovan, RPh Pager: [phone] 06/12/2014,1:26 PM

## 2014-06-12 NOTE — Care Management Note (Signed)
    Page 1 of 1   06/13/2014     3:24:24 PM CARE MANAGEMENT NOTE 06/13/2014  [facility]  [facility]   Account Number:  [id number]  Date Initiated:  06/11/2014  Documentation initiated by:  Solmon Bohr  Subjective/Objective Assessment:   Pt adm on 06/08/14 with PNA.  PTA, [facility].     Action/Plan:   Pt does not want to return to [facility] at dc.  [facility].  CSW following to facilitate dc to SNF when medically stable.   Anticipated DC Date:  06/13/2014   Anticipated DC Plan:  SKILLED NURSING FACILITY  In-house referral  Clinical Social Worker      DC Planning Services  CM consult      Choice offered to / List presented to:             Status of service:  Completed, signed off Medicare Important Message given?  YES (If response is "NO", the following Medicare IM given date fields will be blank) Date Medicare IM given:  06/11/2014 Medicare IM given by:  Dene Landsberg Date Additional Medicare IM given:   Additional Medicare IM given by:    Discharge Disposition:  SKILLED NURSING FACILITY  Per UR Regulation:  Reviewed for med. necessity/level of care/duration of stay  If discussed at Long Length of Stay Meetings, dates discussed:    Comments:  06/13/14 Sidney AceJulie Sharice Harriss, RN, BSN [phone] Pt discharging to SNF today, per CSW arrangments.

## 2014-06-12 NOTE — Progress Notes (Signed)
[facility] PROGRESS NOTE  Travis Bond RUE:[id number] DOB: 02/01/1927 DOA: 06/08/2014 PCP: No primary provider on file.  Summary 80 y.o. male with h/o recent admission for CAP and hemoptysis, atrial fibrillation (plavix and warfarin held for hemoptysis), chronic respiratory failure, copd, pulmonary fibrosis, CAD with stent presents to ED with worsening dyspnea. Has had continued hemoptysis, about a tablespoon at a time, as well as purulent sputum. In ED, HR 150, atrial fib. Also, sharp central chest pain alleviated by lying on right side. Usually on 2 liters oxygen, ED staff increased to 6 liters. After 5 mg iv cardizem, HR about 110. CTA chest shows no PE, but does show worsening infiltrate in RUL and bilateral lower lobes. Denies fever, chills. Discharge from hospitalist service 10/5 to SNF. proBNP 2000 (1000) last admission. Speech therapy assessed swallow function at bedside at SNF, and no reported problems. Patient has had pneumonia earlier this year as well   Assessment/Plan:  Principal Problem:   HCAP (healthcare-associated pneumonia): continue vanc and cefepime day #4- change to PO   Recent urine legionella and pneumococcus ag neg. -MBS: DYS 3 thin    COPD (chronic obstructive pulmonary disease) without exacerbation. Change xopenex to qid. Continue atrovent. Wean pred   Interstitial pulmonary fibrosis   CAD (coronary artery disease)   Hypothyroidism: TSH 5, but hesitant to increase synthroid with tachycardia.  Recommend repeating in a few months when acute illness resolves   Hemoptysis: secondary to pna. Tapering off. Off warfarin and plavix.   Atrial fibrillation with RVR: rate better   Acute on chronic respiratory failure: usually on 2.5 - 3 liters O2.    Physical deconditioning: PT eval pending. Back to SNF when stable vs CIR single elevated troponin. Likely from pneumonia and a fib RVR. No evidence of ACS Macrocytic anemia: last B12 borderline low. Replace  B12    Code Status:  full Family Communication:  Daughter on phone 10/13 Disposition Plan:  SNF tomm?  Consultants:    Procedures:     Antibiotics:  Vanc, cefepime 10/8  HPI/Subjective: Feeling better  Objective: Filed Vitals:   06/12/14 1155  BP: 129/70  Pulse: 85  Temp:   Resp:     Intake/Output Summary (Last 24 hours) at 06/12/14 1214 Last data filed at 06/12/14 0745  Gross per 24 hour  Intake    630 ml  Output    725 ml  Net    -95 ml   Filed Weights   06/08/14 1227 06/08/14 2000  Weight: 68.04 kg (150 lb) 68 kg (149 lb 14.6 oz)     Exam:   General:  Comfortable. No increase work of breathing  Cardiovascular: irreg irreg without MGR  Respiratory: CTA without WRR  Abdomen: S, NT, ND  Ext: no CCE  Basic Metabolic Panel:  Recent Labs Lab 06/08/14 1227 06/11/14 1738 06/12/14 0020  NA 138 135* 136*  K 4.5 4.9 4.7  CL 97 96 96  CO2 31 30 34*  GLUCOSE 181* 107* 114*  BUN 19 24* 26*  CREATININE 0.77 0.86 0.90  CALCIUM 8.5 8.5 8.7   Liver Function Tests:  Recent Labs Lab 06/08/14 1227  AST 27  ALT 32  ALKPHOS 75  BILITOT 1.0  PROT 6.8  ALBUMIN 3.4*   No results found for this basename: LIPASE, AMYLASE,  in the last 168 hours No results found for this basename: AMMONIA,  in the last 168 hours CBC:  Recent Labs Lab 06/08/14 1215 06/09/14 0623 06/11/14 1738 06/12/14 0020  WBC  11.7* 6.0 9.9 8.3  NEUTROABS  --  3.7  --   --   HGB 10.0* 9.1* 9.7* 9.3*  HCT 32.0* 28.8* 30.8* 30.6*  MCV 103.9* 106.3* 105.5* 105.5*  PLT 174 141* 146* 145*   Cardiac Enzymes:  Recent Labs Lab 06/08/14 1240 06/08/14 2021 06/09/14 0016 06/09/14 0623  TROPONINI <0.30 <0.30 0.35* <0.30   BNP (last 3 results)  Recent Labs  05/29/14 1230 06/08/14 1227  PROBNP 1155.0* 2053.0*   CBG: No results found for this basename: GLUCAP,  in the last 168 hours  Recent Results (from the past 240 hour(s))  CULTURE, EXPECTORATED SPUTUM-ASSESSMENT      Status: None   Collection Time    06/04/14  8:00 AM      Result Value Ref Range Status   Specimen Description SPUTUM   Final   Special Requests NONE   Final   Sputum evaluation     Final   Value: THIS SPECIMEN IS ACCEPTABLE. RESPIRATORY CULTURE REPORT TO FOLLOW.   Report Status 06/04/2014 FINAL   Final  CULTURE, RESPIRATORY (NON-EXPECTORATED)     Status: None   Collection Time    06/04/14  8:00 AM      Result Value Ref Range Status   Specimen Description SPUTUM   Final   Special Requests NONE   Final   Gram Stain     Final   Value: RARE WBC PRESENT, PREDOMINANTLY MONONUCLEAR     RARE SQUAMOUS EPITHELIAL CELLS PRESENT     FEW GRAM NEGATIVE RODS     RARE GRAM POSITIVE COCCI     IN PAIRS IN CLUSTERS   Culture     Final   Value: NORMAL OROPHARYNGEAL FLORA     Performed at [facility]   Report Status 06/06/2014 FINAL   Final  CULTURE, BLOOD (ROUTINE X 2)     Status: None   Collection Time    06/08/14  5:33 PM      Result Value Ref Range Status   Specimen Description BLOOD RIGHT FOREARM   Final   Special Requests BOTTLES DRAWN AEROBIC AND ANAEROBIC 5CC   Final   Culture  Setup Time     Final   Value: 06/09/2014 00:31     Performed at [facility]   Culture     Final   Value:        BLOOD CULTURE RECEIVED NO GROWTH TO DATE CULTURE WILL BE HELD FOR 5 DAYS BEFORE ISSUING A FINAL NEGATIVE REPORT     Performed at [facility]   Report Status PENDING   Incomplete  CULTURE, BLOOD (ROUTINE X 2)     Status: None   Collection Time    06/08/14  5:40 PM      Result Value Ref Range Status   Specimen Description BLOOD HAND RIGHT   Final   Special Requests BOTTLES DRAWN AEROBIC AND ANAEROBIC 5CC   Final   Culture  Setup Time     Final   Value: 06/09/2014 00:30     Performed at [facility]   Culture     Final   Value:        BLOOD CULTURE RECEIVED NO GROWTH TO DATE CULTURE WILL BE HELD FOR 5 DAYS BEFORE ISSUING A FINAL NEGATIVE REPORT     Performed at  [facility]   Report Status PENDING   Incomplete  MRSA PCR SCREENING     Status: None   Collection Time  06/08/14  7:20 PM      Result Value Ref Range Status   MRSA by PCR NEGATIVE  NEGATIVE Final   Comment:            The GeneXpert MRSA Assay (FDA     approved for NASAL specimens     only), is one component of a     comprehensive MRSA colonization     surveillance program. It is not     intended to diagnose MRSA     infection nor to guide or     monitor treatment for     MRSA infections.     Studies: No results found.  Scheduled Meds: . carvedilol  3.125 mg Oral BID WC  . ceFEPime (MAXIPIME) IV  1 g Intravenous 3 times per day  . digoxin  0.125 mg Oral Daily  . diltiazem  30 mg Oral 4 times per day  . docusate sodium  100 mg Oral BID  . feeding supplement (ENSURE COMPLETE)  237 mL Oral BID BM  . fentaNYL  25 mcg Transdermal Q72H  . gabapentin  600 mg Oral BID  . guaiFENesin  600 mg Oral BID  . ipratropium  0.5 mg Nebulization QID  . isosorbide mononitrate  60 mg Oral Daily  . levalbuterol  0.63 mg Nebulization QID  . levothyroxine  88 mcg Oral QAC breakfast  . lubiprostone  24 mcg Oral BID WC  . mometasone-formoterol  2 puff Inhalation BID  . niacin  500 mg Oral QHS  . pravastatin  20 mg Oral Daily  . predniSONE  10 mg Oral Q breakfast  . saccharomyces boulardii  250 mg Oral BID  . vancomycin  750 mg Intravenous Q12H   Continuous Infusions: . sodium chloride 10 mL/hr at 06/12/14 0829    Time spent: 25 minutes  Marlin CanaryVANN, Khaden Gater  [facility] Pager [phone] If 7PM-7AM, please contact night-coverage at [email or web address], password [email or web address] 06/12/2014, 12:14 PM  LOS: 4 days

## 2014-06-12 NOTE — Progress Notes (Signed)
Physical Therapy Treatment Patient Details Name: Samara SnideGrady Marmolejos MRN: [id number] DOB: 05/02/1927 Today's Date: 06/12/2014    History of Present Illness 80 [facility],found to have worsening pna.  Pt has end stage COPD,  hypothyroid, HCAP (healthcare-associated pneumonia    PT Comments    Pt progressing with gait and HEP but continues to require cues for all mobility and unable to extend trunk or perform pursed lip breathing without cueing. Pt was able to perform pericare without assist. Pt encouraged to continue mobility with nursing and continue HEP. Will follow.   Follow Up Recommendations  SNF;Supervision for mobility/OOB     Equipment Recommendations       Recommendations for Other Services       Precautions / Restrictions Precautions Precautions: Fall Precaution Comments: watch 02 sats Restrictions Weight Bearing Restrictions: No    Mobility  Bed Mobility               General bed mobility comments: pt in recliner on arrival and denied bed end of session  Transfers Overall transfer level: Needs assistance     Sit to Stand: Min assist;Supervision         General transfer comment: cues for hand placement from toilet and chair, assist to stand from low commode with use of rail  Ambulation/Gait Ambulation/Gait assistance: Min guard Ambulation Distance (Feet): 125 Feet Assistive device: Rolling walker (2 wheeled) Gait Pattern/deviations: Step-through pattern;Decreased stride length;Trunk flexed   Gait velocity interpretation: Below normal speed for age/gender General Gait Details: pt maintains kyphotic posture with rounded shoulders and forward head throughout despite cues for scapular retraction, trunk extension, looking up and pursed lip breathing as well as position in RW   Stairs Stairs:  (pt denied attempting due to fatigue)          Wheelchair Mobility    Modified Rankin (Stroke Patients Only)       Balance Overall balance assessment: Needs assistance   Sitting balance-Leahy Scale: Good       Standing balance-Leahy Scale: Fair                      Cognition Arousal/Alertness: Awake/alert Behavior During Therapy: Flat affect Overall Cognitive Status: Within Functional Limits for tasks assessed                      Exercises General Exercises - Lower Extremity Long Arc Quad: AROM;Seated;Both;20 reps Hip ABduction/ADduction: AROM;Seated;Both;20 reps Hip Flexion/Marching: AROM;Seated;Both;20 reps    General Comments        Pertinent Vitals/Pain Pain Assessment: No/denies pain sats dropping to 88% on 3L with gait    Home Living                      Prior Function            PT Goals (current goals can now be found in the care plan section) Progress towards PT goals: Progressing toward goals    Frequency       PT Plan Discharge plan needs to be updated    Co-evaluation             End of Session Equipment Utilized During Treatment: Oxygen Activity Tolerance: Patient tolerated treatment well Patient left: in chair;with call bell/phone within reach     Time: 1000-1029 PT Time Calculation (min): 29 min  Charges:  $Gait Training: 8-22 mins $Therapeutic Exercise: 8-22 mins  G CodesToney Sang:      Tabor, Tashawnda Bleiler Beth 06/12/2014, 10:32 AM Delaney MeigsMaija Tabor Anayeli Arel, PT [phone]

## 2014-06-13 ENCOUNTER — Other Ambulatory Visit: Payer: Self-pay | Admitting: *Deleted

## 2014-06-13 DIAGNOSIS — J9611 Chronic respiratory failure with hypoxia: Secondary | ICD-10-CM

## 2014-06-13 MED ORDER — FENTANYL 25 MCG/HR TD PT72: 25.0000 ug | MEDICATED_PATCH | TRANSDERMAL | Status: DC

## 2014-06-13 MED ORDER — DILTIAZEM HCL 30 MG PO TABS: 30.0000 mg | ORAL_TABLET | Freq: Four times a day (QID) | ORAL | Status: DC

## 2014-06-13 MED ORDER — HYDROCODONE-ACETAMINOPHEN 5-325 MG PO TABS: 1.0000 | ORAL_TABLET | ORAL | Status: DC | PRN

## 2014-06-13 MED ORDER — IPRATROPIUM BROMIDE 0.02 % IN SOLN: 0.5000 mg | Freq: Four times a day (QID) | RESPIRATORY_TRACT | Status: DC

## 2014-06-13 MED ORDER — LEVOFLOXACIN 750 MG PO TABS: 750.0000 mg | ORAL_TABLET | Freq: Every day | ORAL | Status: DC

## 2014-06-13 MED ORDER — ALPRAZOLAM 0.25 MG PO TABS: 0.2500 mg | ORAL_TABLET | Freq: Two times a day (BID) | ORAL | Status: DC | PRN

## 2014-06-13 MED ORDER — PREDNISONE 10 MG PO TABS: 10.0000 mg | ORAL_TABLET | Freq: Every day | ORAL | Status: DC

## 2014-06-13 MED ORDER — LEVALBUTEROL HCL 0.63 MG/3ML IN NEBU: 0.6300 mg | INHALATION_SOLUTION | Freq: Four times a day (QID) | RESPIRATORY_TRACT | Status: DC

## 2014-06-13 NOTE — Discharge Summary (Signed)
Physician Discharge Summary  Travis Bond Candella ZOX:[id number] DOB: Aug 14, 1927 DOA: 06/08/2014  PCP: No primary provider on file.  Admit date: 06/08/2014 Discharge date: 06/13/2014  Time spent: 35 minutes  Recommendations for Outpatient Follow-up:  To [facility] for 3 more days Once pneumonia treated, resume coumadin for a fib- needs to follow up with Dr. Donnie Ahoilley Please be sure patient gets breathing treatments EVERY 4 hours ATC O2- 2.5-3.5L Wean off steroids Repeat TSH, free t4 when no longer sick  Discharge Diagnoses:  Principal Problem:   HCAP (healthcare-associated pneumonia) Active Problems:   COPD (chronic obstructive pulmonary disease)   Interstitial pulmonary fibrosis   CAD (coronary artery disease)   Hypothyroidism   Hemoptysis   Atrial fibrillation with RVR   Acute on chronic respiratory failure   Physical deconditioning   Macrocytic anemia   Elevated troponin   Discharge Condition: improved  Diet recommendation: DYS 3 thin-- ground meats, extra sauce and gravy  Filed Weights   06/08/14 1227 06/08/14 2000 06/13/14 0452  Weight: 68.04 kg (150 lb) 68 kg (149 lb 14.6 oz) 64.1 kg (141 lb 5 oz)    History of present illness:  Travis Bond Osbourn is a 80 y.o. male  With h/o recent admission for CAP and hemoptysis, atrial fibrillation (plavix and warfarin held for hemoptysis), chronic respiratory failure, copd, pulmonary fibrosis, CAD with stent presents to ED with worsening dyspnea. Has had continued hemoptysis, about a tablespoon at a time, as well as purulent sputum. In ED, HR 150, atrial fib. Also, sharp central chest pain alleviated by lying on right side. Usually on 2.5 - 3 liters oxygen, ED staff increased to 6 liters. After 5 mg iv cardizem, HR about 110. CTA chest shows no PE, but does show worsening infiltrate in RUL and bilateral lower lobes. Denies fever, chills. Discharge from hospitalist service 10/5 to SNF. proBNP 2000 (1000) last admission. Speech  therapy assessed swallow function at bedside at SNF, and no reported problems. Patient has had pneumonia earlier this year as well   Hospital Course:  HCAP (healthcare-associated pneumonia): treated with IV abx for 4 days and then PO for 3 more days Recent urine legionella and pneumococcus ag neg.  -MBS: DYS 3 thin  COPD (chronic obstructive pulmonary disease) without exacerbation. Change xopenex to qid. Continue atrovent. Wean pred  Interstitial pulmonary fibrosis  CAD (coronary artery disease)  Hypothyroidism: TSH 5, but hesitant to increase synthroid with tachycardia. Recommend repeating in a few months when acute illness resolves  Hemoptysis: secondary to pna. Tapering off. Off warfarin and plavix- follow up with Dr. Donnie Ahoilley for resumption once hemoptysis resolved Atrial fibrillation with RVR: rate better  Acute on chronic respiratory failure: usually on 2.5 - 3 liters O2.  Physical deconditioning: PT eval pending. Back to SNF  single elevated troponin. Likely from pneumonia and a fib RVR. No evidence of ACS  Macrocytic anemia: last B12 borderline low. Replace B12   Procedures:    Consultations:    Discharge Exam: Filed Vitals:   06/13/14 0452  BP: 100/62  Pulse: 88  Temp: 97.9 F (36.6 C)  Resp: 19    General: A+Ox3, NAD- coughing up clear mucous Cardiovascular: irrr Respiratory: no wheezing  Discharge Instructions You were cared for by a hospitalist during your hospital stay. If you have any questions about your discharge medications or the care you received while you were in the hospital after you are discharged, you can call the unit and asked to speak with the hospitalist on call if the  hospitalist that took care of you is not available. Once you are discharged, your primary care physician will handle any further medical issues. Please note that NO REFILLS for any discharge medications will be authorized once you are discharged, as it is imperative that you return to  your primary care physician (or establish a relationship with a primary care physician if you do not have one) for your aftercare needs so that they can reassess your need for medications and monitor your lab values.  Discharge Instructions   DYS 3 thin liq Complete by:  As directed      Discharge instructions    Complete by:  As directed   Once pneumonia treated, resume coumadin for a fib- needs to follow up with Dr. Donnie Aho Please be sure patient gets breathing treatments EVERY 4 hours ATC O2- 2.5-3.5L Wean off steroids     Increase activity slowly    Complete by:  As directed           Current Discharge Medication List    START taking these medications   Details  diltiazem (CARDIZEM) 30 MG tablet Take 1 tablet (30 mg total) by mouth every 6 (six) hours.    HYDROcodone-acetaminophen (NORCO/VICODIN) 5-325 MG per tablet Take 1-2 tablets by mouth every 4 (four) hours as needed for moderate pain. Qty: 15 tablet, Refills: 0    ipratropium (ATROVENT) 0.02 % nebulizer solution Take 2.5 mLs (0.5 mg total) by nebulization 4 (four) times daily. Qty: 75 mL, Refills: 12    levalbuterol (XOPENEX) 0.63 MG/3ML nebulizer solution Take 3 mLs (0.63 mg total) by nebulization 4 (four) times daily. Qty: 3 mL, Refills: 12    levofloxacin (LEVAQUIN) 750 MG tablet Take 1 tablet (750 mg total) by mouth daily.      CONTINUE these medications which have CHANGED   Details  ALPRAZolam (XANAX) 0.25 MG tablet Take 1 tablet (0.25 mg total) by mouth 2 (two) times daily as needed for anxiety. Qty: 15 tablet, Refills: 0    fentaNYL (DURAGESIC - DOSED MCG/HR) 25 MCG/HR patch Place 1 patch (25 mcg total) onto the skin every 3 (three) days. Qty: 5 patch, Refills: 0    predniSONE (DELTASONE) 10 MG tablet Take 1 tablet (10 mg total) by mouth daily with breakfast.      CONTINUE these medications which have NOT CHANGED   Details  albuterol (PROVENTIL) (2.5 MG/3ML) 0.083% nebulizer solution Take 3 mLs (2.5 mg  total) by nebulization every 2 (two) hours as needed for wheezing or shortness of breath. Qty: 75 mL, Refills: 12    calcium-vitamin D (OSCAL WITH D) 500-200 MG-UNIT per tablet Take 1 tablet by mouth daily with breakfast.    carvedilol (COREG) 3.125 MG tablet Take 3.125 mg by mouth 2 (two) times daily with a meal.     digoxin (LANOXIN) 0.125 MG tablet Take 0.125 mg by mouth daily.    Fluticasone-Salmeterol (ADVAIR) 250-50 MCG/DOSE AEPB Inhale 1 puff into the lungs 2 (two) times daily.    gabapentin (NEURONTIN) 300 MG capsule Take 600 mg by mouth 2 (two) times daily.    guaiFENesin (MUCINEX) 600 MG 12 hr tablet Take 600 mg by mouth 2 (two) times daily.    isosorbide mononitrate (IMDUR) 60 MG 24 hr tablet Take 60 mg by mouth daily.      levothyroxine (SYNTHROID, LEVOTHROID) 88 MCG tablet Take 88 mcg by mouth daily before breakfast.    lubiprostone (AMITIZA) 24 MCG capsule Take 24 mcg by mouth 2 (two) times  daily with a meal.    niacin (NIASPAN) 500 MG CR tablet Take 500 mg by mouth at bedtime.      pravastatin (PRAVACHOL) 20 MG tablet Take 20 mg by mouth daily.     saccharomyces boulardii (FLORASTOR) 250 MG capsule Take 250 mg by mouth 2 (two) times daily.    vitamin B-12 1000 MCG tablet Take 1 tablet (1,000 mcg total) by mouth daily.    albuterol (PROVENTIL HFA;VENTOLIN HFA) 108 (90 BASE) MCG/ACT inhaler Inhale 2 puffs into the lungs every 6 (six) hours as needed for wheezing or shortness of breath.    feeding supplement, ENSURE COMPLETE, (ENSURE COMPLETE) LIQD Take 237 mLs by mouth 2 (two) times daily between meals.    nitroGLYCERIN (NITROSTAT) 0.4 MG SL tablet Place 0.4 mg under the tongue every 5 (five) minutes as needed for chest pain.      STOP taking these medications     ipratropium-albuterol (DUONEB) 0.5-2.5 (3) MG/3ML SOLN      warfarin (COUMADIN) 3 MG tablet        No Known Allergies    The results of significant diagnostics from this hospitalization  (including imaging, microbiology, ancillary and laboratory) are listed below for reference.    Significant Diagnostic Studies: Dg Chest 2 View  05/31/2014   CLINICAL DATA:  COPD, recent admission for community acquired pneumonia, increasing shortness of breath and hemoptysis with slow improvement of symptoms since beginning treatment, past history coronary artery disease, coronary stenting, cardioversion, pulmonary fibrosis, hypertension, former smoker, type 2 diabetes  EXAM: CHEST  2 VIEW  COMPARISON:  05/29/2014  FINDINGS: Normal heart size, mediastinal contours and pulmonary vascularity.  Emphysematous changes consistent with history of COPD.  Skin folds project over chest bilaterally.  Blunting of the RIGHT lateral costophrenic angle.  Bibasilar atelectasis.  Improved LEFT lower lobe infiltrate since previous exam.  No LEFT pleural effusion or evidence of pneumothorax.  Bones demineralized.  IMPRESSION: COPD changes with bibasilar atelectasis and improved LEFT lower lobe infiltrate.   Electronically Signed   By: Ulyses Southward M.D.   On: 05/31/2014 16:59   Dg Chest 2 View  05/29/2014   CLINICAL DATA:  80 year old male with shortness of breath.  EXAM: CHEST  2 VIEW  COMPARISON:  05/22/2014 chest CT and radiograph. Prior chest radiographs dating back to 08/01/2010.  FINDINGS: Mild cardiomegaly and COPD/emphysema again identified.  Mild airspace opacities in both lower lungs again noted.  A nodular opacity along the lateral right mid long corresponds to the nodule identified on recent CT.  No new abnormalities are identified. There is no evidence of pneumothorax or definite pleural effusion.  IMPRESSION: Unchanged bilateral lower lung airspace opacities with lateral right mid lung nodular opacity now better visualize radiographically.  Mild cardiomegaly and COPD/emphysema.   Electronically Signed   By: Laveda Abbe M.D.   On: 05/29/2014 15:50   Dg Chest 2 View  05/22/2014   CLINICAL DATA:  COPD  EXAM: CHEST  2  VIEW  COMPARISON:  04/25/2014, CT from earlier in the same day.  FINDINGS: Cardiac shadow is stable. Lungs remain hyperinflated. Fibrotic changes are again noted in the mid and lower lung fields bilaterally. No definitive acute infiltrate is seen. The nodular changes seen on the recent chest CT are less well appreciated on this exam.  IMPRESSION: COPD and chronic changes. The recently noted nodular changes on CT are not well appreciated on this exam.   Electronically Signed   By: Eulah Pont.D.  On: 05/22/2014 20:57   Ct Angio Chest W/cm &/or Wo Cm  06/08/2014   CLINICAL DATA:  Initial encounter for severe shortness of breath onset today with chest pain.  EXAM: CT ANGIOGRAPHY CHEST WITH CONTRAST  TECHNIQUE: Multidetector CT imaging of the chest was performed using the standard protocol during bolus administration of intravenous contrast. Multiplanar CT image reconstructions and MIPs were obtained to evaluate the vascular anatomy.  CONTRAST:  60mL OMNIPAQUE IOHEXOL 350 MG/ML SOLN  COMPARISON:  05/22/2014.  FINDINGS: No filling defects within the opacified pulmonary arteries to suggest the presence of an acute pulmonary embolus. Inadequate visualization of the thoracic aorta to allow assessment for dissection. There is no thoracic aortic aneurysm.  No axillary lymphadenopathy. 8 mm short axis right paratracheal lymph node is stable. No hilar or subcarinal lymphadenopathy. Heart is markedly enlarged. Coronary artery calcification is noted. No pericardial effusion.  Lung windows show markedly advanced emphysema bilaterally. There is posterior right upper lobe and bilateral lower lobe airspace disease, progressed in the interval. Interstitial markings are more prominent than on the previous study. Small bilateral pleural effusions are associated.  Bone windows reveal no worrisome lytic or sclerotic osseous lesions.  Review of the MIP images confirms the above findings.  IMPRESSION: No CT evidence for acute  pulmonary embolus.  Interval increase in interstitial opacity with airspace disease in the posterior right upper and posterior bilateral lower lobes. Imaging features may be related to dependent edema in this markedly emphysematous patient although diffuse infection cannot be excluded.   Electronically Signed   By: Kennith Center M.D.   On: 06/08/2014 14:43   Ct Angio Chest W/cm &/or Wo Cm  05/22/2014   CLINICAL DATA:  Chest pain and shortness of breath getting worse, hemoptysis  EXAM: CT ANGIOGRAPHY CHEST WITH CONTRAST  TECHNIQUE: Multidetector CT imaging of the chest was performed using the standard protocol during bolus administration of intravenous contrast. Multiplanar CT image reconstructions and MIPs were obtained to evaluate the vascular anatomy.  CONTRAST:  OMNIPAQUE IOHEXOL 350 MG/ML SOLN  COMPARISON:  CT abdomen pelvis 11/14/2012 and CT thorax 11/26/2009  FINDINGS: Severe diffuse COPD again identified. Peripheral reticular opacities in the mid to lower lung zones bilaterally, similar to prior study suggesting interstitial pulmonary fibrosis. More confluent airspace opacities posteriorly bilaterally suggesting atelectasis. In the superior segment of the right lower lobe there is more confluent reticular nodular infiltrate, with several prominent nodules measuring up to about 15 mm in the superior aspect of the superior segment. This process can 10 use into the central right lower lobe.  There are no filling defects in the pulmonary arterial system. There is calcification of the thoracic aorta. No pleural or pericardial effusion. No significant hilar or mediastinal adenopathy.  Images of the upper abdomen demonstrate numerous bilateral renal lesions measuring between 1 and 4 cm, too numerous to count, not characterized on this study, as contrast has not reached the viscera. There is infrarenal abdominal aortic aneurysm to a diameter of at least 43 x 45 mm.  There is approximately 25% compression  deformity of L2. There is an acute appearing vertical fracture line. There is no evidence of significant retropulsion. This is new from 11/14/2012.  Review of the MIP images confirms the above findings.  IMPRESSION: 1. No evidence pulmonary embolism. 2. Superimposed on severe COPD and findings chronic interstitial pulmonary fibrosis, there is reticular nodular infiltrate throughout the right lower lobe particularly in the superior segment. In the superior segment there also more confluent nodular  opacities. These findings suggest pneumonia. CT follow-up after appropriate therapy recommended to ensure resolution, particularly of the nodular densities. 3. Known infrarenal abdominal aortic aneurysm 4. Numerous renal lesions, not fully characterized by this study, but there appearance is not significantly different from 11/14/2012 CT abdomen and pelvis at which time they appeared consistent with cysts. 5. Mild but acute appearing L2 compression deformity.   Electronically Signed   By: Esperanza Heir M.D.   On: 05/22/2014 20:52   Dg Chest Portable 1 View  06/08/2014   CLINICAL DATA:  Subsequent encounter for two-month history of hemoptysis  EXAM: PORTABLE CHEST - 1 VIEW  COMPARISON:  05/31/2014.  A 02/17/2014.  FINDINGS: The 1310 hrs. Interval development of interstitial and bilateral lower lobe airspace disease. Small bilateral pleural effusions noted. Cardiopericardial silhouette is at upper limits of normal for size. Bones are diffusely demineralized. Telemetry leads overlie the chest.  IMPRESSION: The interval development of mid and lower lung interstitial disease with basilar alveolar component. Imaging features could be related to the dependent edema in emphysematous patient. Diffuse infection could also have this appearance.  Stable tiny bilateral pleural effusions.  Underlying chronic interstitial lung disease.   Electronically Signed   By: Kennith Center M.D.   On: 06/08/2014 13:30   Dg Swallowing  Func-speech Pathology  06/09/2014   Chales Abrahams, CCC-SLP     06/09/2014  1:56 PM Objective Swallowing Evaluation: Modified Barium Swallowing Study   Patient Details  Name: Zackery Brine MRN: [id number] Date of Birth: October 11, 1926  Today's Date: 06/09/2014 Time: 1205-1235 SLP Time Calculation (min): 30 min  Past Medical History:  Past Medical History  Diagnosis Date  . COPD (chronic obstructive pulmonary disease)   . Chronic respiratory failure with hypoxia   . Pulmonary fibrosis   . Coronary artery disease   . Hypertension   . Hyperlipidemia   . Carotid stenosis   . Peripheral vascular disease   . Anemia   . Peripheral neuropathy   . B12 deficiency   . Pneumonia   . Atrial fibrillation   . Hypothyroidism   . Anxiety state, unspecified   . Unspecified deficiency anemia   . Abdominal aneurysm without mention of rupture   . Aortic valve disorders   . Type II or unspecified type diabetes mellitus without mention  of complication, not stated as uncontrolled   . Unspecified hereditary and idiopathic peripheral neuropathy   . Microscopic hematuria   . Chronic kidney disease     Kidney stones   Past Surgical History:  Past Surgical History  Procedure Laterality Date  . Inguinal hernia repair  1994  . Cardioversion  09/21/2011    Procedure: CARDIOVERSION;  Surgeon: Darden Palmer., MD;   Location: [facility] OR;  Service: Cardiovascular;  Laterality: N/A;  . Cardiac stent  2010 or 2011 pt not sure  . Hernia repair  1989   HPI:  80 yo male admitted to [facility] with respiratory difficulties,found to  have worsening pna.  Pt has end stage COPD,  hypothyroid, HCAP  (healthcare-associated pneumonia) - now on vanc and cefepime.  Recent urine legionella and pneumococcus ag neg for MD note.      Assessment / Plan / Recommendation Clinical Impression  Dysphagia Diagnosis: Mild oral phase dysphagia;Mild pharyngeal  phase dysphagia;Suspected primary esophageal dysphagia  Clinical impression: Mild oropharyngeal and suspected primary   esophageal dysphagia (known h/o esophageal dysmotility) noted.   Delayed oral transiting with lingual pumping and premature  spillage of liquids into pharynx noted.  Pharyngeal swallow  characterized by marked decreased laryngeal elevation/closure  allowing trace laryngeal penetration of thin via tsp to vocal  cords - reflexive throat clear effective to clear.    Poor laryngeal elevation contributes to decreased liquid  clearance into esophagus from pyriform sinus but reflexive dry  swallows (x2-3) effective to clear.    Of note, pt became short of breath during testing and fatigue and  work of breathing during meals likely allows episodic aspiration.     Barium tablet given with pudding appeared to lodge at  mid-proximal esophagus with pt having referrant  sensation to  pharynx, liquid swallows facilitated clearance- although with  delay.   Appearance of backflow of liquids from distal to  proximal esophagus noted without pt sensation which may place him  at risk of refluxed material.  Pt further states he has sensation  of reflux during his meals at times.      Educated pt to findings using teach back, video, verbal and  written reinforcement.  SLP to follow for dysphagia treatment,  family/pt education.  Thanks for this order.      Treatment Recommendation  Therapy as outlined in treatment plan below    Diet Recommendation Dysphagia 3 (Mechanical Soft);Thin liquid   Liquid Administration via: Cup Medication Administration: Whole meds with puree (start and  follow with liquids) Supervision: Patient able to self feed Compensations: Slow rate;Small sips/bites;Follow solids with  liquid (multiple dry swallows with liquids) Postural Changes and/or Swallow Maneuvers: Seated upright 90  degrees;Upright 30-60 min after meal    Other  Recommendations Oral Care Recommendations: Oral care BID   Follow Up Recommendations  Skilled Nursing facility    Frequency and Duration min 2x/week  2 weeks     General Date of Onset:  06/09/14 HPI: 80 yo male admitted to [facility] with respiratory  difficulties,found to have worsening pna.  Pt has end stage COPD,   hypothyroid, HCAP (healthcare-associated pneumonia) - now on  vanc and cefepime. Recent urine legionella and pneumococcus ag  neg for MD note.  Type of Study: Modified Barium Swallowing Study Reason for Referral: Objectively evaluate swallowing function Previous Swallow Assessment: esophagram completed 10/2013 showed  dysmotility Diet Prior to this Study: Regular;Thin liquids Temperature Spikes Noted: No Respiratory Status: Nasal cannula (3.5 liters) Behavior/Cognition: Alert;Cooperative;Pleasant mood Oral Cavity - Dentition: Edentulous (pt reports daughter to bring  in dentures from [place] today, pt uses dentures to eat) Self-Feeding Abilities: Able to feed self Patient Positioning: Upright in chair Baseline Vocal Quality: Hoarse Volitional Cough: Weak Volitional Swallow: Unable to elicit (pt reports due to  xerostomia) Anatomy: Within functional limits Pharyngeal Secretions: Not observed secondary MBS    Reason for Referral Objectively evaluate swallowing function   Oral Phase Oral Preparation/Oral Phase Oral Phase: Impaired Oral - Nectar Oral - Nectar Cup: Lingual pumping;Weak lingual  manipulation;Piecemeal swallowing Oral - Thin Oral - Thin Teaspoon: Lingual pumping;Weak lingual manipulation Oral - Thin Cup: Lingual pumping;Weak lingual  manipulation;Piecemeal swallowing;Left pocketing in lateral  sulci;Right pocketing in lateral sulci Oral - Thin Straw: Lingual pumping;Weak lingual  manipulation;Piecemeal swallowing Oral - Solids Oral - Puree: Lingual pumping;Weak lingual manipulation Oral - Mechanical Soft: Weak lingual manipulation Oral Phase - Comment Oral Phase - Comment: delayed oral transiting with oral residuals  in sulci of liquids, lingual pumping noted across consistencies   Pharyngeal Phase Pharyngeal Phase Pharyngeal Phase: Impaired Pharyngeal - Nectar Pharyngeal -  Nectar Cup: Premature spillage to  valleculae;Pharyngeal residue - pyriform sinuses;Reduced  airway/laryngeal closure;Reduced laryngeal elevation  Pharyngeal - Thin Pharyngeal - Thin Teaspoon: Premature spillage to  valleculae;Pharyngeal residue - pyriform sinuses;Reduced  airway/laryngeal closure;Reduced laryngeal  elevation;Penetration/Aspiration during swallow Penetration/Aspiration details (thin teaspoon): Material enters  airway, CONTACTS cords then ejected out Pharyngeal - Thin Cup: Premature spillage to  valleculae;Pharyngeal residue - pyriform sinuses;Reduced  laryngeal elevation;Reduced airway/laryngeal closure Pharyngeal - Thin Straw: Premature spillage to  valleculae;Pharyngeal residue - pyriform sinuses;Reduced  laryngeal elevation;Reduced airway/laryngeal closure Pharyngeal - Solids Pharyngeal - Puree: Reduced laryngeal elevation;Reduced  airway/laryngeal closure Pharyngeal - Mechanical Soft: Within functional limits Pharyngeal - Pill: Within functional limits (with pudding) Pharyngeal Phase - Comment Pharyngeal Comment: reflexive dry swallows effective to decrease  liquid residuals from pharynx  Cervical Esophageal Phase    GO    Cervical Esophageal Phase Cervical Esophageal Phase: Impaired Cervical Esophageal Phase - Comment Cervical Esophageal Comment: barium tablet given with pudding  appeared to lodge at mid-proximal esophagus with pt having  refferant sensation to pharynx, liquid swallows facilitated  clearance- although with delay, appearance of backflow of liquids  from distal to proximal esophagus noted without pt sensation  which may place him at risk of refluxed material         Donavan Burnetamara Kimball, MS [facility] SLP [phone]    Microbiology: Recent Results (from the past 240 hour(s))  CULTURE, EXPECTORATED SPUTUM-ASSESSMENT     Status: None   Collection Time    06/04/14  8:00 AM      Result Value Ref Range Status   Specimen Description SPUTUM   Final   Special Requests NONE   Final   Sputum  evaluation     Final   Value: THIS SPECIMEN IS ACCEPTABLE. RESPIRATORY CULTURE REPORT TO FOLLOW.   Report Status 06/04/2014 FINAL   Final  CULTURE, RESPIRATORY (NON-EXPECTORATED)     Status: None   Collection Time    06/04/14  8:00 AM      Result Value Ref Range Status   Specimen Description SPUTUM   Final   Special Requests NONE   Final   Gram Stain     Final   Value: RARE WBC PRESENT, PREDOMINANTLY MONONUCLEAR     RARE SQUAMOUS EPITHELIAL CELLS PRESENT     FEW GRAM NEGATIVE RODS     RARE GRAM POSITIVE COCCI     IN PAIRS IN CLUSTERS   Culture     Final   Value: NORMAL OROPHARYNGEAL FLORA     Performed at [facility] [facility]   Report Status 06/06/2014 FINAL   Final  CULTURE, BLOOD (ROUTINE X 2)     Status: None   Collection Time    06/08/14  5:33 PM      Result Value Ref Range Status   Specimen Description BLOOD RIGHT FOREARM   Final   Special Requests BOTTLES DRAWN AEROBIC AND ANAEROBIC 5CC   Final   Culture  Setup Time     Final   Value: 06/09/2014 00:31     [facility] at [facility]   Culture     Final   Value:        BLOOD CULTURE RECEIVED NO GROWTH TO DATE CULTURE WILL BE HELD FOR 5 DAYS BEFORE ISSUING A FINAL NEGATIVE REPORT     Performed at [facility]   Report Status PENDING   Incomplete  CULTURE, BLOOD (ROUTINE X 2)     Status: None   Collection Time    06/08/14  5:40 PM      Result Value Ref Range Status   Specimen  Description BLOOD HAND RIGHT   Final   Special Requests BOTTLES DRAWN AEROBIC AND ANAEROBIC 5CC   Final   Culture  Setup Time     Final   Value: 06/09/2014 00:30     Performed at [facility]   Culture     Final   Value:        BLOOD CULTURE RECEIVED NO GROWTH TO DATE CULTURE WILL BE HELD FOR 5 DAYS BEFORE ISSUING A FINAL NEGATIVE REPORT     Performed at [facility]   Report Status PENDING   Incomplete  MRSA PCR SCREENING     Status: None   Collection Time    06/08/14  7:20 PM      Result Value Ref Range Status    MRSA by PCR NEGATIVE  NEGATIVE Final   Comment:            The GeneXpert MRSA Assay (FDA     approved for NASAL specimens     only), is one component of a     comprehensive MRSA colonization     surveillance program. It is not     intended to diagnose MRSA     infection nor to guide or     monitor treatment for     MRSA infections.     Labs: Basic Metabolic Panel:  Recent Labs Lab 06/08/14 1227 06/11/14 1738 06/12/14 0020  NA 138 135* 136*  K 4.5 4.9 4.7  CL 97 96 96  CO2 31 30 34*  GLUCOSE 181* 107* 114*  BUN 19 24* 26*  CREATININE 0.77 0.86 0.90  CALCIUM 8.5 8.5 8.7   Liver Function Tests:  Recent Labs Lab 06/08/14 1227  AST 27  ALT 32  ALKPHOS 75  BILITOT 1.0  PROT 6.8  ALBUMIN 3.4*   No results found for this basename: LIPASE, AMYLASE,  in the last 168 hours No results found for this basename: AMMONIA,  in the last 168 hours CBC:  Recent Labs Lab 06/08/14 1215 06/09/14 0623 06/11/14 1738 06/12/14 0020  WBC 11.7* 6.0 9.9 8.3  NEUTROABS  --  3.7  --   --   HGB 10.0* 9.1* 9.7* 9.3*  HCT 32.0* 28.8* 30.8* 30.6*  MCV 103.9* 106.3* 105.5* 105.5*  PLT 174 141* 146* 145*   Cardiac Enzymes:  Recent Labs Lab 06/08/14 1240 06/08/14 2021 06/09/14 0016 06/09/14 0623  TROPONINI <0.30 <0.30 0.35* <0.30   BNP: BNP (last 3 results)  Recent Labs  05/29/14 1230 06/08/14 1227  PROBNP 1155.0* 2053.0*   CBG: No results found for this basename: GLUCAP,  in the last 168 hours     Signed:  Benjamine Mola, Kallyn Demarcus  [facility] 06/13/2014, 8:13 AM

## 2014-06-13 NOTE — Telephone Encounter (Signed)
[facility] 

## 2014-06-13 NOTE — Clinical Social Work Note (Signed)
[facility].Daughter- [name] and patient agreeable to this plan- daughter requesting EMS transport as well as copy of dc instructions which I have advised her she must get through medical records and/or MD or RN. Reece LevyJanet Marly Schuld, MSW, Theresia MajorsLCSWA [phone]

## 2014-06-13 NOTE — Telephone Encounter (Signed)
Spoke with Ashby DawesGraciela and she states that she faxed a form on 06/12/14 that needs to be dated and inititaled by Dr Craige Cottasood.  She will refax form to triage fax # and will place form to be completed by Dr sood.  Will forward to [name] to let them know when form is complete.

## 2014-06-14 ENCOUNTER — Non-Acute Institutional Stay (SKILLED_NURSING_FACILITY): Payer: Medicare Other | Admitting: Internal Medicine

## 2014-06-14 DIAGNOSIS — E038 Other specified hypothyroidism: Secondary | ICD-10-CM

## 2014-06-14 DIAGNOSIS — J189 Pneumonia, unspecified organism: Secondary | ICD-10-CM

## 2014-06-14 DIAGNOSIS — J439 Emphysema, unspecified: Secondary | ICD-10-CM

## 2014-06-14 DIAGNOSIS — I2510 Atherosclerotic heart disease of native coronary artery without angina pectoris: Secondary | ICD-10-CM

## 2014-06-15 LAB — CULTURE, BLOOD (ROUTINE X 2)
CULTURE: NO GROWTH
CULTURE: NO GROWTH

## 2014-06-15 NOTE — Progress Notes (Signed)
HISTORY & PHYSICAL  DATE: 06/14/2014   FACILITY: [facility]  LEVEL OF CARE: SNF (31)  ALLERGIES:  No Known Allergies  CHIEF COMPLAINT:  Manage pneumonia, COPD and hypothyroidism  HISTORY OF PRESENT ILLNESS: 80 year old Caucasian male was hospitalized secondary to pneumonia. After hospitalization patient is admitted to this facility for short-term rehabilitation.  PNEUMONIA: The pneumonia remains stable.  The patient denies ongoing chest pain, cough, shortness of breath, fever, chills or night sweats. No complications reported from the current antibiotic being used.  COPD: the COPD remains stable.  Pt denies sob, cough, wheezing or declining exercise tolerance.  No complications from the medications presently being used.  HYPOTHYROIDISM: The hypothyroidism remains stable. No complications noted from the medications presently being used.  The patient denies fatigue or constipation.  Last TSH is 5.  PAST MEDICAL HISTORY :  Past Medical History  Diagnosis Date  . COPD (chronic obstructive pulmonary disease)   . Chronic respiratory failure with hypoxia   . Pulmonary fibrosis   . Coronary artery disease   . Hypertension   . Hyperlipidemia   . Carotid stenosis   . Peripheral vascular disease   . Anemia   . Peripheral neuropathy   . B12 deficiency   . Pneumonia   . Atrial fibrillation   . Hypothyroidism   . Anxiety state, unspecified   . Unspecified deficiency anemia   . Abdominal aneurysm without mention of rupture   . Aortic valve disorders   . Type II or unspecified type diabetes mellitus without mention of complication, not stated as uncontrolled   . Unspecified hereditary and idiopathic peripheral neuropathy   . Microscopic hematuria   . Chronic kidney disease     Kidney stones    PAST SURGICAL HISTORY: Past Surgical History  Procedure Laterality Date  . Inguinal hernia repair  1994  . Cardioversion  09/21/2011    Procedure:  CARDIOVERSION;  Surgeon: Darden Palmer., MD;  Location: [facility] OR;  Service: Cardiovascular;  Laterality: N/A;  . Cardiac stent  2010 or 2011 pt not sure  . Hernia repair  1989    SOCIAL HISTORY:  reports that he quit smoking about 4 years ago. His smoking use included Cigarettes. He has a 65 pack-year smoking history. He has never used smokeless tobacco. He reports that he does not drink alcohol or use illicit drugs.  FAMILY HISTORY:  Family History  Problem Relation Age of Onset  . Heart failure Father   . Heart failure Mother   . Diabetes Mother   . Coronary artery disease Brother   . Diabetes Brother   . Heart disease Brother   . Hypertension Brother     CURRENT MEDICATIONS: Reviewed per MAR/see medication list  REVIEW OF SYSTEMS:  See HPI otherwise 14 point ROS is negative.  PHYSICAL EXAMINATION  VS:  See VS section  GENERAL: no acute distress, normal body habitus EYES: conjunctivae normal, sclerae normal, normal eye lids MOUTH/THROAT: lips without lesions,no lesions in the mouth,tongue is without lesions,uvula elevates in midline NECK: supple, trachea midline, no neck masses, no thyroid tenderness, no thyromegaly LYMPHATICS: no LAN in the neck, no supraclavicular LAN RESPIRATORY: breathing is even & unlabored, BS CTAB CARDIAC: Heart rate is irregularly irregular, no murmur,no extra heart sounds, right lower extremity +1 edema GI:  ABDOMEN: abdomen soft, normal BS, no masses, no tenderness  LIVER/SPLEEN: no hepatomegaly, no splenomegaly MUSCULOSKELETAL: HEAD: normal to inspection  EXTREMITIES: LEFT UPPER EXTREMITY: full  range of motion, normal strength & tone RIGHT UPPER EXTREMITY:  full range of motion, normal strength & tone LEFT LOWER EXTREMITY:  Moderate range of motion, normal strength & tone RIGHT LOWER EXTREMITY: Moderate range of motion, normal strength & tone PSYCHIATRIC: the patient is alert & oriented to person, affect & behavior  appropriate  LABS/RADIOLOGY:  Labs reviewed: Basic Metabolic Panel:  Recent Labs  40/05/2709/09/15 1227 06/11/14 1738 06/12/14 0020  NA 138 135* 136*  K 4.5 4.9 4.7  CL 97 96 96  CO2 31 30 34*  GLUCOSE 181* 107* 114*  BUN 19 24* 26*  CREATININE 0.77 0.86 0.90  CALCIUM 8.5 8.5 8.7   Liver Function Tests:  Recent Labs  05/22/14 1825 05/23/14 0045 06/08/14 1227  AST 17 17 27   ALT 8 8 32  ALKPHOS 95 88 75  BILITOT 0.9 0.8 1.0  PROT 8.0 7.4 6.8  ALBUMIN 3.7 3.4* 3.4*   CBC:  Recent Labs  06/09/14 0623 06/11/14 1738 06/12/14 0020  WBC 6.0 9.9 8.3  NEUTROABS 3.7  --   --   HGB 9.1* 9.7* 9.3*  HCT 28.8* 30.8* 30.6*  MCV 106.3* 105.5* 105.5*  PLT 141* 146* 145*   Lipid Panel:  Recent Labs  12/12/13 1033  HDL 35*   Cardiac Enzymes:  Recent Labs  06/08/14 2021 06/09/14 0016 06/09/14 0623  TROPONINI <0.30 0.35* <0.30   CBG:  Recent Labs  06/02/14 2117  GLUCAP 117*    Transthoracic Echocardiography  Patient:    Samara SnideMcgalliard, Jane MR #:       [id number] Study Date: 05/28/2014 Gender:     M Age:        86 Height:     177.8 cm Weight:     65.8 kg BSA:        1.8 m^2 Pt. Status: Room:       [id number]   ATTENDING    Calvert CantorRizwan, Saima [id number]  [id number]     [id number]  ORDERING     Rizwan, Saima [id number]  Tommi EmeryREFERRING    Rizwan, Saima [id number]  SONOGRAPHER  Silvano Bilishristy Little, RCS  ADMITTING    Leeann MustKelly, Jacob  PERFORMING   [facility], Inpatient  cc:  ------------------------------------------------------------------- LV EF: 45% -   50%  ------------------------------------------------------------------- Indications:      Ventricular tachycardia 427.1&quot;,.  ------------------------------------------------------------------- History:   PMH:   Atrial fibrillation.  Coronary artery disease. Chronic obstructive pulmonary disease.  Risk factors:  Former tobacco use. Dyslipidemia.  ------------------------------------------------------------------- Study Conclusions  - Left ventricle: The cavity  size was normal. Wall thickness was   normal. Systolic function was mildly reduced. The estimated   ejection fraction was in the range of 45% to 50%. Wall motion was   normal; there were no regional wall motion abnormalities.   Features are consistent with a pseudonormal left ventricular   filling pattern, with concomitant abnormal relaxation and   increased filling pressure (grade 2 diastolic dysfunction). - Aortic valve: There was mild stenosis. There was mild   regurgitation. Valve area (VTI): 0.93 cm^2. Valve area (Vmax):   0.95 cm^2. Valve area (Vmean): 0.99 cm^2. - Mitral valve: There was mild regurgitation. Valve area by   continuity equation (using LVOT flow): 1.26 cm^2. - Left atrium: The atrium was moderately dilated. - Tricuspid valve: There was moderate regurgitation. - Pulmonary arteries: PA peak pressure: 77 mm Hg (S).  Transthoracic echocardiography.  M-mode, complete 2D, spectral Doppler, and color Doppler.  Birthdate:  Patient birthdate: [birth year].  Age:  Patient is 80 yr old.  Sex:  Gender:  male. BMI: 20.8 kg/m^2.  Blood pressure:     130/70  Patient status: Inpatient.  Study date:  Study date: 05/28/2014. Study time: 10:27 AM.  Location:  Echo laboratory.  -------------------------------------------------------------------  ------------------------------------------------------------------- Left ventricle:  The cavity size was normal. Wall thickness was normal. Systolic function was mildly reduced. The estimated ejection fraction was in the range of 45% to 50%. Wall motion was normal; there were no regional wall motion abnormalities. Features are consistent with a pseudonormal left ventricular filling pattern, with concomitant abnormal relaxation and increased filling pressure (grade 2 diastolic dysfunction).  ------------------------------------------------------------------- Aortic valve:   Moderately thickened, mildly calcified leaflets. Doppler:   There was  mild stenosis.   There was mild regurgitation.    VTI ratio of LVOT to aortic valve: 0.33. Valve area (VTI): 0.93 cm^2. Indexed valve area (VTI): 0.52 cm^2/m^2. Peak velocity ratio of LVOT to aortic valve: 0.34. Valve area (Vmax): 0.95 cm^2. Indexed valve area (Vmax): 0.53 cm^2/m^2. Mean velocity ratio of LVOT to aortic valve: 0.35. Valve area (Vmean): 0.99 cm^2. Indexed valve area (Vmean): 0.55 cm^2/m^2.    Mean gradient (S): 12 mm Hg. Peak gradient (S): 26 mm Hg.  ------------------------------------------------------------------- Aorta:  The aorta was normal, not dilated, and non-diseased.  ------------------------------------------------------------------- Mitral valve:   Structurally normal valve.   Leaflet separation was normal.  Doppler:  Transvalvular velocity was within the normal range. There was no evidence for stenosis. There was mild regurgitation.    Valve area by continuity equation (using LVOT flow): 1.26 cm^2. Indexed valve area by continuity equation (using LVOT flow): 0.7 cm^2/m^2.    Mean gradient (D): 6 mm Hg. Peak gradient (D): 10 mm Hg.  ------------------------------------------------------------------- Left atrium:  The atrium was moderately dilated.  ------------------------------------------------------------------- Right ventricle:  The cavity size was normal. Wall thickness was normal. Systolic function was normal.  ------------------------------------------------------------------- Pulmonic valve:   Poorly visualized.  Doppler:  There was no significant regurgitation.  ------------------------------------------------------------------- Tricuspid valve:   Mildly thickened leaflets.  Doppler:  There was moderate regurgitation.  ------------------------------------------------------------------- Right atrium:  The atrium was normal in size.  ------------------------------------------------------------------- Pericardium:  The pericardium was normal  in appearance.  ------------------------------------------------------------------- Systemic veins: Inferior vena cava: The vessel was dilated. The respirophasic diameter changes were blunted (< 50%), consistent with elevated central venous pressure.  ------------------------------------------------------------------- Post procedure conclusions Ascending Aorta:  - The aorta was normal, not dilated, and non-diseased.  ------------------------------------------------------------------- Measurements   Left ventricle                            Value          Reference  LV ID, ED, PLAX chordal           (L)     42.8  mm       43 - 52  LV ID, ES, PLAX chordal                   34.3  mm       23 - 38  LV fx shortening, PLAX chordal    (L)     20    %        >=29  LV PW thickness, ED                       10.5  mm       ---------  IVS/LV PW ratio, ED  0.87           <=1.3  Stroke volume, 2D                         45    ml       ---------  Stroke volume/bsa, 2D                     25    ml/m^2   ---------  LV e&', lateral                            6.8   cm/s     ---------  LV E/e&', lateral                          23.53          ---------  LV e&', medial                             6.25  cm/s     ---------  LV E/e&', medial                           25.6           ---------  LV e&', average                            6.53  cm/s     ---------  LV E/e&', average                          24.52          ---------    Ventricular septum                        Value          Reference  IVS thickness, ED                         9.09  mm       ---------    LVOT                                      Value          Reference  LVOT ID, S                                19    mm       ---------  LVOT area                                 2.84  cm^2     ---------  LVOT peak velocity, S                     86.4  cm/s     ---------  LVOT mean velocity, S  56.6   cm/s     ---------  LVOT VTI, S                               15.9  cm       ---------  LVOT peak gradient, S                     3     mm Hg    ---------    Aortic valve                              Value          Reference  Aortic valve peak velocity, S             257   cm/s     ---------  Aortic valve mean velocity, S             163   cm/s     ---------  Aortic valve VTI, S                       48.5  cm       ---------  Aortic mean gradient, S                   12    mm Hg    ---------  Aortic peak gradient, S                   26    mm Hg    ---------  VTI ratio, LVOT/AV                        0.33           ---------  Aortic valve area, VTI                    0.93  cm^2     ---------  Aortic valve area/bsa, VTI                0.52  cm^2/m^2 ---------  Velocity ratio, peak, LVOT/AV             0.34           ---------  Aortic valve area, peak velocity          0.95  cm^2     ---------  Aortic valve area/bsa, peak               0.53  cm^2/m^2 ---------  velocity  Velocity ratio, mean, LVOT/AV             0.35           ---------  Aortic valve area, mean velocity          0.99  cm^2     ---------  Aortic valve area/bsa, mean               0.55  cm^2/m^2 ---------  velocity    Aorta                                     Value          Reference  Aortic root ID, ED  27    mm       ---------    Left atrium                               Value          Reference  LA ID, A-P, ES                            47    mm       ---------  LA ID/bsa, A-P                    (H)     2.62  cm/m^2   <=2.2  LA volume, ES, 1-p A4C                    48    ml       ---------  LA volume/bsa, ES, 1-p A4C                26.7  ml/m^2   ---------  LA volume, ES, 1-p A2C                    67    ml       ---------  LA volume/bsa, ES, 1-p A2C                37.3  ml/m^2   ---------    Mitral valve                              Value          Reference  Mitral E-wave peak velocity                160   cm/s     ---------  Mitral A-wave peak velocity               90.7  cm/s     ---------  Mitral mean velocity, D                   106   cm/s     ---------  Mitral deceleration time                  173   ms       150 - 230  Mitral mean gradient, D                   6     mm Hg    ---------  Mitral peak gradient, D                   10    mm Hg    ---------  Mitral E/A ratio, peak                    1.8            ---------  Mitral valve area, LVOT                   1.26  cm^2     ---------  continuity  Mitral valve area/bsa, LVOT               0.7   cm^2/m^2 ---------  continuity  Mitral annulus VTI, D  35.8  cm       ---------    Pulmonary arteries                        Value          Reference  PA pressure, S, DP                (H)     77    mm Hg    <=30    Tricuspid valve                           Value          Reference  Tricuspid regurg peak velocity            394   cm/s     ---------  Tricuspid peak RV-RA gradient             62    mm Hg    ---------    Systemic veins                            Value          Reference  Estimated CVP                             15    mm Hg    ---------    Right ventricle                           Value          Reference  RV pressure, S, DP                (H)     77    mm Hg    <=30  Legend: (L)  and  (H)  mark values outside specified reference range.  PORTABLE CHEST - 1 VIEW   COMPARISON:  05/31/2014.  A 02/17/2014.   FINDINGS: The 1310 hrs. Interval development of interstitial and bilateral lower lobe airspace disease. Small bilateral pleural effusions noted. Cardiopericardial silhouette is at upper limits of normal for size. Bones are diffusely demineralized. Telemetry leads overlie the chest.   IMPRESSION: The interval development of mid and lower lung interstitial disease with basilar alveolar component. Imaging features could be related to the dependent edema in emphysematous patient. Diffuse  infection could also have this appearance.   Stable tiny bilateral pleural effusions.   Underlying chronic interstitial lung disease.     CT ANGIOGRAPHY CHEST WITH CONTRAST   TECHNIQUE: Multidetector CT imaging of the chest was performed using the standard protocol during bolus administration of intravenous contrast. Multiplanar CT image reconstructions and MIPs were obtained to evaluate the vascular anatomy.   CONTRAST:  60mL OMNIPAQUE IOHEXOL 350 MG/ML SOLN   COMPARISON:  05/22/2014.   FINDINGS: No filling defects within the opacified pulmonary arteries to suggest the presence of an acute pulmonary embolus. Inadequate visualization of the thoracic aorta to allow assessment for dissection. There is no thoracic aortic aneurysm.   No axillary lymphadenopathy. 8 mm short axis right paratracheal lymph node is stable. No hilar or subcarinal lymphadenopathy. Heart is markedly enlarged. Coronary artery calcification is noted. No pericardial effusion.   Lung windows show markedly advanced emphysema bilaterally. There is posterior right upper lobe  and bilateral lower lobe airspace disease, progressed in the interval. Interstitial markings are more prominent than on the previous study. Small bilateral pleural effusions are associated.   Bone windows reveal no worrisome lytic or sclerotic osseous lesions.   Review of the MIP images confirms the above findings.   IMPRESSION: No CT evidence for acute pulmonary embolus.   Interval increase in interstitial opacity with airspace disease in the posterior right upper and posterior bilateral lower lobes. Imaging features may be related to dependent edema in this markedly emphysematous patient although diffuse infection cannot be excluded.    ASSESSMENT/PLAN:  Pneumonia-continue Levaquin COPD-compensated Hypothyroidism-continue levothyroxine Synthroid was not increased due to tachycardia. Recheck TSH in 3  months. CAD-stable atrial fibrillation-rate controlled Macrocytosis-check RBC folate and vitamin B12 level Check CBC  I have reviewed patient's medical records received at admission/from hospitalization.  CPT CODE: 56213  Angela Cox, MD [facility] [phone]

## 2014-06-19 ENCOUNTER — Encounter (HOSPITAL_COMMUNITY): Payer: Medicare Other

## 2014-06-19 ENCOUNTER — Other Ambulatory Visit (HOSPITAL_COMMUNITY): Payer: Medicare Other

## 2014-06-19 NOTE — Telephone Encounter (Signed)
Form was faxed back to [facility] @ [phone] on 10/19 Called to confirm received, office closed and voicemail of [name] is fulled, unable to leave call back information.  wcb

## 2014-06-20 NOTE — Telephone Encounter (Signed)
ATC [employer]. Office is on [place] time [place]. Will need to call after 10:00 [place] time. As their office will not open till 07:00 (1000am [place]). WCB.

## 2014-06-21 NOTE — Telephone Encounter (Signed)
Called and spoke to Tyndall AFBGraciela. She stated the forms were received and nothing further is needed. Will sign off.

## 2014-06-29 ENCOUNTER — Ambulatory Visit: Payer: Medicare Other | Admitting: Vascular Surgery

## 2014-07-04 ENCOUNTER — Ambulatory Visit (INDEPENDENT_AMBULATORY_CARE_PROVIDER_SITE_OTHER)
Admission: RE | Admit: 2014-07-04 | Discharge: 2014-07-04 | Disposition: A | Discharge status: Home / Self Care | Payer: Medicare Other | Source: Ambulatory Visit | Attending: Pulmonary Disease | Admitting: Pulmonary Disease

## 2014-07-04 ENCOUNTER — Encounter: Payer: Self-pay | Admitting: Pulmonary Disease

## 2014-07-04 ENCOUNTER — Ambulatory Visit (INDEPENDENT_AMBULATORY_CARE_PROVIDER_SITE_OTHER): Payer: Medicare Other | Admitting: Pulmonary Disease

## 2014-07-04 VITALS — BP 122/70 | HR 88

## 2014-07-04 DIAGNOSIS — I6529 Occlusion and stenosis of unspecified carotid artery: Secondary | ICD-10-CM

## 2014-07-04 DIAGNOSIS — J432 Centrilobular emphysema: Secondary | ICD-10-CM

## 2014-07-04 DIAGNOSIS — J9611 Chronic respiratory failure with hypoxia: Secondary | ICD-10-CM

## 2014-07-04 DIAGNOSIS — R042 Hemoptysis: Secondary | ICD-10-CM

## 2014-07-04 DIAGNOSIS — J8410 Pulmonary fibrosis, unspecified: Secondary | ICD-10-CM

## 2014-07-04 DIAGNOSIS — J189 Pneumonia, unspecified organism: Secondary | ICD-10-CM

## 2014-07-04 DIAGNOSIS — Z515 Encounter for palliative care: Secondary | ICD-10-CM

## 2014-07-04 NOTE — Patient Instructions (Signed)
Chest xray today Will arrange for referral to palliative care Follow up in 2 months

## 2014-07-04 NOTE — Progress Notes (Signed)
Chief Complaint  Patient presents with  . Follow-up    Pt reports that he has had much improvement over the past couple days. Pt states he is was having some increased congestion with brown mucus with blood x 2-3 days ago.     CC: Viann FishSpencer Tilley   History of Present Illness: Samara SnideGrady Gawronski is a 80 y.o. male former smoker with COPD, Pulmonary fibrosis, and hypoxemia.  Since I last saw him in May, he was hospitalized twice for pneumonia and hemoptysis.  He was d/c from hospital 06/13/14.  He has been feeling much better.  He still has cough with brown sputum >> this looks different compared to when he was coughing blood.  He has been restarted on coumadin.  He denies chest pain.  His leg swelling is better.  He is using 2.5 to 3 liters oxygen 24/7.  TESTS: Spirometry 12/05/09 >> FEV1 1.20(47%), FVC 2.56(64%), FEV1% 46 Labs 02/04/11 >> ANA 1:640 Echo 05/28/14 >> EF 45 to 50%, grade 2 diastolic dysfx, mild AS, mod LA dilation, PAS 77 mmHg CT chest 06/08/14 >> severe emphysema, prominent interstitial markings  Physical Exam:  General - No distress, wearing oxygen ENT - No sinus tenderness, clear nasal discharge, no oral exudate, no LAN Cardiac - s1s2 regular, no murmur Chest - Decreased breath sounds, no wheeze/rales/dullness Back - No focal tenderness Abd - Soft, non-tender Ext - 1+ edema Neuro - Normal strength Skin - No rashes Psych - normal mood, and behavior   Assessment/Plan:  Coralyn HellingVineet Damontae Loppnow, MD [facility] Pager:  [phone] 07/04/2014, 4:39 PM

## 2014-07-05 ENCOUNTER — Telehealth: Payer: Self-pay | Admitting: Pulmonary Disease

## 2014-07-05 NOTE — Telephone Encounter (Signed)
Called made yvette aware. nothing further needed

## 2014-07-05 NOTE — Telephone Encounter (Signed)
Dg Chest 2 View  07/05/2014   CLINICAL DATA:  Productive cough. Recent pneumonia. Atrial fibrillation. Coronary artery disease. COPD.  EXAM: CHEST  2 VIEW  COMPARISON:  06/08/2014  FINDINGS: Pulmonary emphysema noted. There has been near complete resolution of bilateral pulmonary airspace disease since previous study. Persistent tiny right pleural effusion versus pleural thickening noted. Mild cardiomegaly is stable.  IMPRESSION: Near complete resolution of bilateral pulmonary airspace disease since prior study.  Emphysema.   Electronically Signed   By: Myles RosenthalJohn  Stahl M.D.   On: 07/05/2014 08:28    Will have my nurse inform pt that CXR looks better.  Pneumonia nearly resolved.  No change to current tx plan.

## 2014-07-05 NOTE — Telephone Encounter (Signed)
Please advise VS thanks 

## 2014-07-05 NOTE — Telephone Encounter (Signed)
Yes

## 2014-07-05 NOTE — Telephone Encounter (Signed)
Bronson IngYvette called from [facility] stating that they received the referral on patient. Needs to know if Dr. Craige CottaSood would be the attending physician for this patient and if he would like Hospice physicians to assist with symptom management? Please call Bronson IngYvette at [phone]. Thanks. Rhonda J Cobb

## 2014-07-06 ENCOUNTER — Other Ambulatory Visit: Payer: Self-pay | Admitting: *Deleted

## 2014-07-06 MED ORDER — ALPRAZOLAM 0.25 MG PO TABS: ORAL_TABLET | ORAL | Status: DC

## 2014-07-06 NOTE — Telephone Encounter (Signed)
lmtcb x1 

## 2014-07-06 NOTE — Telephone Encounter (Signed)
[facility] 

## 2014-07-07 DIAGNOSIS — Z515 Encounter for palliative care: Secondary | ICD-10-CM | POA: Insufficient documentation

## 2014-07-07 NOTE — Assessment & Plan Note (Signed)
Related to recurrent episodes of pneumonia.  Improved.

## 2014-07-07 NOTE — Assessment & Plan Note (Signed)
He has hx of positive ANA with pulmonary fibrosis.  This is not as prominent an issue compared to his COPD.

## 2014-07-07 NOTE — Assessment & Plan Note (Signed)
The patient and his family are interested in assessment for home hospice care.  I will place referral for this.

## 2014-07-07 NOTE — Assessment & Plan Note (Signed)
He is to continue symbicort and prn nebulizer tx.

## 2014-07-07 NOTE — Assessment & Plan Note (Signed)
Will repeat chest xray to document clearing of previous infiltrates.

## 2014-07-07 NOTE — Assessment & Plan Note (Signed)
Related to COPD and pulmonary fibrosis.

## 2014-07-09 ENCOUNTER — Telehealth: Payer: Self-pay | Admitting: Pulmonary Disease

## 2014-07-09 ENCOUNTER — Other Ambulatory Visit: Payer: Self-pay | Admitting: *Deleted

## 2014-07-09 MED ORDER — FENTANYL 25 MCG/HR TD PT72: 25.0000 ug | MEDICATED_PATCH | TRANSDERMAL | Status: DC

## 2014-07-09 NOTE — Telephone Encounter (Signed)
Results have been explained to patient, pt expressed understanding. Nothing further needed.  

## 2014-07-09 NOTE — Telephone Encounter (Signed)
Spoke with daughter [name], aware of results of cxr. [facility] since being out of hospital.  Pt is currently on abx - started on new course of abx 06/25/14 for the PNA. Daughter wanting to know if these need to be completed as his PNA is "nearly resolved". Advised her that it would be best for him to complete the length of therapy as Rxd. Meantime I will get rec's per VS.  Daughter states that Palliative care has not done their evaluation yet.    Coralyn HellingVineet Sood, MD at 07/05/2014 4:05 PM     Status: Signed       Expand All Collapse All    Imaging Results (Last 48 hours)    Dg Chest 2 View  07/05/2014 CLINICAL DATA: Productive cough. Recent pneumonia. Atrial fibrillation. Coronary artery disease. COPD. EXAM: CHEST 2 VIEW COMPARISON: 06/08/2014 FINDINGS: Pulmonary emphysema noted. There has been near complete resolution of bilateral pulmonary airspace disease since previous study. Persistent tiny right pleural effusion versus pleural thickening noted. Mild cardiomegaly is stable. IMPRESSION: Near complete resolution of bilateral pulmonary airspace disease since prior study. Emphysema. Electronically Signed By: Myles RosenthalJohn Stahl M.D. On: 07/05/2014 08:28     Will have my nurse inform pt that CXR looks better. Pneumonia nearly resolved. No change to current tx plan.       Please advise Dr Craige CottaSood. Thanks

## 2014-07-09 NOTE — Telephone Encounter (Signed)
[facility] 

## 2014-07-09 NOTE — Telephone Encounter (Signed)
Spoke with Renee, aware of rec's per VS Nothing further needed.

## 2014-07-09 NOTE — Telephone Encounter (Signed)
Please inform her that Mr. Salome ArntMcGalliard needs to complete his current course of antibiotics.

## 2014-07-09 NOTE — Telephone Encounter (Signed)
I have signed off on CXR results >> see phone note from 07/05/14.  Okay to fax copy of [facility] report to [facility].

## 2014-07-09 NOTE — Telephone Encounter (Signed)
Spoke with the Nurse at [facility]  She states that they are needing cxr results  I advised VS has not singed off on this yet and will be back in the office on 07/12/14  Please advise results, thanks!

## 2014-07-10 ENCOUNTER — Telehealth: Payer: Self-pay | Admitting: Pulmonary Disease

## 2014-07-10 NOTE — Telephone Encounter (Signed)
Results faxed to [facility] attn Diane per Dr Craige CottaSood instructions.

## 2014-07-10 NOTE — Telephone Encounter (Signed)
Called [place] and reached after hrs hospice. WCB

## 2014-07-11 NOTE — Telephone Encounter (Signed)
Noted  

## 2014-07-11 NOTE — Telephone Encounter (Signed)
Called and spoke to [place]. Bronson IngYvette wanting to give VS an FYI- pt is currently at [facility] and Hospice is unable to admit when pt is currently at facility but is following under palliative care under d/c. Pt is to be [facility]17 which will then be admitted under Hospice.   Will send to VS as FYI that pt will not be able to be admitted under Hospice till 11/17.

## 2014-07-13 ENCOUNTER — Encounter: Payer: Self-pay | Admitting: Adult Health

## 2014-07-13 ENCOUNTER — Non-Acute Institutional Stay (SKILLED_NURSING_FACILITY): Payer: Medicare Other | Admitting: Adult Health

## 2014-07-13 DIAGNOSIS — J9611 Chronic respiratory failure with hypoxia: Secondary | ICD-10-CM

## 2014-07-13 DIAGNOSIS — E785 Hyperlipidemia, unspecified: Secondary | ICD-10-CM

## 2014-07-13 DIAGNOSIS — G629 Polyneuropathy, unspecified: Secondary | ICD-10-CM

## 2014-07-13 DIAGNOSIS — J432 Centrilobular emphysema: Secondary | ICD-10-CM

## 2014-07-13 DIAGNOSIS — I2510 Atherosclerotic heart disease of native coronary artery without angina pectoris: Secondary | ICD-10-CM

## 2014-07-13 DIAGNOSIS — K5909 Other constipation: Secondary | ICD-10-CM

## 2014-07-13 DIAGNOSIS — F419 Anxiety disorder, unspecified: Secondary | ICD-10-CM

## 2014-07-13 DIAGNOSIS — E038 Other specified hypothyroidism: Secondary | ICD-10-CM

## 2014-07-13 DIAGNOSIS — I4891 Unspecified atrial fibrillation: Secondary | ICD-10-CM

## 2014-07-13 DIAGNOSIS — J8410 Pulmonary fibrosis, unspecified: Secondary | ICD-10-CM

## 2014-07-13 DIAGNOSIS — R609 Edema, unspecified: Secondary | ICD-10-CM

## 2014-07-13 NOTE — Progress Notes (Signed)
Patient ID: Travis SnideGrady Bond, male   DOB: 1927/08/25, 80 y.o.   MRN: [id number]   07/13/2014  Facility:  Nursing Home Location:  [facility] Room Number: 106-P LEVEL OF CARE:  SNF (31)   Chief Complaint  Patient presents with  . Discharge Note    COPD, CAD, Hypothyroidism, AFib with RVR, Chronic respiratory failure, Hyperlipidemia, Constipation, Peripheral neuropathy, Edema and Anxiety    HISTORY OF PRESENT ILLNESS:  This is an 80 year old male who is for discharge home with home health PT, OT, nursing, social worker, CNA and speech therapist. DME: wheelchair with cushion, anti-tippers and leg rests; bedside commode, O2 @ 3 L/min via  continuous and portable gas; and semi-electric hospital bed. [facility] with HCAP ( healthcare associated pneumonia). He was treated read IV antibiotic 4 days and then by mouth 3 more days. Patient was admitted to this facility for short-term rehabilitation after the patient's recent hospitalization.  Patient has completed SNF rehabilitation and therapy has cleared the patient for discharge.  REASSESSMENT OF ONGOING PROBLEMS:  CAD: The angina has been stable. No complications noted from the medication presently being used.  ATRIAL FIBRILLATION: the patients atrial fibrillation remains stable. No complications noted from the medications currently being used.  PERIPHERAL NEUROPATHY: The peripheral neuropathy is stable. The patient denies pain in the feet, tingling, and numbness. No complications noted from the medication presently being used.  PAST MEDICAL HISTORY:  Past Medical History  Diagnosis Date  . COPD (chronic obstructive pulmonary disease)   . Chronic respiratory failure with hypoxia   . Pulmonary fibrosis   . Coronary artery disease   . Hypertension   . Hyperlipidemia   . Carotid stenosis   . Peripheral vascular disease   . Anemia   .  Peripheral neuropathy   . B12 deficiency   . Pneumonia   . Atrial fibrillation   . Hypothyroidism   . Anxiety state, unspecified   . Unspecified deficiency anemia   . Abdominal aneurysm without mention of rupture   . Aortic valve disorders   . Type II or unspecified type diabetes mellitus without mention of complication, not stated as uncontrolled   . Unspecified hereditary and idiopathic peripheral neuropathy   . Microscopic hematuria   . Chronic kidney disease     Kidney stones    CURRENT MEDICATIONS: Reviewed per MAR/see medication list  No Known Allergies   REVIEW OF SYSTEMS:  GENERAL: no fever or chills  RESPIRATORY: no wheezing, hemoptysis CARDIAC: no chest pain, or palpitations, +edema GI: no abdominal pain, diarrhea, constipation, heart burn, nausea or vomiting  PHYSICAL EXAMINATION  GENERAL: no acute distress, normal body habitus EYES: conjunctivae normal, sclerae normal, normal eye lids NECK: supple, trachea midline, no neck masses, no thyroid tenderness, no thyromegaly LYMPHATICS: no LAN in the neck, no supraclavicular LAN RESPIRATORY: breathing is even & unlabored, BS CTAB CARDIAC: RRR, no murmur,no extra heart sounds, BLE edema 2+ GI: abdomen soft, normal BS, no masses, no tenderness, no hepatomegaly, no splenomegaly EXTREMITIES: able to move all 4 extremities PSYCHIATRIC: the patient is alert & oriented to person, affect & behavior appropriate  LABS/RADIOLOGY: 06/25/14  Sodium 139 potassium 3.8 glucose 134 BUN 16 creatinine 0.8 calcium 9.0 06/23/14  Chest x-ray shows borderline cardiomegaly without evident congestive heart failure and  bilateral pneumonia.  06/14/14  WBC 6.2 hemoglobin 8.9 hematocrit 29.6 MCV 111.3 vitamin B12 609 folate 1259 Labs reviewed: Basic Metabolic  Panel:  Recent Labs  06/08/14 1227 06/11/14 1738 06/12/14 0020  NA 138 135* 136*  K 4.5 4.9 4.7  CL 97 96 96  CO2 31 30 34*  GLUCOSE 181* 107* 114*  BUN 19 24* 26*    CREATININE 0.77 0.86 0.90  CALCIUM 8.5 8.5 8.7   Liver Function Tests:  Recent Labs  05/22/14 1825 05/23/14 0045 06/08/14 1227  AST 17 17 27   ALT 8 8 32  ALKPHOS 95 88 75  BILITOT 0.9 0.8 1.0  PROT 8.0 7.4 6.8  ALBUMIN 3.7 3.4* 3.4*   CBC:  Recent Labs  06/09/14 0623 06/11/14 1738 06/12/14 0020  WBC 6.0 9.9 8.3  NEUTROABS 3.7  --   --   HGB 9.1* 9.7* 9.3*  HCT 28.8* 30.8* 30.6*  MCV 106.3* 105.5* 105.5*  PLT 141* 146* 145*   Lipid Panel:  Recent Labs  12/12/13 1033  HDL 35*   Cardiac Enzymes:  Recent Labs  06/08/14 2021 06/09/14 0016 06/09/14 0623  TROPONINI <0.30 0.35* <0.30   CBG:  Recent Labs  06/02/14 2117  GLUCAP 117*    Dg Chest 2 View  07/05/2014   CLINICAL DATA:  Productive cough. Recent pneumonia. Atrial fibrillation. Coronary artery disease. COPD.  EXAM: CHEST  2 VIEW  COMPARISON:  06/08/2014  FINDINGS: Pulmonary emphysema noted. There has been near complete resolution of bilateral pulmonary airspace disease since previous study. Persistent tiny right pleural effusion versus pleural thickening noted. Mild cardiomegaly is stable.  IMPRESSION: Near complete resolution of bilateral pulmonary airspace disease since prior study.  Emphysema.   Electronically Signed   By: Myles RosenthalJohn  Stahl M.D.   On: 07/05/2014 08:28    ASSESSMENT/PLAN:  COPD - stable; continue Cefanex 4 times a day, prednisone 10 mg 1 tab by mouth daily, Symbicort A ER 160-4.5 g 2 puffs twice a day, Mucinex 600 mg twice a day and albuterol when necessary Interstitial for pulmonary fibrosis - on palliative care CAD - stable; continue Imdur 60 mg ER 1 tab by mouth daily Hypothyroidism - continue Synthroid 88 g daily Atrial fibrillation with RVR - rate controlled; continue Cardizem 30 mg by mouth every 6 hours, digoxin 0.125 mg by mouth daily, Coreg 3.125 mg 1 tab by mouth twice a day and Coumadin 4 mg by mouth daily  Chronic respiratory failure - on O2 at 3 L/m via [place]  continuously Hyperlipidemia - continue Lipitor 10 mg by mouth daily Constipation - stable; continue Amitiza 24 g 1 capsule by mouth twice a day, senna S2 tabs by mouth twice a day, Colace 100 mg by mouth twice a day and MiraLAX 17 g +4-8 ounces liquid by mouth daily Peripheral neuropathy - continue Duragesic 25 g/hour 1 patch transdermally every 72 hours, Norco 5/325 mg 1 tab by mouth daily at bedtime and 1-2 tabs by mouth every 4 hours when necessary Bilateral lower extremity edema - continue Lasix 20 mg by mouth daily Anxiety - stable; continue Xanax 0.25 mg 1 tab by mouth daily at bedtime and twice a day when necessary   I have filled out patient's discharge paperwork and written prescriptions.  Patient will receive home health PT, OT, ST, Nursing, Social worker and CNA.  DME provided:  wheelchair with cushion, anti-tippers and leg rests; bedside commode, O2 @ 3 L/min via [place] continuous and portable gas; and semi-electric hospital bed  Total discharge time: Greater than 30 minutes  Discharge time involved coordination of the discharge process with social worker, nursing staff and therapy  department. Medical justification for home health services/DME verified.  CPT CODE: 16109    [facility], NP [facility] [phone]

## 2014-07-19 ENCOUNTER — Telehealth: Payer: Self-pay | Admitting: Pulmonary Disease

## 2014-07-19 MED ORDER — HYDROCODONE-ACETAMINOPHEN 5-325 MG PO TABS: 1.0000 | ORAL_TABLET | ORAL | Status: DC | PRN

## 2014-07-19 MED ORDER — ALPRAZOLAM 0.25 MG PO TABS: ORAL_TABLET | ORAL | Status: DC

## 2014-07-19 NOTE — Telephone Encounter (Signed)
Hospice calling again a/b status of prescripts.Caren GriffinsStanley A Dalton

## 2014-07-19 NOTE — Telephone Encounter (Signed)
Okay to send scripts for xanax and narco.  Please send for 1 month supply with 2 refills for each.

## 2014-07-19 NOTE — Telephone Encounter (Signed)
VS hospice is calling about a refill of the pts xanax and norco.  It looks like the norco was last filled by Dr. Chilton SiGreen and the xanax was filled by Synthia Innocenteborah Green, NP.  VS please advise if ok to send in refills to the pts pharmacy.  Thankd  No Known Allergies Current Outpatient Prescriptions on File Prior to Visit  Medication Sig Dispense Refill  . albuterol (PROVENTIL HFA;VENTOLIN HFA) 108 (90 BASE) MCG/ACT inhaler Inhale 2 puffs into the lungs every 6 (six) hours as needed for wheezing or shortness of breath.    Marland Kitchen. albuterol (PROVENTIL) (2.5 MG/3ML) 0.083% nebulizer solution Take 3 mLs (2.5 mg total) by nebulization every 2 (two) hours as needed for wheezing or shortness of breath. 75 mL 12  . ALPRAZolam (XANAX) 0.25 MG tablet Take one tablet by mouth every night at bedtime at 11:30pm; Take one tablet by mouth twice daily as needed for anxiety 90 tablet 0  . atorvastatin (LIPITOR) 10 MG tablet Take 10 mg by mouth daily.    . budesonide-formoterol (SYMBICORT) 160-4.5 MCG/ACT inhaler Inhale 2 puffs into the lungs 2 (two) times daily.    . calcium-vitamin D (OSCAL WITH D) 500-200 MG-UNIT per tablet Take 1 tablet by mouth daily with breakfast.    . carvedilol (COREG) 3.125 MG tablet Take 3.125 mg by mouth 2 (two) times daily with a meal.     . digoxin (LANOXIN) 0.125 MG tablet Take 0.125 mg by mouth daily.    Marland Kitchen. diltiazem (CARDIZEM) 30 MG tablet Take 1 tablet (30 mg total) by mouth every 6 (six) hours.    . feeding supplement, ENSURE COMPLETE, (ENSURE COMPLETE) LIQD Take 237 mLs by mouth 2 (two) times daily between meals.    . fentaNYL (DURAGESIC - DOSED MCG/HR) 25 MCG/HR patch Place 1 patch (25 mcg total) onto the skin every 3 (three) days. For pain 10 patch 0  . gabapentin (NEURONTIN) 300 MG capsule Take 600 mg by mouth 2 (two) times daily.    Marland Kitchen. guaiFENesin (MUCINEX) 600 MG 12 hr tablet Take 600 mg by mouth 2 (two) times daily.    Marland Kitchen. HYDROcodone-acetaminophen (NORCO/VICODIN) 5-325 MG per tablet Take 1-2  tablets by mouth every 4 (four) hours as needed for moderate pain. 360 tablet 0  . ipratropium (ATROVENT) 0.02 % nebulizer solution Take 2.5 mLs (0.5 mg total) by nebulization 4 (four) times daily. 75 mL 12  . isosorbide mononitrate (IMDUR) 60 MG 24 hr tablet Take 60 mg by mouth daily.      Marland Kitchen. levalbuterol (XOPENEX) 0.63 MG/3ML nebulizer solution Take 3 mLs (0.63 mg total) by nebulization 4 (four) times daily. 3 mL 12  . levofloxacin (LEVAQUIN) 750 MG tablet Take 1 tablet (750 mg total) by mouth daily.    Marland Kitchen. levothyroxine (SYNTHROID, LEVOTHROID) 88 MCG tablet Take 88 mcg by mouth daily before breakfast.    . lubiprostone (AMITIZA) 24 MCG capsule Take 24 mcg by mouth 2 (two) times daily with a meal.    . niacin (NIASPAN) 500 MG CR tablet Take 500 mg by mouth at bedtime.      . nitroGLYCERIN (NITROSTAT) 0.4 MG SL tablet Place 0.4 mg under the tongue every 5 (five) minutes as needed for chest pain.    . predniSONE (DELTASONE) 10 MG tablet Take 1 tablet (10 mg total) by mouth daily with breakfast.    . saccharomyces boulardii (FLORASTOR) 250 MG capsule Take 250 mg by mouth 2 (two) times daily.    . vitamin B-12 1000  MCG tablet Take 1 tablet (1,000 mcg total) by mouth daily.    Marland Kitchen. warfarin (COUMADIN) 3 MG tablet Take 3 mg by mouth daily.     No current facility-administered medications on file prior to visit.

## 2014-07-19 NOTE — Telephone Encounter (Signed)
rx for the medications have been printed out and signed by VS and [facility] [address]  Nothing further is needed.

## 2014-07-23 ENCOUNTER — Telehealth: Payer: Self-pay | Admitting: Pulmonary Disease

## 2014-07-23 NOTE — Telephone Encounter (Signed)
He should only be using symbicort.  D/c advair from his med list.

## 2014-07-23 NOTE — Telephone Encounter (Signed)
Spoke with [name], made him aware that we will keep an eye out for the med list and forward it to VS and his nurse as we receive it.  He also has concerns about pt's medications.  Pt's pharmacy is concerned that his advair and symbicort are very similar medications and want to make sure he needs to be using both inhalers.  Dr. Craige CottaSood please advise.  Thank you.

## 2014-07-23 NOTE — Telephone Encounter (Signed)
Spoke with Travis Bond.  Advised of below per Dr. Craige CottaSood.  Travis Bond verbalized understanding and will update med list.  Travis Bond is scheduled to visit pt tomorrow and will inform pt of VS's recs. Advair is not on our pt med list.   Will forward msg to [name] -- Travis Bond reports the med list they have for pt was faxed to front fax.  States their list is the most up to date and would like us to compare meds.

## 2014-07-25 NOTE — Telephone Encounter (Signed)
Per [name]- med list has been received and will update med list. Nothing further needed. Will sign off.

## 2014-07-30 ENCOUNTER — Telehealth: Payer: Self-pay | Admitting: Pulmonary Disease

## 2014-07-30 NOTE — Telephone Encounter (Signed)
Spoke with [name], aware of verbal okay from Dr Craige CottaSood for plan in place. Nothing needed from our office other than verbal.  Mikle BosworthCarlos wanted to make Dr Craige CottaSood aware that the patient has been having nosebleeds since Thursday; constant flow. Per [name], they are going to check his INR tomrrow. Mikle BosworthCarlos wanted to leave this FYI for Dr Craige CottaSood to make him aware of current issue.

## 2014-07-30 NOTE — Telephone Encounter (Signed)
Please inform his hospice team that I am okay with this plan.

## 2014-07-30 NOTE — Telephone Encounter (Signed)
I spoke with Travis Bond, hospice nurse, and she states that she spoke with Dr. Gibson RampFeldman and he is recommending stopping the pt fentanyl and watch to see how much norco he is using. After a few days they then would like to start the pt on methadone. They wanted to know if this is ok to try. Please advise. Carron CurieJennifer Castillo, CMA

## 2014-07-30 NOTE — Telephone Encounter (Signed)
Noted  

## 2014-08-01 ENCOUNTER — Ambulatory Visit: Payer: Medicare Other | Admitting: Internal Medicine

## 2014-08-06 ENCOUNTER — Other Ambulatory Visit: Payer: Self-pay | Admitting: Adult Health

## 2014-08-13 ENCOUNTER — Telehealth: Payer: Self-pay | Admitting: Pulmonary Disease

## 2014-08-13 MED ORDER — IPRATROPIUM BROMIDE 0.02 % IN SOLN: 0.5000 mg | Freq: Four times a day (QID) | RESPIRATORY_TRACT | Status: DC

## 2014-08-13 NOTE — Telephone Encounter (Signed)
Pt is requesting that Ipratropium(Atrovent ) neb be sent to pharmacy. Last seen 07/04/14 [facility]  LM x 1 with Roselyn Reefarlos W. @ Hospice to make aware.

## 2014-08-14 NOTE — Telephone Encounter (Signed)
Spoke with Travis Boswortharlos.  Advised [facility]rop[facility]  Travis BosworthCarlos verbalized understanding, will inform pt, and voiced no further questions or concerns at this time.

## 2014-08-18 ENCOUNTER — Other Ambulatory Visit: Payer: Self-pay | Admitting: Adult Health

## 2014-08-21 ENCOUNTER — Ambulatory Visit: Payer: Medicare Other | Admitting: Internal Medicine

## 2014-08-28 ENCOUNTER — Other Ambulatory Visit: Payer: Self-pay | Admitting: *Deleted

## 2014-08-28 MED ORDER — NITROGLYCERIN 0.4 MG SL SUBL: 0.4000 mg | SUBLINGUAL_TABLET | SUBLINGUAL | Status: AC | PRN

## 2014-08-28 NOTE — Telephone Encounter (Signed)
[facility] [address] 

## 2014-09-12 ENCOUNTER — Telehealth: Payer: Self-pay | Admitting: Pulmonary Disease

## 2014-09-12 NOTE — Telephone Encounter (Signed)
As long as the pt is not in respiratory distress, and has adequate sats, wouldn't worry about pulmonary status If he is manageable from a confusion standpoint, and is not going to fall or do something crazy, would just watch him for now and call us if things get worse.   Ok to fill fentanyl patch if Dr. Craige CottaSood gives him this on a regular basis.  If not, will have to be cleared with Dr. Craige CottaSood.

## 2014-09-12 NOTE — Telephone Encounter (Signed)
Called and spoke with Travis Bond and he is aware of [facility] recs.  Will forward to VS to give the ok to refill the fentanyl.  Travis Bond is aware that someone will have to pick this up for the pt.  We will need to call the pts daughter once this is ready.  Please advise. thanks

## 2014-09-12 NOTE — Telephone Encounter (Signed)
Okay to refill fentanyl patch

## 2014-09-12 NOTE — Telephone Encounter (Addendum)
Called and spoke with Travis Bond from hospice about the pt.  He stated that the pt is doing a breathing tx now.  He is having some wheezing--sats at 98% and while ambulating at 94%.  Pt seems confused today and this is new for the pt.  Pt has not noticed the confusion and he forgot to take his meds today.  Travis Bond stated that the pt is joking around about the confusion.  Pt does have noral gait and balance.    Pt is also out of the fentynl patches and will need these ref[facility] [address]   KC  please advise since VS is not in the office. thanks  No Known Allergies  Current Outpatient Prescriptions on File Prior to Visit  Medication Sig Dispense Refill  . albuterol (PROVENTIL HFA;VENTOLIN HFA) 108 (90 BASE) MCG/ACT inhaler Inhale 2 puffs into the lungs every 6 (six) hours as needed for wheezing or shortness of breath.    Marland Kitchen albuterol (PROVENTIL) (2.5 MG/3ML) 0.083% nebulizer solution Take 3 mLs (2.5 mg total) by nebulization every 2 (two) hours as needed for wheezing or shortness of breath. 75 mL 12  . ALPRAZolam (XANAX) 0.25 MG tablet Take one tablet by mouth every night at bedtime at 11:30pm; Take one tablet by mouth twice daily as needed for anxiety 90 tablet 2  . AMITIZA 24 MCG capsule TAKE ONE CAPSULE BY MOUTH TWICE A DAY 60 capsule 2  . atorvastatin (LIPITOR) 10 MG tablet TAKE 1 TABLET BY MOUTH EVERY DAY 30 tablet 2  . budesonide-formoterol (SYMBICORT) 160-4.5 MCG/ACT inhaler Inhale 2 puffs into the lungs 2 (two) times daily.    . calcium-vitamin D (OSCAL WITH D) 500-200 MG-UNIT per tablet Take 1 tablet by mouth daily with breakfast.    . carvedilol (COREG) 3.125 MG tablet Take 3.125 mg by mouth 2 (two) times daily with a meal.     . digoxin (LANOXIN) 0.125 MG tablet Take 0.125 mg by mouth daily.    Marland Kitchen diltiazem (CARDIZEM) 30 MG tablet TAKE 1 TABLET BY MOUTH EVERY 6 HOURS 120 tablet 3  . feeding supplement, ENSURE COMPLETE, (ENSURE COMPLETE) LIQD Take 237 mLs by mouth 2 (two) times daily  between meals.    . fentaNYL (DURAGESIC - DOSED MCG/HR) 25 MCG/HR patch Place 1 patch (25 mcg total) onto the skin every 3 (three) days. For pain 10 patch 0  . gabapentin (NEURONTIN) 300 MG capsule Take 600 mg by mouth 2 (two) times daily.    Marland Kitchen guaiFENesin (MUCINEX) 600 MG 12 hr tablet Take 600 mg by mouth 2 (two) times daily.    Marland Kitchen HYDROcodone-acetaminophen (NORCO/VICODIN) 5-325 MG per tablet Take 1-2 tablets by mouth every 4 (four) hours as needed for moderate pain. 360 tablet 0  . ipratropium (ATROVENT) 0.02 % nebulizer solution Take 2.5 mLs (0.5 mg total) by nebulization 4 (four) times daily. 300 mL 6  . isosorbide mononitrate (IMDUR) 60 MG 24 hr tablet Take 60 mg by mouth daily.      Marland Kitchen levalbuterol (XOPENEX) 0.63 MG/3ML nebulizer solution Take 3 mLs (0.63 mg total) by nebulization 4 (four) times daily. 3 mL 12  . levofloxacin (LEVAQUIN) 750 MG tablet Take 1 tablet (750 mg total) by mouth daily.    Marland Kitchen levothyroxine (SYNTHROID, LEVOTHROID) 88 MCG tablet Take 88 mcg by mouth daily before breakfast.    . niacin (NIASPAN) 500 MG CR tablet Take 500 mg by mouth at bedtime.      . nitroGLYCERIN (NITROSTAT) 0.4 MG  SL tablet Place 1 tablet (0.4 mg total) under the tongue every 5 (five) minutes as needed for chest pain. 25 tablet 3  . predniSONE (DELTASONE) 10 MG tablet TAKE 1 TABLET BY MOUTH EVERY DAY 30 tablet 2  . saccharomyces boulardii (FLORASTOR) 250 MG capsule Take 250 mg by mouth 2 (two) times daily.    . vitamin B-12 1000 MCG tablet Take 1 tablet (1,000 mcg total) by mouth daily.    Marland Kitchen. warfarin (COUMADIN) 3 MG tablet Take 3 mg by mouth daily.     No current facility-administered medications on file prior to visit.

## 2014-09-13 MED ORDER — FENTANYL 25 MCG/HR TD PT72: 25.0000 ug | MEDICATED_PATCH | TRANSDERMAL | Status: DC

## 2014-09-13 NOTE — Telephone Encounter (Signed)
Called and spoke to pt's daughter, [name]. Informed Renee of the refill. Renee aware that VS is not in the office until 09/14/2014 and [facility] [address]   Rx given to [name] to follow.

## 2014-09-14 ENCOUNTER — Telehealth: Payer: Self-pay | Admitting: Pulmonary Disease

## 2014-09-14 NOTE — Telephone Encounter (Signed)
Hospice called requesting RX. This has been signed and faxed back to [phone]. [facility]- made aware that [facility] [address]  Nothing further needed.

## 2014-09-17 ENCOUNTER — Ambulatory Visit (INDEPENDENT_AMBULATORY_CARE_PROVIDER_SITE_OTHER): Payer: Medicare Other | Admitting: Pulmonary Disease

## 2014-09-17 ENCOUNTER — Encounter: Payer: Self-pay | Admitting: Pulmonary Disease

## 2014-09-17 VITALS — BP 128/70 | HR 98 | Ht 71.0 in | Wt 160.0 lb

## 2014-09-17 DIAGNOSIS — J9611 Chronic respiratory failure with hypoxia: Secondary | ICD-10-CM

## 2014-09-17 DIAGNOSIS — J432 Centrilobular emphysema: Secondary | ICD-10-CM | POA: Diagnosis not present

## 2014-09-17 DIAGNOSIS — J8410 Pulmonary fibrosis, unspecified: Secondary | ICD-10-CM

## 2014-09-17 DIAGNOSIS — Z515 Encounter for palliative care: Secondary | ICD-10-CM

## 2014-09-17 NOTE — Progress Notes (Signed)
Chief Complaint  Patient presents with  . Follow-up    Pt is requesting pulsed O2, states that his continuous is not lasting long enough on the current tanks he is using. No complaints today with breathing today.      History of Present Illness: Travis Bond is a 81 y.o. male former smoker with COPD, Pulmonary fibrosis, and hypoxemia.  He is enrolled in home hospice.  He has been feeling better compared to last fall.  He is not having much cough, wheeze, or sputum.  His leg swelling is better.  He denies chest pain, fever, or hemoptysis.  He is using 2.5 L oxygen 24/7.  He would like to change to pulsed oxygen so he can get small tanks, and allow his oxygen to last longer.  He is not very active.   TESTS: Spirometry 12/05/09 >> FEV1 1.20(47%), FVC 2.56(64%), FEV1% 46 Labs 02/04/11 >> ANA 1:640 Echo 05/28/14 >> EF 45 to 50%, grade 2 diastolic dysfx, mild AS, mod LA dilation, PAS 77 mmHg CT chest 06/08/14 >> severe emphysema, prominent interstitial markings  Physical Exam: Blood pressure 128/70, pulse 98, height 5\' 11"  (1.803 m), weight 160 lb (72.576 kg), SpO2 98 %. Body mass index is 22.33 kg/(m^2).  General - No distress, wearing oxygen, sitting in wheelchair ENT - No sinus tenderness, no oral exudate, no LAN Cardiac - s1s2 regular, 2/6 SM Chest - Decreased breath sounds, no wheeze Back - No focal tenderness Abd - Soft, non-tender Ext - 1+ edema Neuro - Normal strength Skin - No rashes Psych - normal mood, and behavior   Assessment/Plan:  COPD with emphysema. This is major issue with his breathing. Plan: - continue symbicort, prn albuterol/atrovent - continue scheduled prednisone  Hx of pulmonary fibrosis with positive ANA. This is mild. Plan: - monitor clinically  Chronic hypoxic respiratory failure. Plan: - will try to arrange for pulsed oxygen - he is to continue on 2.5 liters oxygen 24/7  Home hospice care. Plan: - he is enrolled in home  hospice   Coralyn HellingVineet Saretta Dahlem, MD [facility] Pager:  [phone] 09/17/2014, 2:11 PM

## 2014-09-17 NOTE — Patient Instructions (Signed)
Will arrange for pulsed oxygen set up Follow up in 4 months

## 2014-09-17 NOTE — Telephone Encounter (Signed)
Message not needed. °

## 2014-09-20 ENCOUNTER — Telehealth: Payer: Self-pay | Admitting: Pulmonary Disease

## 2014-09-20 MED ORDER — AMOXICILLIN-POT CLAVULANATE 875-125 MG PO TABS
1.0000 | ORAL_TABLET | Freq: Two times a day (BID) | ORAL | Status: DC
Start: 2014-09-20 — End: 2014-10-16

## 2014-09-20 NOTE — Telephone Encounter (Signed)
Spoke with Travis Bond with Hospice Pt assessed today - rales in right lung base, increased SOB, occasional cough upon awakening with clear-light yellow mucus production.  Denies respiratory distress and fever.  Would like rec's from Dr Craige Cotta. Aware that Dr Craige Cotta is off this afternoon but that we would send a message to the MD of day and have this addressed.  Please advise Dr Maple Hudson in Dr Evlyn Courier absence. Thanks.   Allergies  Allergen Reactions  . Tramadol Nausea Only     Medication List       This list is accurate as of: 09/20/14  3:05 PM.  Always use your most recent med list.               albuterol 108 (90 BASE) MCG/ACT inhaler  Commonly known as:  PROVENTIL HFA;VENTOLIN HFA  Inhale 2 puffs into the lungs every 6 (six) hours as needed for wheezing or shortness of breath.     albuterol (2.5 MG/3ML) 0.083% nebulizer solution  Commonly known as:  PROVENTIL  Take 3 mLs (2.5 mg total) by nebulization every 2 (two) hours as needed for wheezing or shortness of breath.     ALPRAZolam 0.25 MG tablet  Commonly known as:  XANAX  Take one tablet by mouth every night at bedtime at 11:30pm; Take one tablet by mouth twice daily as needed for anxiety     AMITIZA 24 MCG capsule  Generic drug:  lubiprostone  TAKE ONE CAPSULE BY MOUTH TWICE A DAY     atorvastatin 10 MG tablet  Commonly known as:  LIPITOR  TAKE 1 TABLET BY MOUTH EVERY DAY     budesonide-formoterol 160-4.5 MCG/ACT inhaler  Commonly known as:  SYMBICORT  Inhale 2 puffs into the lungs 2 (two) times daily.     calcium-vitamin D 500-200 MG-UNIT per tablet  Commonly known as:  OSCAL WITH D  Take 1 tablet by mouth daily with breakfast.     carvedilol 3.125 MG tablet  Commonly known as:  COREG  Take 3.125 mg by mouth 2 (two) times daily with a meal.     cyanocobalamin 1000 MCG tablet  Take 1 tablet (1,000 mcg total) by mouth daily.     digoxin 0.125 MG tablet  Commonly known as:  LANOXIN  Take 0.125 mg by mouth daily.     diltiazem 30 MG tablet  Commonly known as:  CARDIZEM  TAKE 1 TABLET BY MOUTH EVERY 6 HOURS     feeding supplement (ENSURE COMPLETE) Liqd  Take 237 mLs by mouth 2 (two) times daily between meals.     fentaNYL 25 MCG/HR patch  Commonly known as:  DURAGESIC - dosed mcg/hr  Place 1 patch (25 mcg total) onto the skin every 3 (three) days. For pain     gabapentin 300 MG capsule  Commonly known as:  NEURONTIN  Take 600 mg by mouth 2 (two) times daily.     guaiFENesin 600 MG 12 hr tablet  Commonly known as:  MUCINEX  Take 600 mg by mouth 2 (two) times daily.     HYDROcodone-acetaminophen 5-325 MG per tablet  Commonly known as:  NORCO/VICODIN  Take 1-2 tablets by mouth every 4 (four) hours as needed for moderate pain.     ipratropium 0.02 % nebulizer solution  Commonly known as:  ATROVENT  Take 2.5 mLs (0.5 mg total) by nebulization 4 (four) times daily.     isosorbide mononitrate 60 MG 24 hr tablet  Commonly known as:  IMDUR  Take  60 mg by mouth daily.     levalbuterol 0.63 MG/3ML nebulizer solution  Commonly known as:  XOPENEX  Take 3 mLs (0.63 mg total) by nebulization 4 (four) times daily.     levofloxacin 750 MG tablet  Commonly known as:  LEVAQUIN  Take 1 tablet (750 mg total) by mouth daily.     levothyroxine 88 MCG tablet  Commonly known as:  SYNTHROID, LEVOTHROID  Take 88 mcg by mouth daily before breakfast.     niacin 500 MG CR tablet  Commonly known as:  NIASPAN  Take 500 mg by mouth at bedtime.     nitroGLYCERIN 0.4 MG SL tablet  Commonly known as:  NITROSTAT  Place 1 tablet (0.4 mg total) under the tongue every 5 (five) minutes as needed for chest pain.     predniSONE 10 MG tablet  Commonly known as:  DELTASONE  TAKE 1 TABLET BY MOUTH EVERY DAY     saccharomyces boulardii 250 MG capsule  Commonly known as:  FLORASTOR  Take 250 mg by mouth 2 (two) times daily.     warfarin 3 MG tablet  Commonly known as:  COUMADIN  Take 3 mg by mouth daily.

## 2014-09-20 NOTE — Telephone Encounter (Signed)
Offer augmentin 875 # 14  1 twice daily for possible early pneumonia

## 2014-09-20 NOTE — Telephone Encounter (Signed)
Spoke with Travis Bond. RX has been sent in. Nothing further needed

## 2014-09-28 ENCOUNTER — Telehealth: Payer: Self-pay | Admitting: Pulmonary Disease

## 2014-09-28 NOTE — Telephone Encounter (Signed)
Noted  

## 2014-09-29 ENCOUNTER — Other Ambulatory Visit: Payer: Self-pay | Admitting: Adult Health

## 2014-10-01 NOTE — Telephone Encounter (Signed)
Spoke with patient's daughter [name], patient is taking Lasix (looked at pill bottle)

## 2014-10-02 ENCOUNTER — Telehealth: Payer: Self-pay | Admitting: Pulmonary Disease

## 2014-10-02 NOTE — Telephone Encounter (Signed)
Spoke with [name] w/Hospice States that the patient is having increased weakness/fatigue, left lung congestion with wheezing, yellow-brown mucus production, fluctuation in sleep pattern and increased dyspnea. Reports that the patient is stable today just very tired. Mikle BosworthCarlos states that she feels this is the patient's COPD worsening. Aware that Dr Craige CottaSood not in office today, states that this can wait until Dr Craige CottaSood back in office tomorrow morning. Offered to send to another physician, refused stating it can wait for Dr Craige CottaSood 10/03/14.   Please advise Dr Craige CottaSood. Thanks.   Allergies  Allergen Reactions  . Tramadol Nausea Only

## 2014-10-03 ENCOUNTER — Telehealth: Payer: Self-pay | Admitting: Pulmonary Disease

## 2014-10-03 NOTE — Telephone Encounter (Signed)
Spoke with Travis Bond again, made aware that this message will be sent to Travis Bond and we will call her back tomorrow. Offered to have another physician look at message, refused and stated that he patient is stable and she will wait for Travis Bond.   Please advise. Thanks.

## 2014-10-03 NOTE — Telephone Encounter (Signed)
Travis Bond aware that Dr Craige CottaSood has message. Will send again to advise on. Please seen open message from 10/02/14 This one will be closed since duplicate  Call Documentation      Travis Bond, CMA at 10/02/2014 4:38 PM     Status: Signed       Expand All Collapse All   Spoke with [name] w/Hospice States that the patient is having increased weakness/fatigue, left lung congestion with wheezing, yellow-brown mucus production, fluctuation in sleep pattern and increased dyspnea. Reports that the patient is stable today just very tired. Mikle BosworthCarlos states that she feels this is the patient's COPD worsening. Aware that Dr Craige CottaSood not in office today, states that this can wait until Dr Craige CottaSood back in office tomorrow morning. Offered to send to another physician, refused stating it can wait for Dr Craige CottaSood 10/03/14.   Please advise Dr Craige CottaSood. Thanks.  Allergies  Allergen Reactions  . Tramadol

## 2014-10-04 NOTE — Telephone Encounter (Signed)
Called and spoke to Travis Bond. Travis Bond stated they are going to let the pt's hospice physician know of pt's status for recs and to let VS know. Travis Bond is requesting to call him back on 10/05/14 for an update of pt's condition after getting recs from Hospice doc. Travis Bond also requesting recs from VS if any. Travis Bond aware that VS is working in hospital and is unavailable to be reached this week.   Will forward to VS as FYI and recs if any.

## 2014-10-04 NOTE — Telephone Encounter (Signed)
I spoke with [name].  Advised to have prednisone increased to 20 mg daily for 5 days.  Was also advised that pt is not candidate for pulse oxygen after assessment by DME - I am not surprised by this.

## 2014-10-08 ENCOUNTER — Telehealth: Payer: Self-pay | Admitting: Pulmonary Disease

## 2014-10-08 MED ORDER — HYDROCODONE-ACETAMINOPHEN 5-325 MG PO TABS: 1.0000 | ORAL_TABLET | ORAL | Status: DC | PRN

## 2014-10-08 NOTE — Telephone Encounter (Signed)
Rx to be given to VS on Wednesday to sign.

## 2014-10-08 NOTE — Telephone Encounter (Signed)
Okay to send refill for norco.

## 2014-10-08 NOTE — Telephone Encounter (Signed)
Travis Bond callign with update on patient. States that Prednisone was increasd on 10/05/14 and seems to be doing better.   Requests refill of Norco 5/325 - take 1-2 tablets every 4 hours PRN pain(hospice patient)   [facility] [address]  Please advise Dr Craige CottaSood. Thanks.

## 2014-10-08 NOTE — Telephone Encounter (Signed)
Per [name] the pt has enough Norco to last until 10/10/14 when VS is back in office to sign rx. Rx printed and given to [name] to have VS sign and fax.   Will forward to [name] to follow.

## 2014-10-11 ENCOUNTER — Other Ambulatory Visit: Payer: Self-pay | Admitting: Adult Health

## 2014-10-11 NOTE — Telephone Encounter (Signed)
VS signed rx, this has been [facility]ed [facility] [address] [phone] [facility]. Nothing further needed.

## 2014-10-12 ENCOUNTER — Telehealth: Payer: Self-pay | Admitting: Pulmonary Disease

## 2014-10-12 MED ORDER — FENTANYL 25 MCG/HR TD PT72: 25.0000 ug | MEDICATED_PATCH | TRANSDERMAL | Status: DC

## 2014-10-12 MED ORDER — ALPRAZOLAM 0.25 MG PO TABS: ORAL_TABLET | ORAL | Status: DC

## 2014-10-12 NOTE — Telephone Encounter (Signed)
Ok

## 2014-10-12 NOTE — Telephone Encounter (Signed)
Spoke with [name](hospice nurse), states pt needs xanax fentany [facility]ill [facility] [address] write "hospice pt" on rx.    Since VS is off this afternoon, sending to doc of afternoon.    MW please advise if you are ok with refilling this in VS' absence.  Thanks!

## 2014-10-12 NOTE — Telephone Encounter (Signed)
[name] (hospice nurse) is aware that MW has okayed for Rx's and they will be faxed to [facility] [address] Nothing more needed at this time.

## 2014-10-13 ENCOUNTER — Other Ambulatory Visit: Payer: Self-pay | Admitting: Adult Health

## 2014-10-15 ENCOUNTER — Other Ambulatory Visit: Payer: Self-pay | Admitting: Adult Health

## 2014-10-15 ENCOUNTER — Telehealth: Payer: Self-pay | Admitting: Pulmonary Disease

## 2014-10-15 MED ORDER — ALBUTEROL SULFATE (2.5 MG/3ML) 0.083% IN NEBU: 2.5000 mg | INHALATION_SOLUTION | RESPIRATORY_TRACT | Status: DC | PRN

## 2014-10-15 MED ORDER — BUDESONIDE-FORMOTEROL FUMARATE 160-4.5 MCG/ACT IN AERO: 2.0000 | INHALATION_SPRAY | Freq: Two times a day (BID) | RESPIRATORY_TRACT | Status: AC

## 2014-10-15 MED ORDER — ALBUTEROL SULFATE HFA 108 (90 BASE) MCG/ACT IN AERS: 2.0000 | INHALATION_SPRAY | Freq: Four times a day (QID) | RESPIRATORY_TRACT | Status: AC | PRN

## 2014-10-15 NOTE — Telephone Encounter (Signed)
Calling back about some more meds needed Norco pain meds ventolin and symbicort inhaler needs all of these [facility] [address]

## 2014-10-15 NOTE — Telephone Encounter (Signed)
Spoke with Travis Bond w/hospice.  Requesting refill of Albuterol soln for nebulizer, Ventolin inhaler and Symbicort. Travis Bond states that [facility] [address] never received Norco rx which was faxed last week on 2/11 [facility] [address] they have not received Rx and cannot take verbal. Verified the fax # again and re-faxed to [phone].  [facility] - verified that [facility] received.  [name] aware. Nothing further needed.

## 2014-10-16 ENCOUNTER — Ambulatory Visit (INDEPENDENT_AMBULATORY_CARE_PROVIDER_SITE_OTHER): Payer: Medicare Other | Admitting: Internal Medicine

## 2014-10-16 ENCOUNTER — Encounter: Payer: Self-pay | Admitting: Internal Medicine

## 2014-10-16 VITALS — BP 130/72 | HR 80 | Temp 97.9°F | Resp 12 | Ht 71.0 in | Wt 160.0 lb

## 2014-10-16 DIAGNOSIS — J9611 Chronic respiratory failure with hypoxia: Secondary | ICD-10-CM | POA: Diagnosis not present

## 2014-10-16 DIAGNOSIS — K5901 Slow transit constipation: Secondary | ICD-10-CM | POA: Diagnosis not present

## 2014-10-16 DIAGNOSIS — E038 Other specified hypothyroidism: Secondary | ICD-10-CM

## 2014-10-16 DIAGNOSIS — I10 Essential (primary) hypertension: Secondary | ICD-10-CM | POA: Diagnosis not present

## 2014-10-16 DIAGNOSIS — I482 Chronic atrial fibrillation, unspecified: Secondary | ICD-10-CM

## 2014-10-16 DIAGNOSIS — M4716 Other spondylosis with myelopathy, lumbar region: Secondary | ICD-10-CM

## 2014-10-16 DIAGNOSIS — J432 Centrilobular emphysema: Secondary | ICD-10-CM

## 2014-10-16 NOTE — Progress Notes (Signed)
Patient ID: Travis Bond, male   DOB: September 25, 1926, 81 y.o.   MRN: [id number]    Chief Complaint  Patient presents with  . Medical Management of Chronic Issues    3 month follow-up, no recent labs. [facility]. Last meal at 11 am if any labs due   . Immunizations    Discuss need for prevnar   . Medication Management    Left message for [name] (in charge of medications). Patient not aware of medications at the time of visit. Patient was given an updated copy from Hospice (left at home on accident)   Allergies  Allergen Reactions  . Tramadol Nausea Only   HPI 81 y/o male patient is here for routine visit. He is here with his son in law. He has end stage copd, is on continuous oxygen.  CMA called his daughter and verified his medication list. He is currently under hospice care and is seen by hospice physician and hospice nurse. He has occassional cough, dyspnea with mild exertion. Wt Readings from Last 3 Encounters:  10/16/14 160 lb (72.576 kg)  09/17/14 160 lb (72.576 kg)  07/13/14 160 lb 3.2 oz (72.666 kg)   Review of Systems  Constitutional: Negative for fever, chills, diaphoresis.  HENT: Negative for congestion, hearing loss and sore throat.   Cardiovascular: Negative for chest pain, palpitations, orthopnea and leg swelling. Heart rate is controlled, on coumadin for anticoagulation Gastrointestinal: Negative for heartburn, nausea, vomiting, abdominal pain. amitiza helps with his bowel movement Skin: Negative for itching and rash.  Neurological: Negative for dizziness, tingling, focal weakness and headaches.   Past Medical History  Diagnosis Date  . COPD (chronic obstructive pulmonary disease)   . Chronic respiratory failure with hypoxia   . Pulmonary fibrosis   . Coronary artery disease   . Hypertension   . Hyperlipidemia   . Carotid stenosis   . Peripheral vascular disease   . Anemia   . Peripheral neuropathy   . B12 deficiency   . Pneumonia     . Atrial fibrillation   . Hypothyroidism   . Anxiety state, unspecified   . Unspecified deficiency anemia   . Abdominal aneurysm without mention of rupture   . Aortic valve disorders   . Type II or unspecified type diabetes mellitus without mention of complication, not stated as uncontrolled   . Unspecified hereditary and idiopathic peripheral neuropathy   . Microscopic hematuria   . Chronic kidney disease     Kidney stones   Current Outpatient Prescriptions on File Prior to Visit  Medication Sig Dispense Refill  . albuterol (PROVENTIL HFA;VENTOLIN HFA) 108 (90 BASE) MCG/ACT inhaler Inhale 2 puffs into the lungs every 6 (six) hours as needed for wheezing or shortness of breath. 1 Inhaler 3  . albuterol (PROVENTIL) (2.5 MG/3ML) 0.083% nebulizer solution Take 3 mLs (2.5 mg total) by nebulization every 2 (two) hours as needed for wheezing or shortness of breath. 75 mL 12  . ALPRAZolam (XANAX) 0.25 MG tablet Take one tablet by mouth every night at bedtime at 11:30pm; Take one tablet by mouth twice daily as needed for anxiety  HOSPICE PT 90 tablet 0  . AMITIZA 24 MCG capsule TAKE ONE CAPSULE BY MOUTH TWICE A DAY 60 capsule 2  . atorvastatin (LIPITOR) 10 MG tablet TAKE 1 TABLET BY MOUTH EVERY DAY 30 tablet 2  . budesonide-formoterol (SYMBICORT) 160-4.5 MCG/ACT inhaler Inhale 2 puffs into the lungs 2 (two) times daily. 1 Inhaler 6  . carvedilol (  COREG) 3.125 MG tablet Take 3.125 mg by mouth 2 (two) times daily with a meal.     . diltiazem (CARDIZEM) 30 MG tablet TAKE 1 TABLET BY MOUTH EVERY 6 HOURS 120 tablet 3  . fentaNYL (DURAGESIC - DOSED MCG/HR) 25 MCG/HR patch Place 1 patch (25 mcg total) onto the skin every 3 (three) days. For pain   HOSPICE PT 10 patch 0  . furosemide (LASIX) 20 MG tablet TAKE 1 TABLET BY MOUTH EVERY DAY 30 tablet 5  . gabapentin (NEURONTIN) 300 MG capsule Take 300 mg by mouth 2 (two) times daily.     Marland Kitchen guaiFENesin (MUCINEX) 600 MG 12 hr tablet Take 600 mg by mouth 2 (two)  times daily.    Marland Kitchen HYDROcodone-acetaminophen (NORCO/VICODIN) 5-325 MG per tablet Take 1-2 tablets by mouth every 4 (four) hours as needed for moderate pain. 360 tablet 0  . ipratropium (ATROVENT) 0.02 % nebulizer solution Take 2.5 mLs (0.5 mg total) by nebulization 4 (four) times daily. 300 mL 6  . isosorbide mononitrate (IMDUR) 60 MG 24 hr tablet TAKE 1 TABLET BY MOUTH EVERY DAY 90 tablet 1  . levothyroxine (SYNTHROID, LEVOTHROID) 88 MCG tablet Take 88 mcg by mouth daily before breakfast.    . nitroGLYCERIN (NITROSTAT) 0.4 MG SL tablet Place 1 tablet (0.4 mg total) under the tongue every 5 (five) minutes as needed for chest pain. 25 tablet 3  . predniSONE (DELTASONE) 10 MG tablet TAKE 1 TABLET BY MOUTH EVERY DAY 30 tablet 2  . VENTOLIN HFA 108 (90 BASE) MCG/ACT inhaler INHALE 2 PUFFS BY MOUTH EVERY 6 HOURS AS NEEDED 18 Inhaler 0  . warfarin (COUMADIN) 3 MG tablet 1 by mouth daily EXCEPT 1 1/2 on Tuesday    . digoxin (LANOXIN) 0.125 MG tablet Take 0.125 mg by mouth daily.     No current facility-administered medications on file prior to visit.    Physical exam BP 130/72 mmHg  Pulse 80  Temp(Src) 97.9 F (36.6 C) (Oral)  Resp 12  Ht  (1.803 m)  Wt 160 lb (72.576 kg)  BMI 22.33 kg/m2  SpO2 86%  General: thin, frail elderly male in no acute distress Head:atraumatic, normocephalic, no pallor or icterus Eyes: PERRLA, EOMI Oropharynx: no erythema, moist mucus membrane Neck: Supple, no palpable LAD Pulmonary: poor air entry but no wheeze or rhonchi, no crackles, on o2 by nasal canula Cardiac: RRR, Normal S1, S2, no Murmurs, rubs or gallops Gastrointestinal: soft, non tender, no distension, no masses Musculoskeletal: kyphosis present, no vertebral or paravertebral tenderness noted, stooped posture, small steps taken, using a walker Neurologic: no focal deficit Psychiatric: Judgment intact, Mood & affect appropriate  Assessment/plan  Chronic respiratory failure with  hypoxia Persists and decline anticipated. Continue o2 continuously with his bronchodilators and chronic prednisone.   C O P D On symbicort and ventolin with prn albuterol and o2. Monitor clinically. Continue prednisone. Continue xanax current regimen for anxiety   Constipation Continue amitiza  afib Rate controlled, continue coreg and cardizem with warfarin for anticoagulation. Continue digoxin  HTN bp controlled, continue imdur with lasix  Hypothyroidism Continue levothyroxine for now, current regimen  Lumbar spondylosis Pain under control at present, continue his fentanyl patch with neurontin  Spoke with patient and his family, it is difficult for family to bring the patient to clinic appointment due to his decline and weakness. Would like to continue care at home if possible. i agree with this and would like for pt to be followed by hospice  physician from here on

## 2014-10-19 ENCOUNTER — Telehealth: Payer: Self-pay | Admitting: Pulmonary Disease

## 2014-10-19 NOTE — Telephone Encounter (Signed)
Patients caregiver, Ron and Hospice nurse, Pam notified to give patient 20mg  of Lasix.  Pam says that she will check up on patient tomorrow.  Nothing further needed.

## 2014-10-19 NOTE — Telephone Encounter (Signed)
Called and spoke with Toniann FailWendy from hospice and she took the VO from VS for the dates needed at [facility].  Nothing further is needed.

## 2014-10-19 NOTE — Telephone Encounter (Signed)
VS please advise if you are ok to send this order to [facility] for the dates request.  thanks

## 2014-10-19 NOTE — Telephone Encounter (Signed)
He can take extra 20 mg of lasix x one today.

## 2014-10-19 NOTE — Telephone Encounter (Signed)
Spoke to Hospice nurse: Elita QuickPam, she said she was called out to patient's home.  Patient was having trouble urinating.  Pam said that when she got there, he was urinating fine.  Pam collected urine to be tested.  Pam also said that he is having more trouble with SOB, anytime he stands up he is "panting".  Pam says that the edema has gone from 2+ to 3+ in lower extremities.  She can hearing wheezing interior, but posterior she cannot hear anything.  He is going to a Nursing facility while family is out of town.  He will be going there on Sunday 10/21/2014.  He will be there for 6 days and his family would like him to be stable before going to the temporary stay rest home.

## 2014-10-19 NOTE — Telephone Encounter (Signed)
Spoke with Hospice nurse, offered an appointment, she is not with patient and she asked me to contact Caregiver, Ron at [phone].  I called Ron, offered an appointment and he said that he is across town and cannot get here in time before office closes.  Attempted to offer appointment for Monday, however, next week, patient's family will be out of town, starting Sunday, patient will be staying at a temporary nursing facility while family is out of town.  Family will not return for 6 days. Would like to know if Dr. Craige CottaSood can provide further recommendations given situation. Dr. Craige CottaSood, Please advise.

## 2014-10-19 NOTE — Telephone Encounter (Signed)
Okay to send order. 

## 2014-10-19 NOTE — Telephone Encounter (Signed)
He needs to have office visit with Tammy Parrett to assess.

## 2014-10-24 ENCOUNTER — Telehealth: Payer: Self-pay | Admitting: Pulmonary Disease

## 2014-10-24 ENCOUNTER — Other Ambulatory Visit: Payer: Self-pay | Admitting: Internal Medicine

## 2014-10-24 NOTE — Telephone Encounter (Signed)
Pt is too weak to come in for an ov.  VS please advise.  Thank you.

## 2014-10-24 NOTE — Telephone Encounter (Signed)
We have been getting several calls per week from [place] regarding Mr. Travis Bond.  It is becoming difficult to coordinate care over the phone.  He really needs to come to office for ROV to have better assessment.

## 2014-10-24 NOTE — Telephone Encounter (Signed)
Spoke with Travis Bond at hospice, states pt is having chest congestion, increased sob, increased sputum production with brown mucus X3 days.  Travis BosworthCarlos also wants to know if it's ok for pt to use saline nasal spray.  Please call Travis BosworthCarlos with any recs- not sure what pharmacy meds need to be sent to at this time.    VS please advise.  Thank you!

## 2014-10-24 NOTE — Telephone Encounter (Signed)
[name] called back. Gave verbal to Johny DrillingChan to scheduled pt. Nothing further was needed.

## 2014-10-24 NOTE — Telephone Encounter (Signed)
lmtcb X1 for Travis Bond  VS has an ov opening at 2:15 if pt can make this.  Otherwise, pt needs to be scheduled for next available appt with VS or TP.

## 2014-10-24 NOTE — Telephone Encounter (Signed)
If he is doing that poorly, then he should probably go to hospital >> could then assess for inpatient hospice placement.  Again, this is becoming to complicated to try to manage over the phone.

## 2014-10-24 NOTE — Telephone Encounter (Signed)
Spoke with Travis Bond. Aware of rec's per Dr Craige CottaSood. Nothing further needed.

## 2014-10-31 ENCOUNTER — Telehealth: Payer: Self-pay | Admitting: Pulmonary Disease

## 2014-10-31 NOTE — Telephone Encounter (Signed)
Called spoke with [name] and made aware of recs. Nothing further needed

## 2014-10-31 NOTE — Telephone Encounter (Signed)
As stated in previous phone messages he needs to come to office or go to hospital if he is too weak to come to office.

## 2014-10-31 NOTE — Telephone Encounter (Signed)
Spoke with Travis Bond. States that pt is very congested, sounds like " boiling water." Travis Bond feels like his lasix needs to increased. Thinks there might be fluid in his lungs.  VS  - please advise. Thanks.

## 2014-11-02 ENCOUNTER — Emergency Department (HOSPITAL_COMMUNITY)

## 2014-11-02 ENCOUNTER — Telehealth: Payer: Self-pay | Admitting: Pulmonary Disease

## 2014-11-02 ENCOUNTER — Emergency Department (HOSPITAL_COMMUNITY)
Admission: EM | Admit: 2014-11-02 | Discharge: 2014-11-02 | Disposition: A | Discharge status: Home / Self Care | Attending: Emergency Medicine | Admitting: Emergency Medicine

## 2014-11-02 ENCOUNTER — Encounter (HOSPITAL_COMMUNITY): Payer: Self-pay | Admitting: Emergency Medicine

## 2014-11-02 DIAGNOSIS — R609 Edema, unspecified: Secondary | ICD-10-CM | POA: Diagnosis not present

## 2014-11-02 DIAGNOSIS — J159 Unspecified bacterial pneumonia: Secondary | ICD-10-CM | POA: Insufficient documentation

## 2014-11-02 DIAGNOSIS — G629 Polyneuropathy, unspecified: Secondary | ICD-10-CM | POA: Insufficient documentation

## 2014-11-02 DIAGNOSIS — Z87442 Personal history of urinary calculi: Secondary | ICD-10-CM | POA: Insufficient documentation

## 2014-11-02 DIAGNOSIS — I2510 Atherosclerotic heart disease of native coronary artery without angina pectoris: Secondary | ICD-10-CM | POA: Diagnosis not present

## 2014-11-02 DIAGNOSIS — E119 Type 2 diabetes mellitus without complications: Secondary | ICD-10-CM | POA: Diagnosis not present

## 2014-11-02 DIAGNOSIS — R000 Tachycardia, unspecified: Secondary | ICD-10-CM | POA: Insufficient documentation

## 2014-11-02 DIAGNOSIS — Z7951 Long term (current) use of inhaled steroids: Secondary | ICD-10-CM | POA: Diagnosis not present

## 2014-11-02 DIAGNOSIS — R05 Cough: Secondary | ICD-10-CM

## 2014-11-02 DIAGNOSIS — R059 Cough, unspecified: Secondary | ICD-10-CM

## 2014-11-02 DIAGNOSIS — F419 Anxiety disorder, unspecified: Secondary | ICD-10-CM | POA: Insufficient documentation

## 2014-11-02 DIAGNOSIS — Z79899 Other long term (current) drug therapy: Secondary | ICD-10-CM | POA: Diagnosis not present

## 2014-11-02 DIAGNOSIS — E785 Hyperlipidemia, unspecified: Secondary | ICD-10-CM | POA: Diagnosis not present

## 2014-11-02 DIAGNOSIS — R042 Hemoptysis: Secondary | ICD-10-CM | POA: Diagnosis present

## 2014-11-02 DIAGNOSIS — I129 Hypertensive chronic kidney disease with stage 1 through stage 4 chronic kidney disease, or unspecified chronic kidney disease: Secondary | ICD-10-CM | POA: Diagnosis not present

## 2014-11-02 DIAGNOSIS — N189 Chronic kidney disease, unspecified: Secondary | ICD-10-CM | POA: Diagnosis not present

## 2014-11-02 DIAGNOSIS — Z7901 Long term (current) use of anticoagulants: Secondary | ICD-10-CM | POA: Diagnosis not present

## 2014-11-02 DIAGNOSIS — Z7952 Long term (current) use of systemic steroids: Secondary | ICD-10-CM | POA: Insufficient documentation

## 2014-11-02 DIAGNOSIS — Z862 Personal history of diseases of the blood and blood-forming organs and certain disorders involving the immune mechanism: Secondary | ICD-10-CM | POA: Diagnosis not present

## 2014-11-02 DIAGNOSIS — J449 Chronic obstructive pulmonary disease, unspecified: Secondary | ICD-10-CM | POA: Insufficient documentation

## 2014-11-02 DIAGNOSIS — Z87891 Personal history of nicotine dependence: Secondary | ICD-10-CM | POA: Insufficient documentation

## 2014-11-02 DIAGNOSIS — J189 Pneumonia, unspecified organism: Secondary | ICD-10-CM

## 2014-11-02 DIAGNOSIS — E039 Hypothyroidism, unspecified: Secondary | ICD-10-CM | POA: Diagnosis not present

## 2014-11-02 LAB — CBC WITH DIFFERENTIAL/PLATELET
BASOS ABS: 0 10*3/uL (ref 0.0–0.1)
Basophils Relative: 0 % (ref 0–1)
Eosinophils Absolute: 0.1 10*3/uL (ref 0.0–0.7)
Eosinophils Relative: 1 % (ref 0–5)
HEMATOCRIT: 38.3 % — AB (ref 39.0–52.0)
HEMOGLOBIN: 12.2 g/dL — AB (ref 13.0–17.0)
LYMPHS ABS: 1.8 10*3/uL (ref 0.7–4.0)
Lymphocytes Relative: 17 % (ref 12–46)
MCH: 32 pg (ref 26.0–34.0)
MCHC: 31.9 g/dL (ref 30.0–36.0)
MCV: 100.5 fL — AB (ref 78.0–100.0)
MONO ABS: 0.8 10*3/uL (ref 0.1–1.0)
Monocytes Relative: 7 % (ref 3–12)
NEUTROS PCT: 75 % (ref 43–77)
Neutro Abs: 8.1 10*3/uL — ABNORMAL HIGH (ref 1.7–7.7)
Platelets: 161 10*3/uL (ref 150–400)
RBC: 3.81 MIL/uL — AB (ref 4.22–5.81)
RDW: 16.9 % — ABNORMAL HIGH (ref 11.5–15.5)
WBC: 10.8 10*3/uL — ABNORMAL HIGH (ref 4.0–10.5)

## 2014-11-02 LAB — BASIC METABOLIC PANEL
ANION GAP: 10 (ref 5–15)
BUN: 21 mg/dL (ref 6–23)
CO2: 33 mmol/L — ABNORMAL HIGH (ref 19–32)
Calcium: 8.2 mg/dL — ABNORMAL LOW (ref 8.4–10.5)
Chloride: 92 mmol/L — ABNORMAL LOW (ref 96–112)
Creatinine, Ser: 0.98 mg/dL (ref 0.50–1.35)
GFR calc Af Amer: 84 mL/min — ABNORMAL LOW (ref >90)
GFR, EST NON AFRICAN AMERICAN: 73 mL/min — AB (ref >90)
Glucose, Bld: 145 mg/dL — ABNORMAL HIGH (ref 70–99)
POTASSIUM: 3.7 mmol/L (ref 3.5–5.1)
SODIUM: 135 mmol/L (ref 135–145)

## 2014-11-02 LAB — BRAIN NATRIURETIC PEPTIDE: B Natriuretic Peptide: 199.1 pg/mL — ABNORMAL HIGH (ref 0.0–100.0)

## 2014-11-02 LAB — PROTIME-INR
INR: 2.21 — AB (ref 0.00–1.49)
PROTHROMBIN TIME: 24.8 s — AB (ref 11.6–15.2)

## 2014-11-02 MED ORDER — FUROSEMIDE 10 MG/ML IJ SOLN
40.0000 mg | INTRAMUSCULAR | Status: AC
  Administered 2014-11-02: 40 mg via INTRAVENOUS
  Filled 2014-11-02: qty 4

## 2014-11-02 MED ORDER — AMOXICILLIN-POT CLAVULANATE 875-125 MG PO TABS: 1.0000 | ORAL_TABLET | Freq: Two times a day (BID) | ORAL | Status: DC

## 2014-11-02 MED ORDER — IPRATROPIUM-ALBUTEROL 0.5-2.5 (3) MG/3ML IN SOLN
3.0000 mL | Freq: Once | RESPIRATORY_TRACT | Status: AC
  Administered 2014-11-02: 3 mL via RESPIRATORY_TRACT
  Filled 2014-11-02: qty 3

## 2014-11-02 NOTE — ED Notes (Signed)
PA at bedside.

## 2014-11-02 NOTE — Telephone Encounter (Signed)
Spoke with [name]. States that the patient is just now going to hospital. [facility].  Mikle BosworthCarlos states that the patient does not want any further hospitalizations after this one and states that he plans to sit down with family this week and talk about further care.  Will send to to Dr Craige CottaSood as Lorain ChildesFYI per [facility] request.

## 2014-11-02 NOTE — Discharge Instructions (Signed)
Please follow up with your primary care physician in 1-2 days. If you do not have one please call the [facility] and wellness Center number listed above. Please follow up with Dr. Craige Cotta to schedule a follow up appointment.  Please take your antibiotic until completion. Please read all discharge instructions and return precautions.   Pneumonia Pneumonia is an infection of the lungs.  CAUSES Pneumonia may be caused by bacteria or a virus. Usually, these infections are caused by breathing infectious particles into the lungs (respiratory tract). SIGNS AND SYMPTOMS   Cough.  Fever.  Chest pain.  Increased rate of breathing.  Wheezing.  Mucus production. DIAGNOSIS  If you have the common symptoms of pneumonia, your health care provider will typically confirm the diagnosis with a chest X-ray. The X-ray will show an abnormality in the lung (pulmonary infiltrate) if you have pneumonia. Other tests of your blood, urine, or sputum may be done to find the specific cause of your pneumonia. Your health care provider may also do tests (blood gases or pulse oximetry) to see how well your lungs are working. TREATMENT  Some forms of pneumonia may be spread to other people when you cough or sneeze. You may be asked to wear a mask before and during your exam. Pneumonia that is caused by bacteria is treated with antibiotic medicine. Pneumonia that is caused by the influenza virus may be treated with an antiviral medicine. Most other viral infections must run their course. These infections will not respond to antibiotics.  HOME CARE INSTRUCTIONS   Cough suppressants may be used if you are losing too much rest. However, coughing protects you by clearing your lungs. You should avoid using cough suppressants if you can.  Your health care provider may have prescribed medicine if he or she thinks your pneumonia is caused by bacteria or influenza. Finish your medicine even if you start to feel better.  Your health  care provider may also prescribe an expectorant. This loosens the mucus to be coughed up.  Take medicines only as directed by your health care provider.  Do not smoke. Smoking is a common cause of bronchitis and can contribute to pneumonia. If you are a smoker and continue to smoke, your cough may last several weeks after your pneumonia has cleared.  A cold steam vaporizer or humidifier in your room or home may help loosen mucus.  Coughing is often worse at night. Sleeping in a semi-upright position in a recliner or using a couple pillows under your head will help with this.  Get rest as you feel it is needed. Your body will usually let you know when you need to rest. PREVENTION A pneumococcal shot (vaccine) is available to prevent a common bacterial cause of pneumonia. This is usually suggested for:  People over 28 years old.  Patients on chemotherapy.  People with chronic lung problems, such as bronchitis or emphysema.  People with immune system problems. If you are over 65 or have a high risk condition, you may receive the pneumococcal vaccine if you have not received it before. In some countries, a routine influenza vaccine is also recommended. This vaccine can help prevent some cases of pneumonia.You may be offered the influenza vaccine as part of your care. If you smoke, it is time to quit. You may receive instructions on how to stop smoking. Your health care provider can provide medicines and counseling to help you quit. SEEK MEDICAL CARE IF: You have a fever. SEEK IMMEDIATE MEDICAL CARE  IF:   Your illness becomes worse. This is especially true if you are elderly or weakened from any other disease.  You cannot control your cough with suppressants and are losing sleep.  You begin coughing up blood.  You develop pain which is getting worse or is uncontrolled with medicines.  Any of the symptoms which initially brought you in for treatment are getting worse rather than  better.  You develop shortness of breath or chest pain. MAKE SURE YOU:   Understand these instructions.  Will watch your condition.  Will get help right away if you are not doing well or get worse. Document Released: 08/17/2005 Document Revised: 01/01/2014 Document Reviewed: 11/06/2010 [facility] Patient Information 2015 [employer] [place]. This information is not intended to replace advice given to you by your health care provider. Make sure you discuss any questions you have with your health care provider.

## 2014-11-02 NOTE — ED Notes (Signed)
PTAR contacted to return patient home 

## 2014-11-02 NOTE — Telephone Encounter (Signed)
Please ensure that Mr. Salome ArntMcGalliard is taken to the ER.  The ER physician can assess what lab testing is needed.

## 2014-11-02 NOTE — ED Provider Notes (Signed)
CSN: [id number]     Arrival date & time 11/02/14  1738 History   First MD Initiated Contact with Patient 11/02/14 1743     Chief Complaint  Patient presents with  . Hemoptysis     (Consider location/radiation/quality/duration/timing/severity/associated sxs/prior Treatment) HPI Comments: Patient is an 81 year old male past medical history significant for chronic respiratory failure with hypoxia, COPD on home oxygen, pulmonary fibrosis, CAD, HTN, HLD, PVD, hypothyroidism, atrial fibrillation on Coumadin and rate control medicines presenting to the emergency department from home at the advice of his home hospice nurse for evaluation of productive cough with blood tinged sputum. Patient states that he has had a chronic cough, recently finished a Z-Pak on March 1 for pneumonia. He states he has been feeling well. He denies any fevers, chills, chest pain, shortness of breath, increase in his home O2 requirement. He does note that he has had increased bilateral lower swelling has improved since increasing his Lasix to 40 mg daily. States he came to the hospital for evaluation because home health nurse called his pulmonologist advised him to come. Denies any other changes to his medications recently. Patient is on hospice at home for COPD.    Past Medical History  Diagnosis Date  . COPD (chronic obstructive pulmonary disease)   . Chronic respiratory failure with hypoxia   . Pulmonary fibrosis   . Coronary artery disease   . Hypertension   . Hyperlipidemia   . Carotid stenosis   . Peripheral vascular disease   . Anemia   . Peripheral neuropathy   . B12 deficiency   . Pneumonia   . Atrial fibrillation   . Hypothyroidism   . Anxiety state, unspecified   . Unspecified deficiency anemia   . Abdominal aneurysm without mention of rupture   . Aortic valve disorders   . Type II or unspecified type diabetes mellitus without mention of complication, not stated as uncontrolled   . Unspecified hereditary  and idiopathic peripheral neuropathy   . Microscopic hematuria   . Chronic kidney disease     Kidney stones   Past Surgical History  Procedure Laterality Date  . Inguinal hernia repair  1994  . Cardioversion  09/21/2011    Procedure: CARDIOVERSION;  Surgeon: Darden Palmer., MD;  Location: [facility] OR;  Service: Cardiovascular;  Laterality: N/A;  . Cardiac stent  2010 or 2011 pt not sure  . Hernia repair  1989   Family History  Problem Relation Age of Onset  . Heart failure Father   . Heart failure Mother   . Diabetes Mother   . Coronary artery disease Brother   . Diabetes Brother   . Heart disease Brother   . Hypertension Brother    History  Substance Use Topics  . Smoking status: Former Smoker -- 1.00 packs/day for 65 years    Types: Cigarettes    Quit date: 01/18/2010  . Smokeless tobacco: Never Used  . Alcohol Use: No    Review of Systems  Respiratory: Positive for cough.   All other systems reviewed and are negative.     Allergies  Tramadol  Home Medications   Prior to Admission medications   Medication Sig Start Date End Date Taking? Authorizing Provider  albuterol (PROVENTIL HFA;VENTOLIN HFA) 108 (90 BASE) MCG/ACT inhaler Inhale 2 puffs into the lungs every 6 (six) hours as needed for wheezing or shortness of breath. 10/15/14   Coralyn Helling, MD  albuterol (PROVENTIL) (2.5 MG/3ML) 0.083% nebulizer solution Take 3 mLs (2.5 mg  total) by nebulization every 2 (two) hours as needed for wheezing or shortness of breath. 10/15/14   Coralyn HellingVineet Sood, MD  ALPRAZolam Prudy Feeler(XANAX) 0.25 MG tablet Take one tablet by mouth every night at bedtime at 11:30pm; Take one tablet by mouth twice daily as needed for anxiety  HOSPICE PT 10/12/14   Nyoka CowdenMichael B Wert, MD  AMITIZA 24 MCG capsule TAKE ONE CAPSULE BY MOUTH TWICE A DAY 08/06/14   Oneal GroutMahima Pandey, MD  amoxicillin-clavulanate (AUGMENTIN) 875-125 MG per tablet Take 1 tablet by mouth every 12 (twelve) hours. 11/02/14   Wylan Gentzler L Myranda Pavone, PA-C   atorvastatin (LIPITOR) 10 MG tablet TAKE 1 TABLET BY MOUTH EVERY DAY 10/24/14   Monica Caro LarocheShamsid-Deen Carter, DO  budesonide-formoterol [facility](SYMBICORT) 160-4.5 MCG/ACT inhaler Inhale 2 puffs into the lungs 2 (two) times daily. 10/15/14   Coralyn HellingVineet Sood, MD  carvedilol (COREG) 3.125 MG tablet Take 3.125 mg by mouth 2 (two) times daily with a meal.  10/26/13   Historical Provider, MD  digoxin (LANOXIN) 0.125 MG tablet Take 0.125 mg by mouth daily.    Historical Provider, MD  diltiazem (CARDIZEM) 30 MG tablet TAKE 1 TABLET BY MOUTH EVERY 6 HOURS 08/20/14   Oneal GroutMahima Pandey, MD  fentaNYL (DURAGESIC - DOSED MCG/HR) 25 MCG/HR patch Place 1 patch (25 mcg total) onto the skin every 3 (three) days. For pain   HOSPICE PT 10/12/14   Nyoka CowdenMichael B Wert, MD  furosemide (LASIX) 20 MG tablet TAKE 1 TABLET BY MOUTH EVERY DAY 10/01/14   Oneal GroutMahima Pandey, MD  gabapentin (NEURONTIN) 300 MG capsule Take 300 mg by mouth 2 (two) times daily.     Historical Provider, MD  guaiFENesin (MUCINEX) 600 MG 12 hr tablet Take 600 mg by mouth 2 (two) times daily.    Historical Provider, MD  HYDROcodone-acetaminophen (NORCO/VICODIN) 5-325 MG per tablet Take 1-2 tablets by mouth every 4 (four) hours as needed for moderate pain. 10/08/14   Coralyn HellingVineet Sood, MD  ipratropium (ATROVENT) 0.02 % nebulizer solution Take 2.5 mLs (0.5 mg total) by nebulization 4 (four) times daily. 08/13/14   Coralyn HellingVineet Sood, MD  isosorbide mononitrate (IMDUR) 60 MG 24 hr tablet TAKE 1 TABLET BY MOUTH EVERY DAY 10/12/14   Oneal GroutMahima Pandey, MD  levothyroxine (SYNTHROID, LEVOTHROID) 88 MCG tablet Take 88 mcg by mouth daily before breakfast.    Historical Provider, MD  nitroGLYCERIN (NITROSTAT) 0.4 MG SL tablet Place 1 tablet (0.4 mg total) under the tongue every 5 (five) minutes as needed for chest pain. 08/28/14   Mahima Glade LloydPandey, MD  predniSONE (DELTASONE) 10 MG tablet TAKE 1 TABLET BY MOUTH EVERY DAY 08/06/14   Mahima Glade LloydPandey, MD  VENTOLIN HFA 108 (90 BASE) MCG/ACT inhaler INHALE 2 PUFFS BY MOUTH  EVERY 6 HOURS AS NEEDED 10/15/14   Coralyn HellingVineet Sood, MD  warfarin (COUMADIN) 3 MG tablet 1 by mouth daily EXCEPT 1 1/2 on Tuesday    Historical Provider, MD   BP 127/86 mmHg  Pulse 84  Temp(Src) 98.8 F (37.1 C) (Oral)  Resp 15  SpO2 98% Physical Exam  Constitutional: He is oriented to person, place, and time. He appears well-developed and well-nourished. Nasal cannula in place.  Chronically ill appearing  HENT:  Head: Normocephalic and atraumatic.  Right Ear: External ear normal.  Left Ear: External ear normal.  Nose: Nose normal.  Mouth/Throat: Oropharynx is clear and moist. No oropharyngeal exudate.  Eyes: Conjunctivae are normal.  Neck: Neck supple.  Cardiovascular: Normal heart sounds and intact distal pulses.  An irregularly irregular rhythm present.  Tachycardia present.   Pulmonary/Chest: Effort normal. No tachypnea. No respiratory distress. He exhibits no tenderness.  Coarse breath sounds   Abdominal: Soft. There is no tenderness.  Musculoskeletal: Normal range of motion. He exhibits edema (3+ pitting BLE).  Neurological: He is alert and oriented to person, place, and time.  Skin: Skin is warm and dry.    ED Course  Procedures (including critical care time) Medications  furosemide (LASIX) injection 40 mg (40 mg Intravenous Given 11/02/14 1905)  ipratropium-albuterol (DUONEB) 0.5-2.5 (3) MG/3ML nebulizer solution 3 mL (3 mLs Nebulization Given 11/02/14 2007)    Labs Review Labs Reviewed  BRAIN NATRIURETIC PEPTIDE - Abnormal; Notable for the following:    B Natriuretic Peptide 199.1 (*)    All other components within normal limits  BASIC METABOLIC PANEL - Abnormal; Notable for the following:    Chloride 92 (*)    CO2 33 (*)    Glucose, Bld 145 (*)    Calcium 8.2 (*)    GFR calc non Af Amer 73 (*)    GFR calc Af Amer 84 (*)    All other components within normal limits  CBC WITH DIFFERENTIAL/PLATELET - Abnormal; Notable for the following:    WBC 10.8 (*)    RBC 3.81 (*)     Hemoglobin 12.2 (*)    HCT 38.3 (*)    MCV 100.5 (*)    RDW 16.9 (*)    Neutro Abs 8.1 (*)    All other components within normal limits  PROTIME-INR - Abnormal; Notable for the following:    Prothrombin Time 24.8 (*)    INR 2.21 (*)    All other components within normal limits    Imaging Review Dg Chest Port 1 View  11/02/2014   CLINICAL DATA:  Acute onset of cough and wheezing for 2 days. Initial encounter.  EXAM: PORTABLE CHEST - 1 VIEW  COMPARISON:  Chest radiograph performed 07/04/2014  FINDINGS: The lungs are well-aerated. Mild bibasilar opacities may reflect atelectasis or possibly pneumonia. There is no evidence of pleural effusion or pneumothorax.  The cardiomediastinal silhouette is borderline normal in size. No acute osseous abnormalities are seen.  IMPRESSION: Mild bibasilar airspace opacities may reflect atelectasis or possibly pneumonia.   Electronically Signed   By: Roanna Raider M.D.   On: 11/02/2014 18:31     EKG Interpretation   Date/Time:  Friday November 02 2014 17:54:06 EST Ventricular Rate:  101 PR Interval:    QRS Duration: 83 QT Interval:  326 QTC Calculation: 422 R Axis:   85 Text Interpretation:  Atrial fibrillation Borderline right axis deviation  Borderline repolarization abnormality No significant change since last  tracing Confirmed by HARRISON  MD, FORREST ([id number]) on 11/02/2014 6:14:45 PM         MDM   Final diagnoses:  Cough  CAP (community acquired pneumonia)    Filed Vitals:   11/02/14 2115  BP: 127/86  Pulse: 84  Temp:   Resp: 15   I have reviewed nursing notes, vital signs, and all appropriate lab and imaging results for this patient.  Extensive conversation with family and patient regarding possibility of hemoptysis including but not limited to infection, cancer, PE. Did discuss that there was possible lingular pneumonia, chest x-ray. Did discuss for further investigation of possible tumors or pulmonary embolism would require a CT  angiogram of the chest to begin evaluation.  At this time patient does not wish to pursue other possible etiologies of his hemoptysis. His hemoglobin and  hematocrit are also stable at this time.Marland Kitchen He also wishes to be discharged home on another trial of PO antibiotics and continuing his lasix at 40mg  daily for his BLE edema secondary to his CHF. Discussed the risks and benefits of this treatment, patient and family understand. I feel that it is appropriate to try this treatment with patient given his wishes and beliefs on Hospice, along with no other acute change in his chronic medical problems aside from increased sputum production with hemoptysis. Advised PCP, Hospice, and pulmonology follow up. Return precautions discussed. Patient is agreeable to plan. Patient is stable at time of discharge. Patient d/w with Dr. Romeo Apple, agrees with plan.      Jeannetta Ellis, PA-C 11/03/14 0022  Purvis Sheffield, MD 11/03/14 [phone]

## 2014-11-02 NOTE — ED Notes (Addendum)
Pt brought to ED via EMS due to coughing up blood. Pt pulmonologist recommended him to come to ED for eval. Pt in end stage COPD - entered  Into hospice in Nov. According to EMS pt has experienced gradual decline with pulmonary edema and pneumonia. Pt on 2.5 L O2, daily. sats at 95%. Fentanyl patch present for chronic pain. Pt has hx of a fib. Pt alert and oriented.

## 2014-11-02 NOTE — Telephone Encounter (Signed)
Spoke with Travis Bond Patient started coughing up blood this morning (mixed with sputum) Pt mucus is still brown.  Increased SOB.  Hospice MD recommended that the patient increase his Lasix this week (40mg  daily) - help with the swelling but dyspnea still bad Prednisone was completed 10/28/14 Antibiotic completed ZPAK 10/30/2014 I advised Travis Bond to take patient to the ED to be evaluated.  Aware that I will let Dr Craige CottaSood know this is what I recommended.   Will send to Dr Craige CottaSood as Lorain ChildesFYI.  Travis Bond is wanting to know if PT/INR needs to be done ASAP upon arrival to ED.

## 2014-11-03 NOTE — Telephone Encounter (Signed)
Noted  

## 2014-11-09 ENCOUNTER — Other Ambulatory Visit: Payer: Self-pay | Admitting: Internal Medicine

## 2014-11-09 ENCOUNTER — Other Ambulatory Visit: Payer: Self-pay | Admitting: Adult Health

## 2014-11-13 ENCOUNTER — Telehealth: Payer: Self-pay | Admitting: Pulmonary Disease

## 2014-11-13 NOTE — Telephone Encounter (Signed)
Called and spoke to Cutlerarlos and informed her of the VO for DNR. Carols verbalized understanding. Nothing further needed.

## 2014-11-13 NOTE — Telephone Encounter (Signed)
I called spoke with Travis Bond (hospice nurse). Social worker saw pt yesterday and is requesting DNR. Travis BosworthCarlos wanting a verbal order DNR and hospice doc will sign. Please advise thanks

## 2014-11-13 NOTE — Telephone Encounter (Signed)
That is fine 

## 2014-11-20 IMAGING — CR DG LUMBAR SPINE COMPLETE 4+V
5 series · 5 of 5 positions shown · non-contrast
Comparison: CT ABD/PELVIS W CM dated 11/14/2012; DG SACRUM/COCCYX
dated 01/03/2014; DG ABD 2 VIEWS dated 01/03/2014

CLINICAL DATA: Low back pain.  Pelvic pain.  Fall 12 days ago.

EXAM:
LUMBAR SPINE - COMPLETE 4+ VIEW

[t l-spine a.p.]
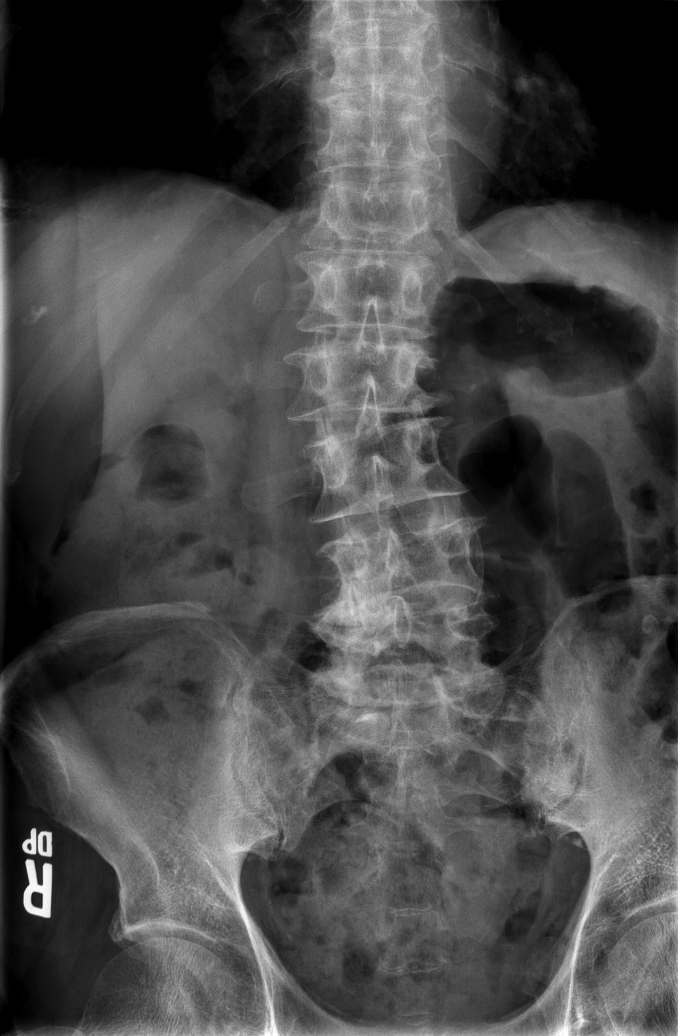

[t l-spine oblique exposure (1 of 2)]
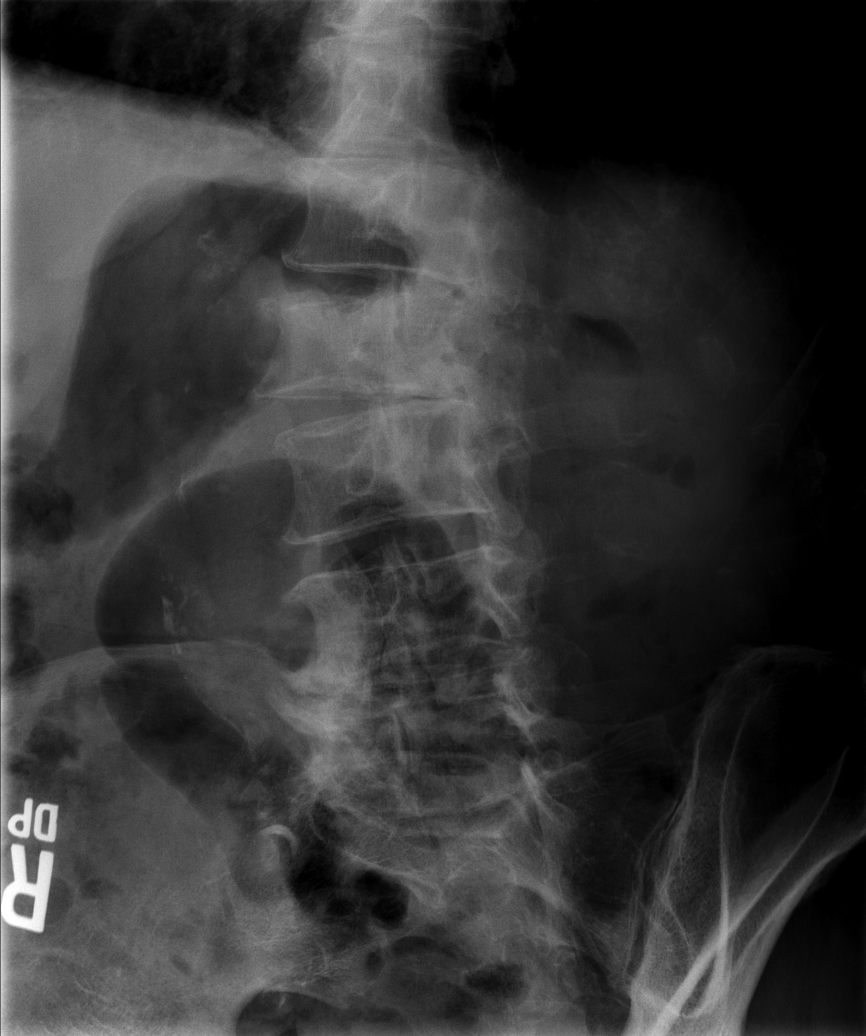

[t l-spine oblique exposure (2 of 2)]
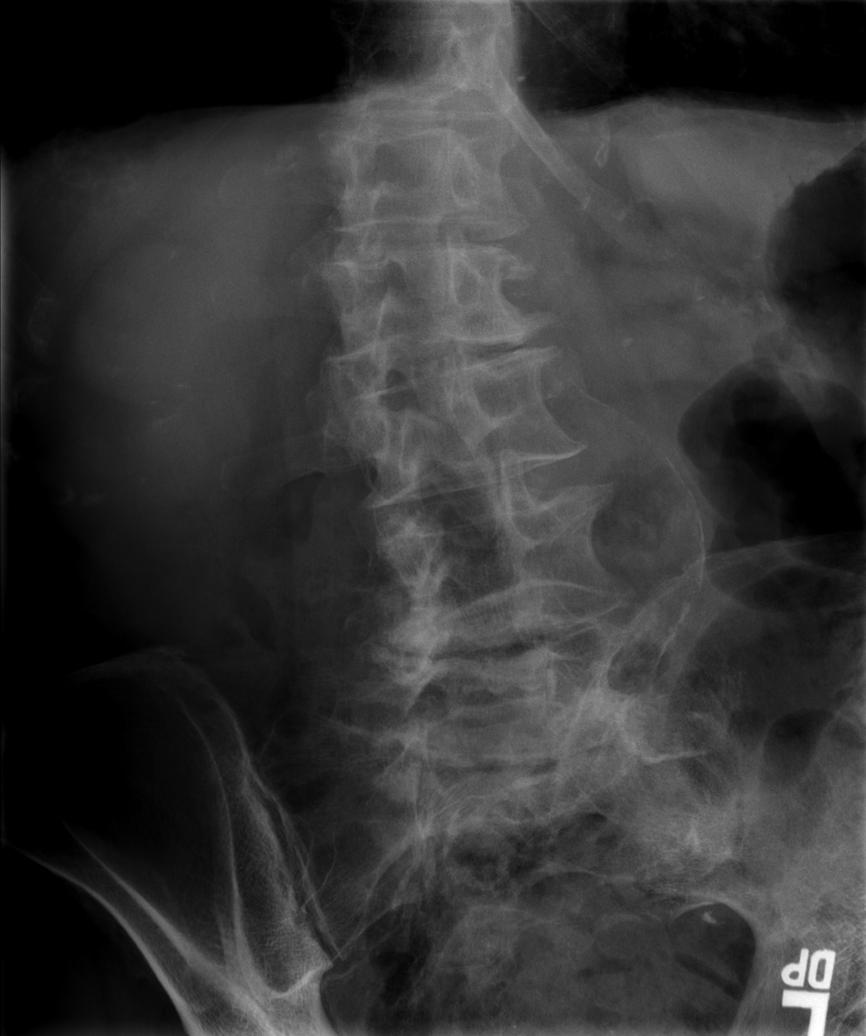

[t l-spine lat]
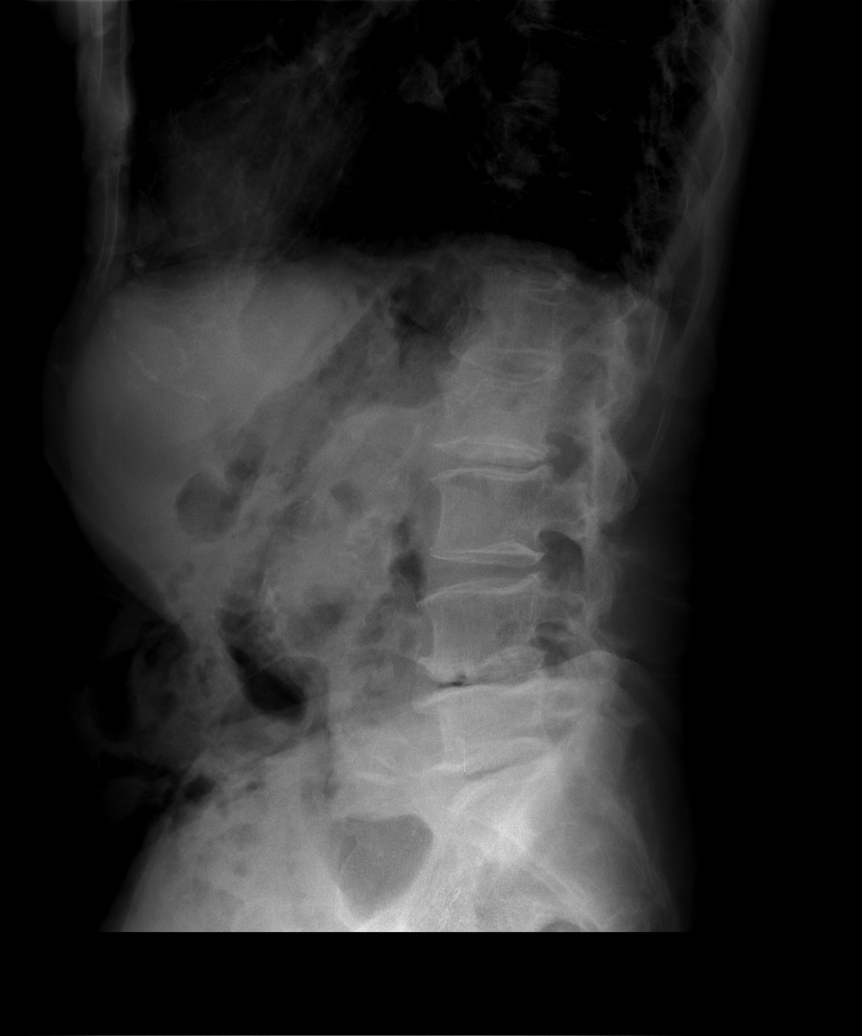

[t l-spine l5-s1 spot]
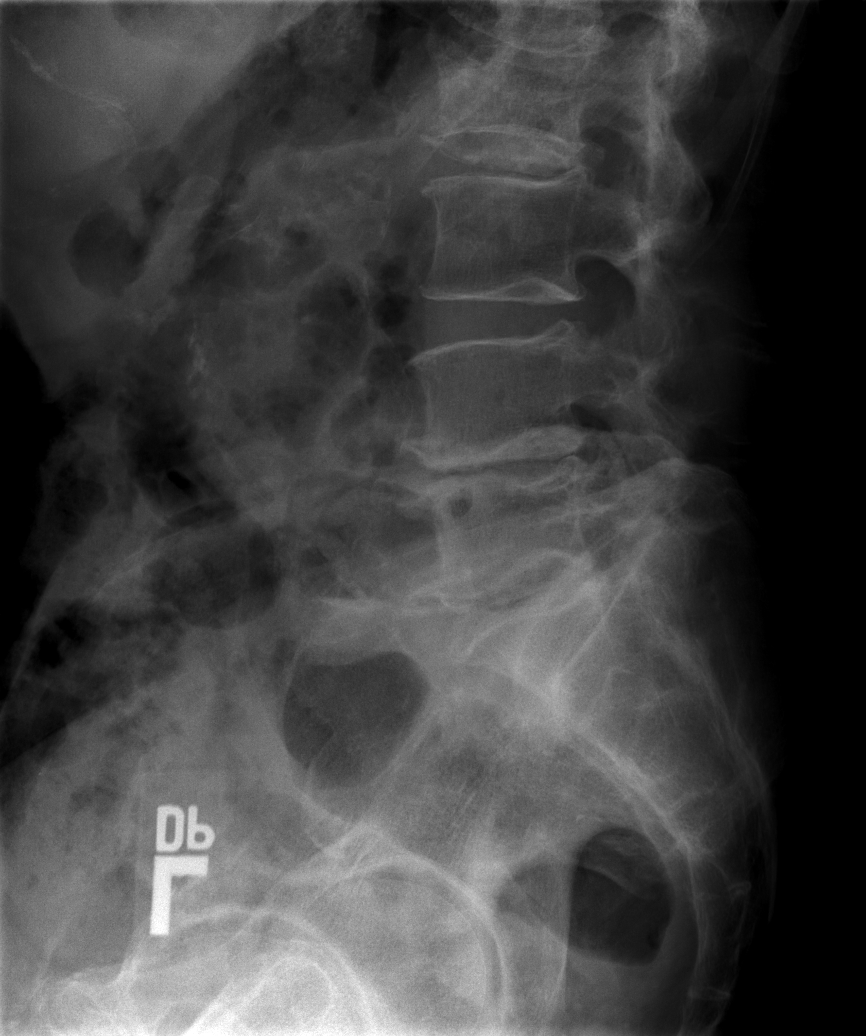

[5 of 5 positions shown; findings below may reference images not displayed]

FINDINGS: Dextroconvex lumbar scoliosis is present with compensatory are at
L4-L5. Five lumbar type vertebral bodies. No pars defects are
identified. Severe degenerative disc disease at L4-L5 and L5-S1. No
compression fractures are identified. Endplate osteophytes are
present at L4-L5 and L5-S1 with vacuum disc at both levels.
Asymmetric disc space collapse is present at L2-L3 on the left.
IMPRESSION: Moderate lower lumbar spondylosis, worst at L4-L5 and L5-S1.

## 2014-11-22 ENCOUNTER — Other Ambulatory Visit: Payer: Self-pay | Admitting: Internal Medicine

## 2014-11-29 ENCOUNTER — Other Ambulatory Visit: Payer: Self-pay | Admitting: Internal Medicine

## 2014-11-30 NOTE — Telephone Encounter (Signed)
Dr Calton GoldsSood okayed Rx but this needs to be called into pharmacy - it was printed in error.  [facility] and spoke with Pharmacist - phoned in Xanax Rx. Nothing further needed.

## 2014-11-30 NOTE — Telephone Encounter (Signed)
Dr Craige CottaSood, please advise if you are going to continue prescribing Xanax for the patient.  The patient is now under Hospice care.  Last prescribed 10/12/14 #90  Sig: Take one tablet by mouth every night at bedtime at 11:30pm; Take one tablet by mouth twice daily as needed for anxiety HOSPICE PT  Thanks.

## 2014-12-03 ENCOUNTER — Telehealth: Payer: Self-pay | Admitting: Pulmonary Disease

## 2014-12-03 MED ORDER — FENTANYL 25 MCG/HR TD PT72: 25.0000 ug | MEDICATED_PATCH | TRANSDERMAL | Status: DC

## 2014-12-03 NOTE — Telephone Encounter (Signed)
Called the number given and hospice is now closed  I had MW sign rx for fentanyl patch and I have [facility]ed [facility] [address]  Will hold in triage so they can call in the am to let them know this was don

## 2014-12-04 NOTE — Telephone Encounter (Signed)
[facility] and informed that [facility] for F[facility] [address]

## 2014-12-17 ENCOUNTER — Telehealth: Payer: Self-pay | Admitting: Pulmonary Disease

## 2014-12-17 ENCOUNTER — Other Ambulatory Visit: Payer: Self-pay | Admitting: Internal Medicine

## 2014-12-17 NOTE — Telephone Encounter (Signed)
Spoke with Mikle BosworthCarlos- states that she was needing an order for a U/A as the patient was having some complaints of urinary urgency and back pain. Urine was cloudy today but patient refused to allow Mikle BosworthCarlos to get a urine sample. States that over the weekend he increased his Cranberry juice and water intake and states that he is feeling much better. Denies dysuria. Pt has refused to do U/A at this time. Will call back if any symptoms return. Nothing further needed.

## 2014-12-19 ENCOUNTER — Other Ambulatory Visit: Payer: Self-pay | Admitting: *Deleted

## 2014-12-19 MED ORDER — DILTIAZEM HCL 30 MG PO TABS: 30.0000 mg | ORAL_TABLET | Freq: Four times a day (QID) | ORAL | Status: DC

## 2014-12-31 ENCOUNTER — Other Ambulatory Visit: Payer: Self-pay | Admitting: Adult Health

## 2015-01-03 ENCOUNTER — Encounter: Payer: Self-pay | Admitting: Vascular Surgery

## 2015-01-04 ENCOUNTER — Ambulatory Visit: Payer: Medicare Other | Admitting: Vascular Surgery

## 2015-01-04 ENCOUNTER — Telehealth: Payer: Self-pay | Admitting: Pulmonary Disease

## 2015-01-04 ENCOUNTER — Encounter (HOSPITAL_COMMUNITY): Payer: Medicare Other

## 2015-01-04 NOTE — Telephone Encounter (Signed)
Noted  

## 2015-01-14 ENCOUNTER — Telehealth: Payer: Self-pay | Admitting: Pulmonary Disease

## 2015-01-14 MED ORDER — PREDNISONE 10 MG PO TABS: ORAL_TABLET | ORAL | Status: DC

## 2015-01-14 NOTE — Telephone Encounter (Signed)
I skipped a step in my instructions.  He should take 4 pills daily for 2 days, 3 pills daily for 2 days, 2 pills daily for 2 days, then 1 pill daily.

## 2015-01-14 NOTE — Addendum Note (Signed)
Addended by: Gweneth DimitriJONES, Maiko Salais D on: 01/14/2015 04:19 PM   Modules accepted: Orders

## 2015-01-14 NOTE — Telephone Encounter (Signed)
Spoke with [name] - pt having increased SOB with any activity.  Requesting an order for Pred Taper - has not had this since February.  Pt has used Morphine once this week for the SOB.  Please advise Dr Craige CottaSood. Thanks.

## 2015-01-14 NOTE — Telephone Encounter (Signed)
Can send script for prednisone 10 mg pills >> 4 pills daily for 2 days 3 pills daily for 2 days, 1 pill daily for 2 days.  He should then resume his usual dose of prednisone.

## 2015-01-14 NOTE — Telephone Encounter (Signed)
Travis Bond returned call.  See additional phone msg from 01/14/15.

## 2015-01-14 NOTE — Telephone Encounter (Signed)
LM x 1 for [facility]

## 2015-01-14 NOTE — Telephone Encounter (Signed)
lmomtcb for Travis Bond -- need to verify pred taper instructions with pt and [facility].  See phone from earlier today.

## 2015-01-14 NOTE — Telephone Encounter (Signed)
Spoke with [facility]Travis Bond with Hospice.  Discussed below per VS.  [name] would like to verify the dosage with VS.  States it will be difficult for pt to go from 30 mg x 2 days to 10 mg daily.  Would like to know if this can be changed to prednisone 10 mg tablets take 4 x 2 days, 3 x 2 days, 2 x 2 days, then back to baseline of 10 mg qd.  Dr. Craige CottaSood, please advise.  [facility] you.  [facility]

## 2015-01-14 NOTE — Telephone Encounter (Signed)
Carols returned call  [phone]

## 2015-01-14 NOTE — Telephone Encounter (Signed)
Spoke with [name]. Aware that [facility] [address]  Nothing further needed.

## 2015-01-14 NOTE — Telephone Encounter (Signed)
[facility] ATC [name] - went directly to VM.  lmomtcb

## 2015-01-15 NOTE — Telephone Encounter (Signed)
Called and spoke to Travis Bond. Informed Mikle BosworthCarlos of the instructions. Mikle BosworthCarlos verbalized understanding and denied any further questions or concerns at this time.

## 2015-01-18 ENCOUNTER — Telehealth: Payer: Self-pay | Admitting: Pulmonary Disease

## 2015-01-18 MED ORDER — FENTANYL 25 MCG/HR TD PT72: 25.0000 ug | MEDICATED_PATCH | TRANSDERMAL | Status: DC

## 2015-01-18 NOTE — Telephone Encounter (Signed)
Spoke with Mikle BosworthCarlos  She states pt had a fall last night with no reported injuries  Pt needing refill on Duragesic patches  Will be out on Monday 01/21/15 and VS not back in the office until 01/23/15 so RA singed rx and [facility] [address]  Will forward to VS as [place]

## 2015-01-18 NOTE — Telephone Encounter (Signed)
Noted  

## 2015-01-20 ENCOUNTER — Other Ambulatory Visit: Payer: Self-pay | Admitting: Pulmonary Disease

## 2015-01-21 NOTE — Telephone Encounter (Signed)
Will sign off. Nothing further needed at this time.  

## 2015-02-04 ENCOUNTER — Telehealth: Payer: Self-pay | Admitting: Internal Medicine

## 2015-02-04 ENCOUNTER — Telehealth: Payer: Self-pay | Admitting: Pulmonary Disease

## 2015-02-04 NOTE — Telephone Encounter (Signed)
Spoke with Travis Bond.  Says he will no longer be coming to [facility].  Says he is under Hospice Care.  cdavis

## 2015-02-04 NOTE — Telephone Encounter (Signed)
LM x 1 [facility]

## 2015-02-05 ENCOUNTER — Telehealth: Payer: Self-pay | Admitting: Pulmonary Disease

## 2015-02-05 MED ORDER — ALBUTEROL SULFATE (2.5 MG/3ML) 0.083% IN NEBU: 2.5000 mg | INHALATION_SOLUTION | RESPIRATORY_TRACT | Status: DC | PRN

## 2015-02-05 NOTE — Telephone Encounter (Signed)
Called and spoke with [name], aware that verbal okay given [facility].  Nothing further needed.

## 2015-02-05 NOTE — Telephone Encounter (Signed)
Spoke with [name] at [facility].  He says that patient will run out of medication by the weekend and needs refill of his Albuterol Neb medication.  Refill sent to pharmacy. Nothing further needed.

## 2015-02-05 NOTE — Telephone Encounter (Signed)
Okay with me 

## 2015-02-05 NOTE — Telephone Encounter (Signed)
Pt is going to have to go [facility] from today through June 12th Patient caretaker had a death in the family and will not be able to care for him during this time.  Needs verbal okay for pt to go.  VS-please advise. Thanks.

## 2015-02-14 ENCOUNTER — Telehealth: Payer: Self-pay | Admitting: Pulmonary Disease

## 2015-02-14 MED ORDER — ALBUTEROL SULFATE (2.5 MG/3ML) 0.083% IN NEBU: 2.5000 mg | INHALATION_SOLUTION | RESPIRATORY_TRACT | Status: AC | PRN

## 2015-02-14 MED ORDER — PREDNISONE 10 MG PO TABS: ORAL_TABLET | ORAL | Status: DC

## 2015-02-14 NOTE — Telephone Encounter (Signed)
Spoke with Travis Bond pt is using albuterol nebs q2h-needs larger rx as he only received 1 box which will last pt approx 1 week.  Encouraging use of roxinol tid to help suppress this, but is requesting a larger rx.   [name] is also asking for a pred taper as pt is having increased weakness and fatigue.  [facility] [address]    VS please advise.  Thanks!

## 2015-02-14 NOTE — Telephone Encounter (Signed)
Offer Rx albuterol 0.083 ne solution  # 360 mls,  1 every 4-6 hours if needed, refill 11            Rx prednisone 10 mg, # 20,    4 X 2 DAYS, 3 X 2 DAYS, 2 X 2 DAYS, 1 X 2 DAYS

## 2015-02-14 NOTE — Telephone Encounter (Signed)
meds sent to preferred pharmacy.  Travis Bond with Hospice aware of recs.  Nothing further needed.

## 2015-02-14 NOTE — Telephone Encounter (Signed)
Sending to doc of day as this has not yet been addressed. Dr Maple Hudson please advise.  Thanks!  Allergies  Allergen Reactions  . Tramadol Nausea Only   Current Outpatient Prescriptions on File Prior to Visit  Medication Sig Dispense Refill  . albuterol (PROVENTIL HFA;VENTOLIN HFA) 108 (90 BASE) MCG/ACT inhaler Inhale 2 puffs into the lungs every 6 (six) hours as needed for wheezing or shortness of breath. 1 Inhaler 3  . albuterol (PROVENTIL) (2.5 MG/3ML) 0.083% nebulizer solution Take 3 mLs (2.5 mg total) by nebulization every 2 (two) hours as needed for wheezing or shortness of breath. 75 mL 6  . ALPRAZolam (XANAX) 0.25 MG tablet TAKE 1 TABLET BY MOUTH TWICE A DAY AS NEEDED FOR ANXIETY & 1 TABLET AT BEDTIME (11:30 PM) 90 tablet 0  . AMITIZA 24 MCG capsule TAKE ONE CAPSULE BY MOUTH TWICE A DAY 60 capsule 2  . amoxicillin-clavulanate (AUGMENTIN) 875-125 MG per tablet Take 1 tablet by mouth every 12 (twelve) hours. 14 tablet 0  . atorvastatin (LIPITOR) 10 MG tablet TAKE 1 TABLET BY MOUTH EVERY DAY 30 tablet 2  . budesonide-formoterol (SYMBICORT) 160-4.5 MCG/ACT inhaler Inhale 2 puffs into the lungs 2 (two) times daily. 1 Inhaler 6  . carvedilol (COREG) 3.125 MG tablet TAKE 1 TABLET BY MOUTH BY MOUTH TWICE A DAY 60 tablet 5  . digoxin (LANOXIN) 0.125 MG tablet Take 0.125 mg by mouth daily.    Marland Kitchen diltiazem (CARDIZEM) 30 MG tablet Take 1 tablet (30 mg total) by mouth every 6 (six) hours. 120 tablet 3  . fentaNYL (DURAGESIC - DOSED MCG/HR) 25 MCG/HR patch Place 1 patch (25 mcg total) onto the skin every 3 (three) days. For pain   HOSPICE PT 10 patch 0  . furosemide (LASIX) 20 MG tablet TAKE 1 TABLET BY MOUTH EVERY DAY 30 tablet 5  . gabapentin (NEURONTIN) 300 MG capsule Take 300 mg by mouth 2 (two) times daily.     Marland Kitchen guaiFENesin (MUCINEX) 600 MG 12 hr tablet Take 600 mg by mouth 2 (two) times daily.    Marland Kitchen HYDROcodone-acetaminophen (NORCO/VICODIN) 5-325 MG per tablet Take 1-2 tablets by mouth every 4  (four) hours as needed for moderate pain. 360 tablet 0  . ipratropium (ATROVENT) 0.02 % nebulizer solution INHALE 1 VIAL VIA NEBULIZERS 4 TIMES A DAY 250 mL 6  . isosorbide mononitrate (IMDUR) 60 MG 24 hr tablet TAKE 1 TABLET BY MOUTH EVERY DAY 90 tablet 1  . levothyroxine (SYNTHROID, LEVOTHROID) 88 MCG tablet TAKE 1 TABLET BY MOUTH EVERY DAY 30 tablet 5  . nitroGLYCERIN (NITROSTAT) 0.4 MG SL tablet Place 1 tablet (0.4 mg total) under the tongue every 5 (five) minutes as needed for chest pain. 25 tablet 3  . predniSONE (DELTASONE) 10 MG tablet TAKE 1 TABLET BY MOUTH EVERY DAY 30 tablet 1  . predniSONE (DELTASONE) 10 MG tablet 4 pills daily for 2 days, 3 pills daily for 2 days, 2 pills daily for 2 days, then 1 pill daily 18 tablet 0  . VENTOLIN HFA 108 (90 BASE) MCG/ACT inhaler INHALE 2 PUFFS BY MOUTH EVERY 6 HOURS AS NEEDED 18 Inhaler 0  . warfarin (COUMADIN) 3 MG tablet 1 by mouth daily EXCEPT 1 1/2 on Tuesday     No current facility-administered medications on file prior to visit.

## 2015-02-19 ENCOUNTER — Other Ambulatory Visit: Payer: Self-pay | Admitting: Internal Medicine

## 2015-02-19 ENCOUNTER — Other Ambulatory Visit: Payer: Self-pay | Admitting: Pulmonary Disease

## 2015-02-19 ENCOUNTER — Telehealth: Payer: Self-pay | Admitting: Pulmonary Disease

## 2015-02-19 MED ORDER — FENTANYL 25 MCG/HR TD PT72: 25.0000 ug | MEDICATED_PATCH | TRANSDERMAL | Status: DC

## 2015-02-19 NOTE — Telephone Encounter (Signed)
[name] returned call - [phone]

## 2015-02-19 NOTE — Telephone Encounter (Signed)
lmomtcb x1 for [employer] (hospice nurse)

## 2015-02-19 NOTE — Telephone Encounter (Signed)
Called spoke with [name]. She reports pt needs refill on fentanyl patches. Last refilled 01/18/15 #10 patches Place 1 patch (25 mcg total) onto the skin every 3 (three) days. For pain  Pt has refills on his albuterol neb. TP has signed RX.  [facility] and [name] is aware. Nothing further needed

## 2015-02-20 ENCOUNTER — Other Ambulatory Visit: Payer: Self-pay | Admitting: Internal Medicine

## 2015-02-22 ENCOUNTER — Other Ambulatory Visit: Payer: Self-pay | Admitting: Internal Medicine

## 2015-02-22 NOTE — Telephone Encounter (Signed)
Noted  

## 2015-03-01 ENCOUNTER — Other Ambulatory Visit: Payer: Self-pay | Admitting: Pulmonary Disease

## 2015-03-01 ENCOUNTER — Other Ambulatory Visit: Payer: Self-pay

## 2015-03-01 ENCOUNTER — Telehealth: Payer: Self-pay | Admitting: Pulmonary Disease

## 2015-03-01 NOTE — Telephone Encounter (Signed)
Pt requesting refill on alprazolam 0.25mg  tab. Last disp: #90 1 po bid prn anxiety and 1 qhs 0 refills on 11/30/14. Last ov: 09/17/14 Next ov: none   Dr. Craige CottaSood please advise if ok to refill.  Thanks!

## 2015-03-01 NOTE — Telephone Encounter (Signed)
Travis BosworthCarlos called requesting a VO from Dr. Craige CottaSood giving the ok for pt to reside at [facility] from 7/2-7/5 as the family members that typically care for him are going on vacation. Hospice RN's and social work will visit pt over the weekend.

## 2015-03-01 NOTE — Telephone Encounter (Signed)
VS - please advise if you okay with giving a verbal for this. Thanks.

## 2015-03-01 NOTE — Telephone Encounter (Signed)
Attempted to call [facility]. No answer, no option to leave a message. Will try back.

## 2015-03-01 NOTE — Telephone Encounter (Signed)
Okay to give verbal order.

## 2015-03-05 NOTE — Telephone Encounter (Signed)
Spoke with Travis Bond. Gave verbal order. Nothing further was needed.

## 2015-03-06 ENCOUNTER — Telehealth: Payer: Self-pay | Admitting: Pulmonary Disease

## 2015-03-06 NOTE — Telephone Encounter (Signed)
Spoke with Travis Bond. She is aware of VS's recommendation. Nothing further was needed at this time.

## 2015-03-06 NOTE — Telephone Encounter (Signed)
lmtcb x1 for [name]. 

## 2015-03-06 NOTE — Telephone Encounter (Signed)
Travis Bond has lost a lot of weight in his upper torso, but his lower extremity edema is worsening and has gained a lot of water weight from lower extremities.  [name] from Hospice wants to know if they need to increase patient's lasix to remove some of the swelling and water weight from his lower extremities. Patient currently taking Lasix 20mg  daily  VS - please advise.

## 2015-03-06 NOTE — Telephone Encounter (Signed)
Please have him take lasix 40 mg daily for next two days.  If swelling better, then can decrease lasix back to 20 mg daily.

## 2015-03-07 ENCOUNTER — Telehealth: Payer: Self-pay | Admitting: Pulmonary Disease

## 2015-03-07 MED ORDER — PREDNISONE 10 MG PO TABS: ORAL_TABLET | ORAL | Status: DC

## 2015-03-07 NOTE — Telephone Encounter (Signed)
Spoke with [name] at [facility], states that pt is having increasing sob-requesting another pred taper. Pt just finished one on 6/23. [facility] [address]    Dr. Craige CottaSood are you ok with this refill?  Thanks!

## 2015-03-07 NOTE — Telephone Encounter (Signed)
[name]

## 2015-03-07 NOTE — Telephone Encounter (Signed)
Okay to send order for prednisone 10 mg pills >> 4 pills daily for 2 days, 3 pills daily for 2 days, 2 pills daily for 2 days, 1 pill daily for 2 days.

## 2015-03-11 NOTE — Telephone Encounter (Signed)
Called Travis Bond and LMTCB x2

## 2015-03-12 NOTE — Telephone Encounter (Signed)
I called spoke with [name]. She reports pt did pick this up. Nothing further needed

## 2015-03-19 ENCOUNTER — Other Ambulatory Visit: Payer: Self-pay | Admitting: Internal Medicine

## 2015-03-27 ENCOUNTER — Telehealth: Payer: Self-pay | Admitting: Pulmonary Disease

## 2015-03-27 MED ORDER — PREDNISONE 10 MG PO TABS: ORAL_TABLET | ORAL | Status: DC

## 2015-03-27 NOTE — Telephone Encounter (Signed)
Noted  

## 2015-03-27 NOTE — Telephone Encounter (Signed)
Spoke with Travis Bond aware that okay to leave catheter in. Verbal order given. [name] states that the patient is requesting Pred taper d/t increased SOB- states that it is okay with her to call back in AM about Pred.  Dr Craige Cotta is 11pm e-link tonight and will not be available tomorrow. ---- Spoke with Dr Vassie Loll, okay to give 8-day Pred taper.  [name] is aware of this and that [facility] [address]  Nothing further needed.  Will send to Dr Craige Cotta as Lorain Childes of treatment.

## 2015-03-27 NOTE — Telephone Encounter (Signed)
ok 

## 2015-03-27 NOTE — Telephone Encounter (Signed)
Spoke with hospice nurse and she seen pt today due to pt having increased abdominal pain and bloating.  PT was disimpacted and straight with yield of .  Pt states he felt much better after this.  Hospice nurse needs verbal order to leave catheter in place.  Please advise if ok.  Send to doc of day.

## 2015-03-28 ENCOUNTER — Telehealth: Payer: Self-pay | Admitting: Pulmonary Disease

## 2015-03-28 MED ORDER — PHENAZOPYRIDINE HCL 200 MG PO TABS: 200.0000 mg | ORAL_TABLET | Freq: Three times a day (TID) | ORAL | Status: AC

## 2015-03-28 NOTE — Telephone Encounter (Signed)
Who is managing this- does he have a Urologist? We can offer pyridium 200 mg, # 6, 1 three times daily after meals x 2 days

## 2015-03-28 NOTE — Telephone Encounter (Signed)
Patient does not have Urologist, this has never been a problem before Rx for Pyridium sent to pharmacy. [name] from [facility] notified.  FYI - Dr. Craige Cotta

## 2015-03-28 NOTE — Telephone Encounter (Signed)
Patient had catheter on yesterday.  Patient complaining of burning.  Thinks he is having bladder spasms.  Catheter draining good, no blood in urine, no cloudy urine.  [name] from Hospice wants to know if patient would benefit from Pyridium?  If so, needs Rx called in.    To Dr. Maple Hudson in Dr. Evlyn Courier absence.  Allergies  Allergen Reactions  . Tramadol Nausea Only   Current Outpatient Prescriptions on File Prior to Visit  Medication Sig Dispense Refill  . albuterol (PROVENTIL HFA;VENTOLIN HFA) 108 (90 BASE) MCG/ACT inhaler Inhale 2 puffs into the lungs every 6 (six) hours as needed for wheezing or shortness of breath. 1 Inhaler 3  . albuterol (PROVENTIL) (2.5 MG/3ML) 0.083% nebulizer solution Take 3 mLs (2.5 mg total) by nebulization every 2 (two) hours as needed for wheezing or shortness of breath. 360 mL 11  . ALPRAZolam (XANAX) 0.25 MG tablet TAKE 1 TABLET BY MOUTH TWICE A DAY AS NEEDED AND TAKE 1 TABLET BY MOUTH AT BEDTIME 90 tablet 0  . AMITIZA 24 MCG capsule TAKE ONE CAPSULE BY MOUTH TWICE A DAY 60 capsule 5  . amoxicillin-clavulanate (AUGMENTIN) 875-125 MG per tablet Take 1 tablet by mouth every 12 (twelve) hours. 14 tablet 0  . atorvastatin (LIPITOR) 10 MG tablet TAKE 1 TABLET BY MOUTH EVERY DAY 30 tablet 2  . budesonide-formoterol (SYMBICORT) 160-4.5 MCG/ACT inhaler Inhale 2 puffs into the lungs 2 (two) times daily. 1 Inhaler 6  . carvedilol (COREG) 3.125 MG tablet TAKE 1 TABLET BY MOUTH BY MOUTH TWICE A DAY 60 tablet 5  . digoxin (LANOXIN) 0.125 MG tablet Take 0.125 mg by mouth daily.    Marland Kitchen diltiazem (CARDIZEM) 30 MG tablet Take 1 tablet (30 mg total) by mouth every 6 (six) hours. 120 tablet 3  . fentaNYL (DURAGESIC - DOSED MCG/HR) 25 MCG/HR patch Place 1 patch (25 mcg total) onto the skin every 3 (three) days. For pain   HOSPICE PT 10 patch 0  . furosemide (LASIX) 20 MG tablet TAKE 1 TABLET BY MOUTH EVERY DAY 30 tablet 3  . gabapentin (NEURONTIN) 300 MG capsule Take 300 mg by mouth 2  (two) times daily.     Marland Kitchen guaiFENesin (MUCINEX) 600 MG 12 hr tablet Take 600 mg by mouth 2 (two) times daily.    Marland Kitchen HYDROcodone-acetaminophen (NORCO/VICODIN) 5-325 MG per tablet Take 1-2 tablets by mouth every 4 (four) hours as needed for moderate pain. 360 tablet 0  . ipratropium (ATROVENT) 0.02 % nebulizer solution INHALE 1 VIAL VIA NEBULIZERS 4 TIMES A DAY 250 mL 6  . isosorbide mononitrate (IMDUR) 60 MG 24 hr tablet TAKE 1 TABLET BY MOUTH EVERY DAY 90 tablet 1  . levothyroxine (SYNTHROID, LEVOTHROID) 88 MCG tablet TAKE 1 TABLET BY MOUTH EVERY DAY 30 tablet 5  . nitroGLYCERIN (NITROSTAT) 0.4 MG SL tablet Place 1 tablet (0.4 mg total) under the tongue every 5 (five) minutes as needed for chest pain. 25 tablet 3  . predniSONE (DELTASONE) 10 MG tablet 4 pills daily for 2 days, 3 pills daily for 2 days, 2 pills daily for 2 days, then 1 pill daily 18 tablet 0  . predniSONE (DELTASONE) 10 MG tablet  X2 days,  X2 days,  X2 days,  X2 days, then stop. 20 tablet 0  . predniSONE (DELTASONE) 10 MG tablet TAKE 1 TABLET BY MOUTH EVERY DAY 30 tablet 5  . predniSONE (DELTASONE) 10 MG tablet 4pills daily for 2 days, 3 pills daily for 2 days, 2 pills  daily for 2 days, 1 pill daily for 2 days. 20 tablet 0  . predniSONE (DELTASONE) 10 MG tablet Take 4 tabs po x 2 days, then 3 x 2 days, then 2 x 2 days, then 1 x 2 days then stop. 20 tablet 0  . VENTOLIN HFA 108 (90 BASE) MCG/ACT inhaler INHALE 2 PUFFS BY MOUTH EVERY 6 HOURS AS NEEDED 1 Inhaler 0  . warfarin (COUMADIN) 3 MG tablet 1 by mouth daily EXCEPT 1 1/2 on Tuesday     No current facility-administered medications on file prior to visit.

## 2015-03-28 NOTE — Telephone Encounter (Signed)
Noted  

## 2015-04-06 ENCOUNTER — Telehealth: Payer: Self-pay | Admitting: Pulmonary Disease

## 2015-04-06 NOTE — Telephone Encounter (Signed)
Hospice called to transition [facility] to [facility] at family's request due to level of care requirement. I gave verbal consent for the transfer once a bed is available.

## 2015-04-09 ENCOUNTER — Telehealth: Payer: Self-pay | Admitting: Pulmonary Disease

## 2015-04-09 NOTE — Telephone Encounter (Signed)
[name] said that family told him that patient's feet were blue today.  He said that when he went by to see him that his feet looked fine.  He does have pain in his legs.  [name] wanted Dr. Craige Cotta to know that he is going to check his INR again on Friday.  He will call back if any new updates.  FYI - Dr. Craige Cotta

## 2015-04-10 NOTE — Telephone Encounter (Signed)
Noted  

## 2015-04-16 ENCOUNTER — Telehealth: Payer: Self-pay | Admitting: Pulmonary Disease

## 2015-04-16 NOTE — Telephone Encounter (Signed)
Noted  

## 2015-04-16 NOTE — Telephone Encounter (Signed)
Hospice called to inform you that the pt fell out of bed this but no injuries. States pt is very weak. States she has called since pt fell and he is still doing fine. Just a FYI.

## 2015-04-17 NOTE — Telephone Encounter (Signed)
Nothing further needed at this time. Will sign off.  

## 2015-04-19 ENCOUNTER — Other Ambulatory Visit: Payer: Self-pay | Admitting: Internal Medicine

## 2015-04-19 ENCOUNTER — Telehealth: Payer: Self-pay | Admitting: Pulmonary Disease

## 2015-04-19 NOTE — Telephone Encounter (Signed)
Will review and inform Travis Bond when I am back in office.

## 2015-04-19 NOTE — Telephone Encounter (Signed)
Travis Bond from Hospice is sending [place] medication list and requesting that some of his medications get taken off.  Travis Bond says that his blood pressures are  96/56. Travis Bond would like to stop as many medications as he can and would like Dr. Craige Cotta to review the list and confirm what medications would be okay for patient to stop taking.  List being faxed. To [name] to follow up

## 2015-04-22 ENCOUNTER — Telehealth: Payer: Self-pay | Admitting: Pulmonary Disease

## 2015-04-22 NOTE — Telephone Encounter (Signed)
Okay with me 

## 2015-04-22 NOTE — Telephone Encounter (Signed)
Spoke with [facility] States that she needs a VO for the patient to go to [facility] x 5 days.  Family is requesting this so that they can rest for a few days.  Travis Bond with [facility] staff will be caring for the patient during his 5 day stay.  Pt to be checked in Aug 25th - 30th  Please advise Dr Craige Cotta if okay to give verbal. Thanks.

## 2015-04-22 NOTE — Telephone Encounter (Signed)
Called spoke with Travis Bond-hospice nurse and gave VO. Nothing further needed

## 2015-04-23 ENCOUNTER — Telehealth: Payer: Self-pay | Admitting: Pulmonary Disease

## 2015-04-23 NOTE — Telephone Encounter (Signed)
See TE 04/19/2015 TE Closed

## 2015-05-01 ENCOUNTER — Telehealth: Payer: Self-pay | Admitting: Pulmonary Disease

## 2015-05-01 ENCOUNTER — Other Ambulatory Visit: Payer: Self-pay | Admitting: Internal Medicine

## 2015-05-01 NOTE — Telephone Encounter (Signed)
Noted  

## 2015-05-01 NOTE — Telephone Encounter (Signed)
Travis Bond, cb, informed her that Mindy stated she faxed over med list about prior, Travis Bond states she will check to make sure they received the med list and call us back to let us know.

## 2015-05-01 NOTE — Telephone Encounter (Signed)
I faxed this over to hospice about 30 min ago.  Called Travis Bond and LMTCB x1

## 2015-05-01 NOTE — Telephone Encounter (Signed)
Form has been faxed back to hospice on pt.

## 2015-05-08 ENCOUNTER — Telehealth: Payer: Self-pay | Admitting: Pulmonary Disease

## 2015-05-08 NOTE — Telephone Encounter (Signed)
Left message for hospice to call back

## 2015-05-09 ENCOUNTER — Telehealth: Payer: Self-pay | Admitting: Pulmonary Disease

## 2015-05-09 MED ORDER — FENTANYL 25 MCG/HR TD PT72: 25.0000 ug | MEDICATED_PATCH | TRANSDERMAL | Status: DC

## 2015-05-09 NOTE — Telephone Encounter (Signed)
I do not have list anymore.

## 2015-05-09 NOTE — Telephone Encounter (Signed)
Called spoke with [name]. Pt needs refill on fentaNYL (DURAGESIC - DOSED MCG/HR) 25 MCG/HR patch 02/19/15 #10 patches  Place 1 patch (25 mcg total) onto the skin every 3 (three) days. For pain  HOSPICE PT  Pt has 1 patch left and is fine to wait for VS to return tomorrow to office. Also made [facility] aware we faxed over the med reconciliation paper on 8/31. She will check with medical records.  Please advise on refill Dr. Craige Cotta thanks

## 2015-05-09 NOTE — Telephone Encounter (Signed)
Called and spoke Tracy. She is going to re-fax med list to the front fax for Dr. Craige Cotta to review the pt's med list and eliminate the medications that are unnecessary. This was faxed back to Hospice on 8.31.16 but Hospice never received it.   Will hold in triage till fax is received.

## 2015-05-09 NOTE — Telephone Encounter (Signed)
Printed Rx for Craige Cotta to sign when he gets in the office tomorrow. Placed in Dr. Evlyn Courier box  (Unable to give 3 refills due to control substance)  Will send to Crystal (who will be working with VS tomorrow) for follow up Left message advising [name] that we will fax RX tomorrow when Dr. Craige Cotta is here

## 2015-05-09 NOTE — Telephone Encounter (Signed)
Called and spoke to Jane Lew at [facility].   Med list is not scanned into patient's chart, will have to locate list to re-fax it to them.  Dr. Craige Cotta, do you still have this med list?

## 2015-05-09 NOTE — Telephone Encounter (Signed)
Okay to send refill for duragesic 25 mcg topical every 72hrs.  Dispense one month supply with 3 refills.  Please include that pt is hospice care on prescription.

## 2015-05-10 NOTE — Telephone Encounter (Signed)
Reviewed med list.  He can stop taking cardizem, digoxin, imdur, lipitor, and symbicort.

## 2015-05-10 NOTE — Telephone Encounter (Signed)
Faxed med list also completed with below information.  I faxed this to [facility] with Hospice at below fax number.  [name] aware and will call back if they do not receive fax.  List placed in VS's scan folder.

## 2015-05-10 NOTE — Telephone Encounter (Signed)
Med list received and given to Dr Craige Cotta for review.  Will close this message.  Refer to 05/08/15 message

## 2015-05-10 NOTE — Telephone Encounter (Signed)
Fentanyl patch rx signed by Dr. Craige Cotta.  [phone] with "[facility] pt" written on it.  Travis Bond aware.  [name] states they faxed over a med list for VS to review to see which meds pt should cont and if any can be d/c'd.  Fax was received and given to VS to review.  It will need to be faxed to [phone] once completed.

## 2015-05-15 ENCOUNTER — Other Ambulatory Visit: Payer: Self-pay | Admitting: Internal Medicine

## 2015-05-18 ENCOUNTER — Other Ambulatory Visit: Payer: Self-pay | Admitting: Internal Medicine

## 2015-05-21 IMAGING — CR DG CHEST 2V
2 series · 2 of 2 positions shown · non-contrast
Comparison: 06/08/2014

CLINICAL DATA: Productive cough. Recent pneumonia. Atrial
fibrillation. Coronary artery disease. COPD.

EXAM:
CHEST  2 VIEW

[view not recorded (1 of 2)]
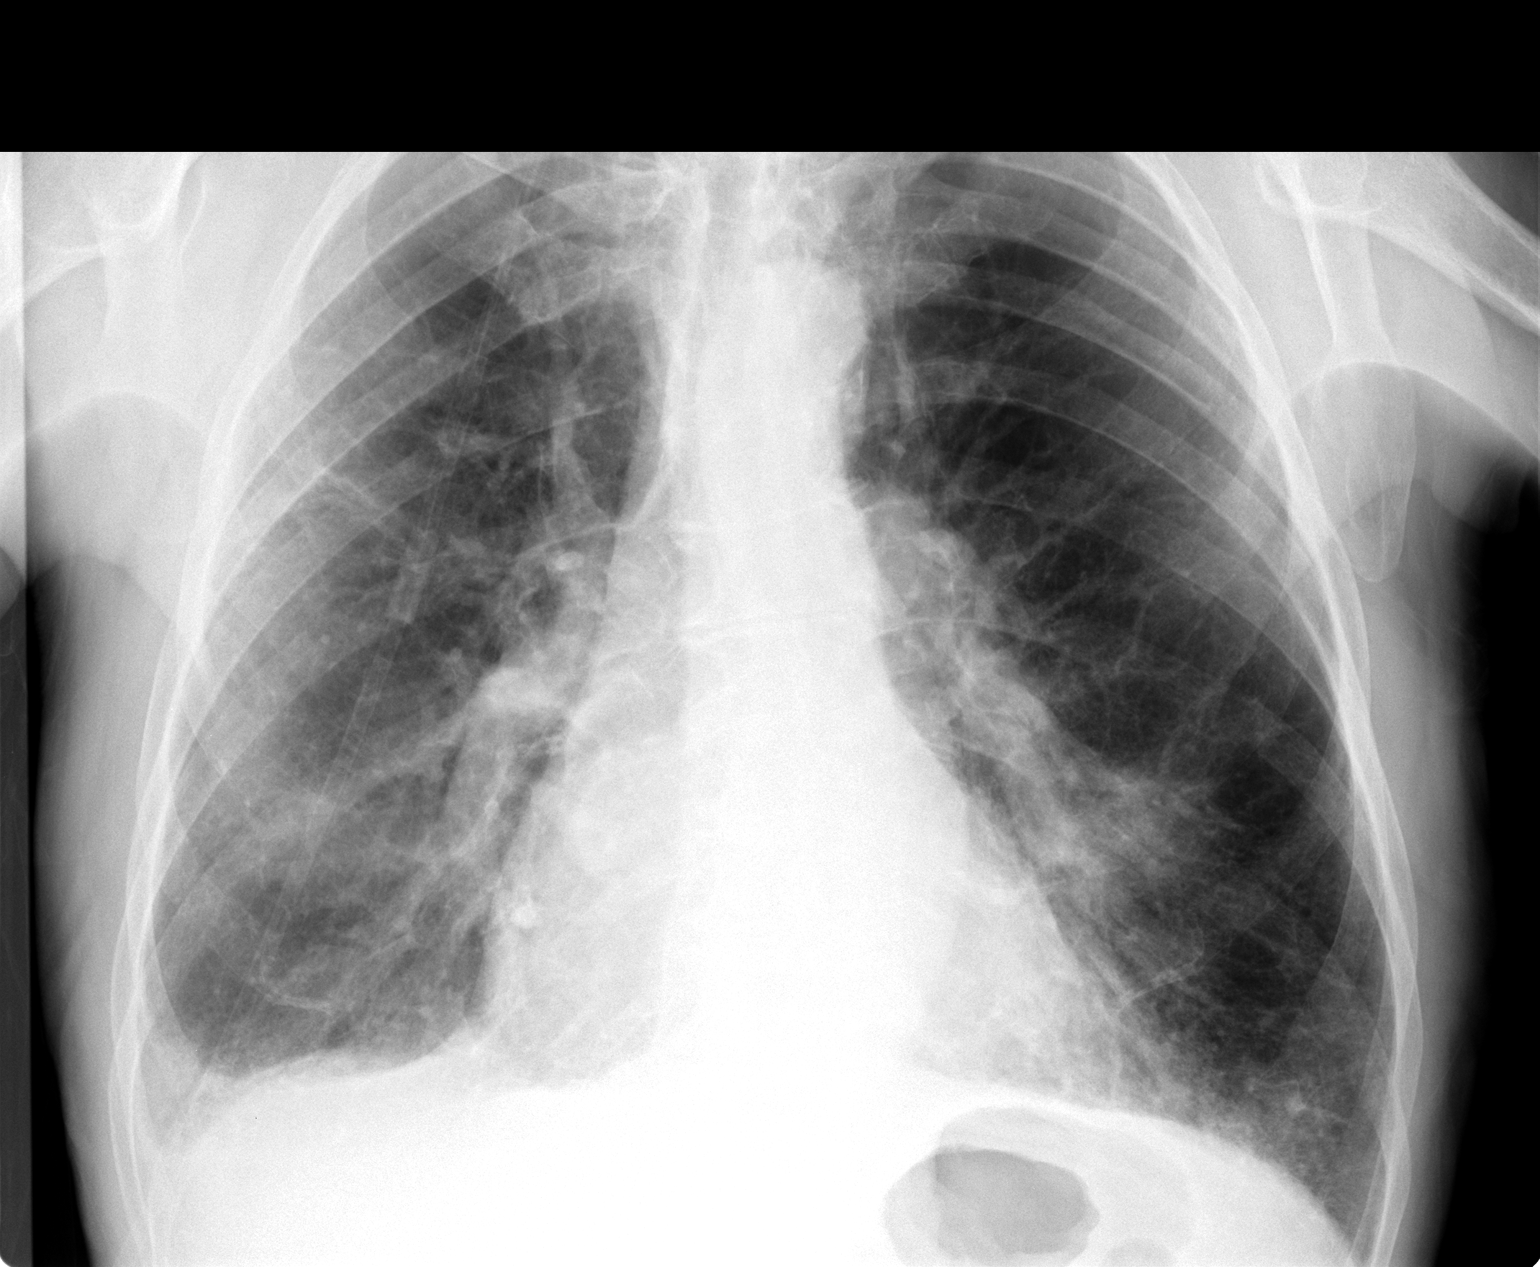

[view not recorded (2 of 2)]
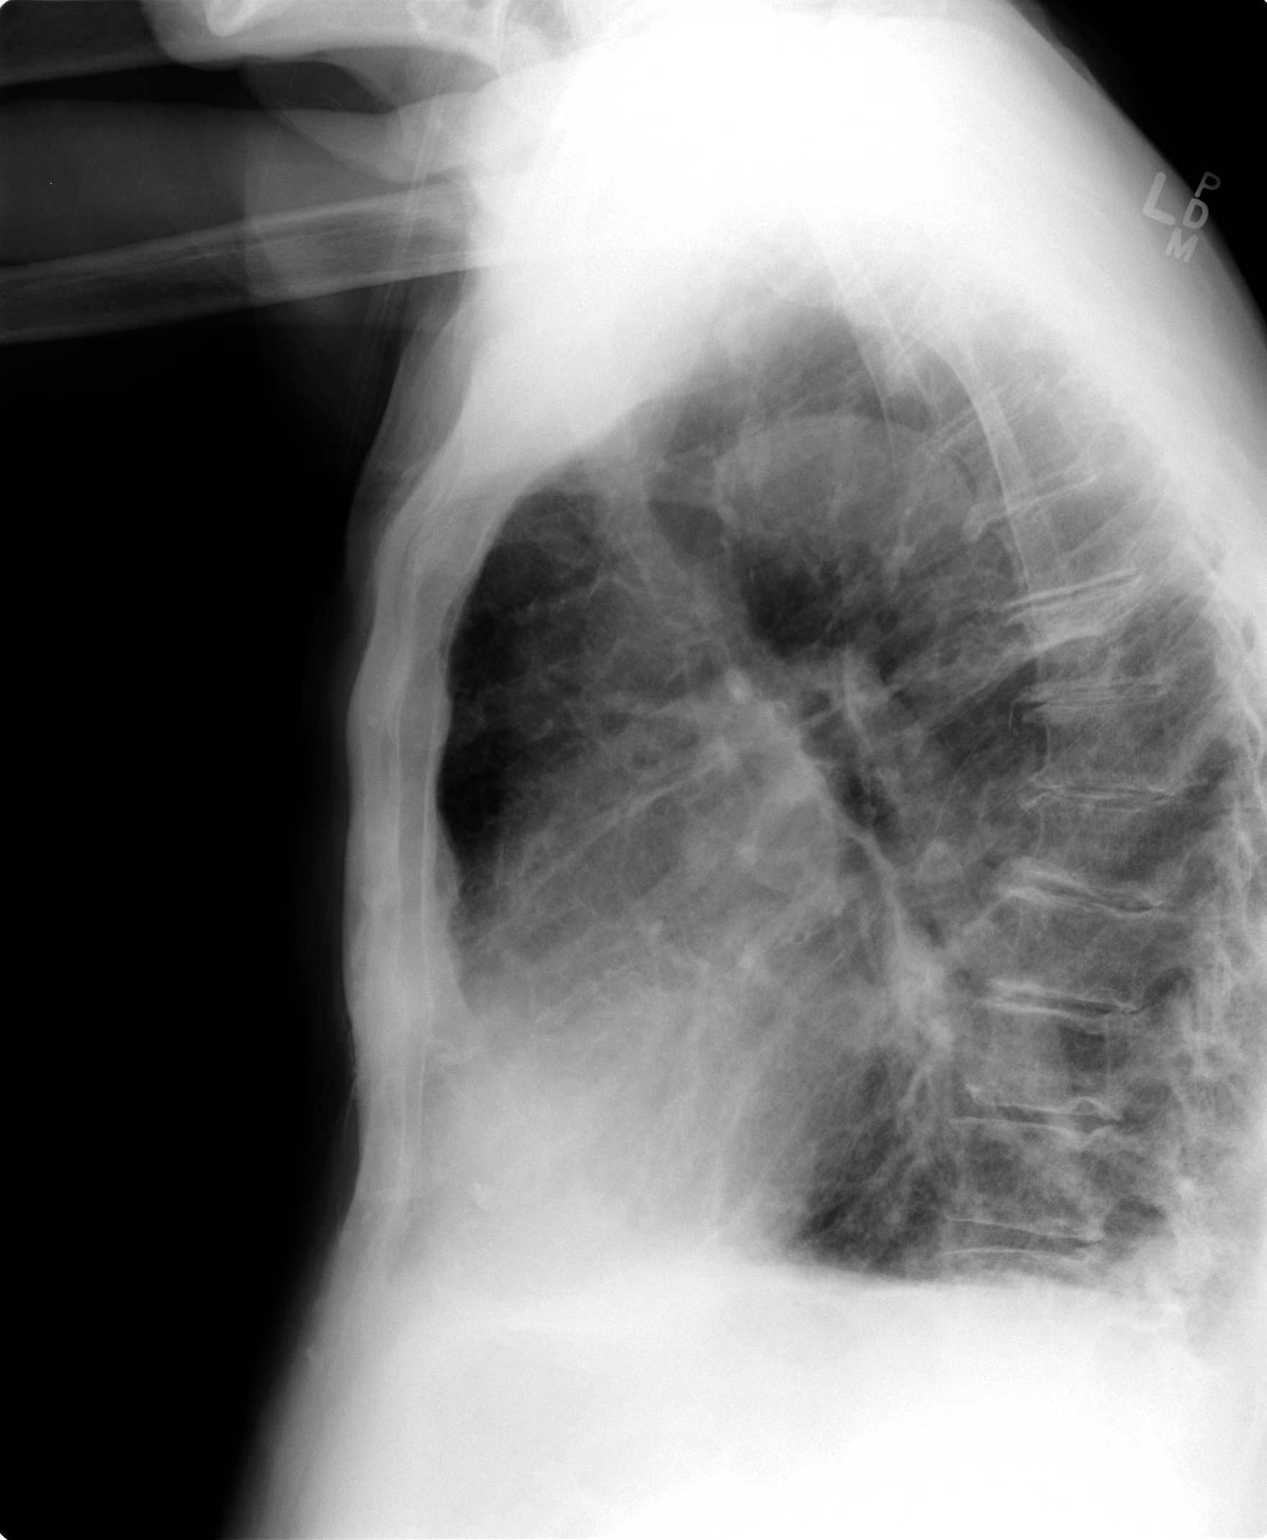

[2 of 2 positions shown; findings below may reference images not displayed]

FINDINGS: Pulmonary emphysema noted. There has been near complete resolution
of bilateral pulmonary airspace disease since previous study.
Persistent tiny right pleural effusion versus pleural thickening
noted. Mild cardiomegaly is stable.
IMPRESSION: Near complete resolution of bilateral pulmonary airspace disease
since prior study.

Emphysema.

## 2015-05-22 ENCOUNTER — Telehealth: Payer: Self-pay | Admitting: Pulmonary Disease

## 2015-05-22 NOTE — Telephone Encounter (Signed)
Will forward to VS as FYI since nurse stated no need for a call back today

## 2015-05-22 NOTE — Telephone Encounter (Signed)
Noted  

## 2015-05-24 ENCOUNTER — Telehealth: Payer: Self-pay | Admitting: Pulmonary Disease

## 2015-05-24 MED ORDER — PREDNISONE 10 MG PO TABS: ORAL_TABLET | ORAL | Status: DC

## 2015-05-24 NOTE — Telephone Encounter (Signed)
Spoke w/ [name]. Aware of recs below. Pt already takes 10 mg daily but still need to send in script for short pred burst. I have done so. Nothing further needed

## 2015-05-24 NOTE — Telephone Encounter (Signed)
Called spoke with Travis Bond. Reports pt is wheezing, very SOB today, lethargic, lungs sound "tight". She wants prednisone taper called in for pt. Please advise Dr. Craige Cotta thanks

## 2015-05-24 NOTE — Telephone Encounter (Signed)
Send script for prednisone 10 mg pill >> 4 pills daily for 3 days, 3 pills daily for 3 days, 2 pills daily for 3 days, 1 pill daily for 3 days.

## 2015-05-27 ENCOUNTER — Telehealth: Payer: Self-pay | Admitting: Pulmonary Disease

## 2015-05-27 NOTE — Telephone Encounter (Signed)
Spoke with [name]. States that pt had a fall yesterday. He is not having any issues. She just wanted [place] to be aware of this.

## 2015-05-28 ENCOUNTER — Telehealth: Payer: Self-pay | Admitting: Pulmonary Disease

## 2015-05-28 NOTE — Telephone Encounter (Signed)
Okay to do whatever Travis Bond wants.

## 2015-05-28 NOTE — Telephone Encounter (Signed)
Called spoke with [name] w/ hospice. Needs VO for 5 night respite to start mount to give the family a break. Hospice will still go see him daily.  Please advise Dr. Craige Cotta thanks

## 2015-05-28 NOTE — Telephone Encounter (Signed)
Travis Bond is aware. Nothing further needed

## 2015-06-14 ENCOUNTER — Telehealth: Payer: Self-pay | Admitting: Pulmonary Disease

## 2015-06-14 ENCOUNTER — Other Ambulatory Visit: Payer: Self-pay | Admitting: Internal Medicine

## 2015-06-14 MED ORDER — AMOXICILLIN-POT CLAVULANATE 875-125 MG PO TABS: 1.0000 | ORAL_TABLET | Freq: Two times a day (BID) | ORAL | Status: DC

## 2015-06-14 NOTE — Telephone Encounter (Signed)
Can send script for augmentin 875 mg bid, #14 with no refills.

## 2015-06-14 NOTE — Telephone Encounter (Signed)
Does pt stop Bactrim and take Augmentin or does he need to take both?   VS - please advise.

## 2015-06-14 NOTE — Telephone Encounter (Signed)
Spoke with Travis Bond. States that the pt has been declining this week. Reports that he has been very weak and not eating much. Has current UTI and was treated by the Hospice MD. Last night he developed a cough and chest congestion. Cough is producing green mucus. Travis Bond was hoping that VS would send something in for the URI.  VS - please advise. Thanks.

## 2015-06-14 NOTE — Telephone Encounter (Signed)
Can stop bactrim

## 2015-06-14 NOTE — Telephone Encounter (Signed)
Mardella LaymanLindsey notified that pt can stop Bactrim and take Augmentin. RX sent to pharmacy. Nothing further needed.

## 2015-06-17 ENCOUNTER — Telehealth: Payer: Self-pay | Admitting: Pulmonary Disease

## 2015-06-17 NOTE — Telephone Encounter (Signed)
Spoke with Alexa (hospice nurse). She reports pt is declining, having hard time taking his medication d/t not being able to swallow. She has a call into Dr. York Spanielilley's office to see what cardiac meds can be stopped as well. Also asking what meds can be stopped from Dr. Craige CottaSood.  She wants a call back today. Please advise Dr. Craige CottaSood thanks

## 2015-06-17 NOTE — Telephone Encounter (Signed)
Called and spoke to LamontWendy, with Hospice. Toniann FailWendy stated the pt is having difficulty swallowing and is requesting what medication can the pt be safely taken off of. I asked about the pt's status, Toniann FailWendy is not the pt's nurse. Toniann FailWendy stated she would have the pt's nurse call back. Will await call.

## 2015-06-17 NOTE — Telephone Encounter (Signed)
Can stop all meds if he is not able to swallow pills.

## 2015-06-17 NOTE — Telephone Encounter (Signed)
Pt nurse returning call and can be reached @ [phone].Caren GriffinsStanley A Dalton

## 2015-06-17 NOTE — Telephone Encounter (Signed)
Called spoke with Alexa, made aware of below. Nothing further needed

## 2015-06-18 ENCOUNTER — Telehealth: Payer: Self-pay | Admitting: Pulmonary Disease

## 2015-06-18 NOTE — Telephone Encounter (Signed)
[phone], Travis Bond

## 2015-06-18 NOTE — Telephone Encounter (Signed)
Urine culture from 10/12 shows Entrococcus and P mirabilis. Patient was on bactrim which was then changed to augmentin for 7 days on 06/14/15. Both organisms are sensitive to augmentin.   No need to take any other action.

## 2015-06-18 NOTE — Telephone Encounter (Signed)
Culture results received. Given to JJ to have Dr. Isaiah SergeMannam address since VS is not in the office. Please advise thanks

## 2015-06-18 NOTE — Telephone Encounter (Signed)
Called spoke with BediasLindsay. They are going to fax over pt urine cultures and wants to know if anything needs to be done. Will await fax

## 2015-06-18 NOTE — Telephone Encounter (Signed)
Called made [place] aware of below. She verbalized understanding and needed nothing further

## 2015-06-27 ENCOUNTER — Telehealth: Payer: Self-pay | Admitting: Pulmonary Disease

## 2015-06-27 NOTE — Telephone Encounter (Signed)
Opened msg in error. °

## 2015-06-27 NOTE — Telephone Encounter (Signed)
[facility] States that no one from their pharmacy called us. They have no record of it. lmtcb x1 for pt.

## 2015-06-28 NOTE — Telephone Encounter (Signed)
Pt's daughter, [name], returned call. [name] stated she informed Hospice nurse about pt getting low on Fentanyl patches. Renee stated that [facility], this was last filled by Dr. Craige CottaSood on 9.8.16. Advised Renee to contact Hospice to see if they are filling this medication before we ask Dr. Craige CottaSood. Will await Renee's call back.

## 2015-06-28 NOTE — Telephone Encounter (Signed)
Opened msg in error.

## 2015-06-28 NOTE — Telephone Encounter (Signed)
Called pt and home # states call can't go through as dialed and I tried several times, received same message Called [name] [facility]

## 2015-07-01 ENCOUNTER — Telehealth: Payer: Self-pay | Admitting: Pulmonary Disease

## 2015-07-01 MED ORDER — CIPROFLOXACIN HCL 500 MG PO TABS: 500.0000 mg | ORAL_TABLET | Freq: Two times a day (BID) | ORAL | Status: DC

## 2015-07-01 NOTE — Telephone Encounter (Signed)
Yes. Sorry. Cipro 500mg  po bid x3 days. No refills.

## 2015-07-01 NOTE — Telephone Encounter (Signed)
[facility].  She will do UA and CX and send us results.  She has been advised that patient is to start Cipro 500mg  BID x 3 days and that Rx has been sent to pharmacy. Nothing further needed. Closing encounter

## 2015-07-01 NOTE — Telephone Encounter (Signed)
Starting Cipro 400mg  po bid x3 days. Checking U/A & Urine Culture. Defer to Dr. Craige CottaSood on continuing antibiotics pending results of culture.

## 2015-07-01 NOTE — Telephone Encounter (Signed)
lmtcb x1 for Renee.  

## 2015-07-01 NOTE — Telephone Encounter (Signed)
[name]  Patient's urine is cloudy, smells bad, afraid of infection. Patient has been lethargic, not eating. Needs an order to do UA and CNF. Wants to know if he wants to treat with antibiotic now or wait for results to come back.   To Dr. Jamison NeighborNestor in Dr. Evlyn CourierSood's absence

## 2015-07-01 NOTE — Telephone Encounter (Signed)
I cannot find Cipro 400mg , did you mean 500mg ?

## 2015-07-04 ENCOUNTER — Telehealth: Payer: Self-pay | Admitting: Pulmonary Disease

## 2015-07-04 MED ORDER — SULFAMETHOXAZOLE-TRIMETHOPRIM 800-160 MG PO TABS: 1.0000 | ORAL_TABLET | Freq: Two times a day (BID) | ORAL | Status: DC

## 2015-07-04 NOTE — Telephone Encounter (Signed)
Called Travis Bond. She is going to refax the final culture over. Will await fax

## 2015-07-04 NOTE — Telephone Encounter (Signed)
I called spoke with Travis Bond. Needing this addressed today. . On 10/31 pt was rx'd cipro 500 BID x3 The UA results we have is from 11/1 and states still pending. Called Travis Bond to see if she has the final report to fax over? LMTCB for The M[employer] CompanyLindsay

## 2015-07-04 NOTE — Telephone Encounter (Signed)
Spoke with Travis Bond and notified of recs per CDY  Rx was sent to pharm  Nothing further needed

## 2015-07-04 NOTE — Telephone Encounter (Signed)
Lillia AbedLindsay returned call, may be reached at [phone]

## 2015-07-04 NOTE — Telephone Encounter (Signed)
We don't have drug sensitivities yet on the urine culture from 10/31, but I would expect his Proteus to be sensitive to the Cipro which was given.  If he is still having trouble, offer bactrim DS, # 14, 1 twice daily

## 2015-07-04 NOTE — Telephone Encounter (Signed)
Results received and given to Robynn Panelise to attach to message. Please advise Dr. Maple HudsonYoung in VS absence  Please advise thanks

## 2015-07-09 ENCOUNTER — Other Ambulatory Visit: Payer: Self-pay | Admitting: Internal Medicine

## 2015-07-11 ENCOUNTER — Encounter: Payer: Self-pay | Admitting: Pulmonary Disease

## 2015-07-12 ENCOUNTER — Telehealth: Payer: Self-pay | Admitting: Physician Assistant

## 2015-07-12 NOTE — Telephone Encounter (Signed)
Paged by answering service that [facility] Dr. Donnie Bond. [facility] is covering for him today.   Family, hospice and Dr. Donnie Bond has decided to stop Coumadin 2 weeks ago due to ongoing infection and need of abx. Instead of stopping coumadin, Travis AbedLindsay has found out today that daughter is giving 1mg  of coumadin to the patient every day. Requesting INR check. Verbal order is given for INR check.  Travis Bond, PAC

## 2015-07-15 ENCOUNTER — Encounter: Payer: Self-pay | Admitting: Pulmonary Disease

## 2015-07-19 ENCOUNTER — Other Ambulatory Visit: Payer: Self-pay | Admitting: Internal Medicine

## 2015-07-27 ENCOUNTER — Other Ambulatory Visit: Payer: Self-pay | Admitting: Pulmonary Disease

## 2015-07-30 ENCOUNTER — Telehealth: Payer: Self-pay | Admitting: Pulmonary Disease

## 2015-07-30 MED ORDER — FENTANYL 25 MCG/HR TD PT72: 25.0000 ug | MEDICATED_PATCH | TRANSDERMAL | Status: AC

## 2015-07-30 NOTE — Telephone Encounter (Signed)
Called and spoke to [place] with Hospice. Toniann FailWendy wanted to give FYI to Dr. Craige CottaSood - pt was given Cipro on Sunday 11/27 for UTI and they are waiting sensitivity. Once they come back they will fax the results to our office for our records and to review.   Will forward to Dr. Craige CottaSood as Lorain ChildesFYI.

## 2015-07-30 NOTE — Telephone Encounter (Signed)
Okay to refill? 

## 2015-07-30 NOTE — Telephone Encounter (Signed)
Call[facility] and [facility] [place] [facility] and informed her of the ref[facility]. Rx signed by [facility] [address] Travis AbedLindsay verbalized understanding and denied any further questions or concerns at this time.

## 2015-07-30 NOTE — Telephone Encounter (Signed)
Noted  

## 2015-07-30 NOTE — Telephone Encounter (Signed)
Hospice calling regarding refill on Fentanyl patches. Last refilled: 05/09/15  Dr. Craige CottaSood, ok to refill?

## 2015-07-31 NOTE — Telephone Encounter (Signed)
LMTCB for wendy

## 2015-07-31 NOTE — Telephone Encounter (Signed)
Return call from wendy  And can be reached @ same #.Travis GriffinsStanley A Bond

## 2015-07-31 NOTE — Telephone Encounter (Signed)
Hospice calling back Travis Bond [phone]  They got the final urine and it shows Mersa they have discontinued the cipro and will not presc anything else

## 2015-07-31 NOTE — Telephone Encounter (Signed)
Called spoke with Toniann FailWendy. Pt cultures came back positive for MRSA. Cipro was stopped and hospice doc did not want to order anything else. FYI to Dr. Craige CottaSood,

## 2015-08-01 ENCOUNTER — Encounter: Payer: Self-pay | Admitting: Internal Medicine

## 2015-08-01 ENCOUNTER — Telehealth: Payer: Self-pay | Admitting: Pulmonary Disease

## 2015-08-01 ENCOUNTER — Non-Acute Institutional Stay (SKILLED_NURSING_FACILITY): Payer: Medicare Other | Admitting: Internal Medicine

## 2015-08-01 DIAGNOSIS — I482 Chronic atrial fibrillation, unspecified: Secondary | ICD-10-CM

## 2015-08-01 DIAGNOSIS — I119 Hypertensive heart disease without heart failure: Secondary | ICD-10-CM | POA: Diagnosis not present

## 2015-08-01 DIAGNOSIS — J432 Centrilobular emphysema: Secondary | ICD-10-CM | POA: Diagnosis not present

## 2015-08-01 DIAGNOSIS — J9611 Chronic respiratory failure with hypoxia: Secondary | ICD-10-CM

## 2015-08-01 DIAGNOSIS — I25119 Atherosclerotic heart disease of native coronary artery with unspecified angina pectoris: Secondary | ICD-10-CM

## 2015-08-01 DIAGNOSIS — E038 Other specified hypothyroidism: Secondary | ICD-10-CM

## 2015-08-01 DIAGNOSIS — E785 Hyperlipidemia, unspecified: Secondary | ICD-10-CM | POA: Diagnosis not present

## 2015-08-01 DIAGNOSIS — J8410 Pulmonary fibrosis, unspecified: Secondary | ICD-10-CM | POA: Diagnosis not present

## 2015-08-01 NOTE — Assessment & Plan Note (Signed)
Positive ANA but the bigger resp problem is the COPD; cont chronic O2

## 2015-08-01 NOTE — Telephone Encounter (Signed)
Called spoke with [place]say from hospice. She wanted to know if Toniann FailWendy call us yesterday regarding pt results. I advised she did and we made Dr. Craige CottaSood aware. She is going to speak with family today and if they have any questions she will call back. Nothing further needed for now.

## 2015-08-01 NOTE — Progress Notes (Signed)
MRN: [id number] Name: Travis Bond  Sex: male Age: 81 y.o. DOB: 1927-05-18  PSC #: Ronni Rumble Facility/Room:121 Level Of Care: SNF Provider: Merrilee Seashore D Emergency Contacts: Extended Emergency Contact Information Primary Emergency Contact: Hindson,Renee Address: [address]          [place], [place] [address] [place] Phone: [phone] Relation: Daughter Secondary Emergency Contact: Hindson,Jeffrey  [place] Mobile Phone: [phone] Relation: Grandson  Code Status:   Allergies: Tramadol  Chief Complaint  Patient presents with  . New Admit To SNF    HPI: Patient is 81 y.o. male with chronic respiratory failure with hypoxia, COPD on home oxygen, pulmonary fibrosis, on Hospice for same,CAD, HTN, HLD, PVD, hypothyroidism, atrial fibrillation who is being admitted to SNF for residential and Hospice care. While at SNF pt will be followed for HTN, tx with diltiazem, coreg, Imdur and lasix, A fib, tx with digoxin and coreg and CAD, tx with imdur and coreg.  Past Medical History  Diagnosis Date  . COPD (chronic obstructive pulmonary disease) (HCC)   . Chronic respiratory failure with hypoxia (HCC)   . Pulmonary fibrosis (HCC)   . Coronary artery disease   . Hypertension   . Hyperlipidemia   . Carotid stenosis   . Peripheral vascular disease (HCC)   . Anemia   . Peripheral neuropathy (HCC)   . B12 deficiency   . Pneumonia   . Atrial fibrillation (HCC)   . Hypothyroidism   . Anxiety state, unspecified   . Unspecified deficiency anemia   . Abdominal aneurysm without mention of rupture   . Aortic valve disorders   . Type II or unspecified type diabetes mellitus without mention of complication, not stated as uncontrolled   . Unspecified hereditary and idiopathic peripheral neuropathy   . Microscopic hematuria   . Chronic kidney disease     Kidney stones    Past Surgical History  Procedure Laterality Date  .  Inguinal hernia repair  1994  . Cardioversion  09/21/2011    Procedure: CARDIOVERSION;  Surgeon: Darden Palmer., MD;  Location: [facility] OR;  Service: Cardiovascular;  Laterality: N/A;  . Cardiac stent  2010 or 2011 pt not sure  . Hernia repair  1989      Medication List       This list is accurate as of: 08/01/15 11:59 PM.  Always use your most recent med list.               albuterol 108 (90 BASE) MCG/ACT inhaler  Commonly known as:  PROVENTIL HFA;VENTOLIN HFA  Inhale 2 puffs into the lungs every 6 (six) hours as needed for wheezing or shortness of breath.     albuterol (2.5 MG/3ML) 0.083% nebulizer solution  Commonly known as:  PROVENTIL  Take 3 mLs (2.5 mg total) by nebulization every 2 (two) hours as needed for wheezing or shortness of breath.     ALPRAZolam 0.25 MG tablet  Commonly known as:  XANAX  TAKE 1 TABLET BY MOUTH TWICE A DAY AS NEEDED AND TAKE 1 TABLET BY MOUTH AT BEDTIME     budesonide-formoterol 160-4.5 MCG/ACT inhaler  Commonly known as:  SYMBICORT  Inhale 2 puffs into the lungs 2 (two) times daily.     carvedilol 3.125 MG tablet  Commonly known as:  COREG  TAKE 1 TABLET BY MOUTH BY MOUTH TWICE A DAY     digoxin 0.125 MG tablet  Commonly known as:  LANOXIN  Take 0.125  mg by mouth daily.     diltiazem 30 MG tablet  Commonly known as:  CARDIZEM  APPOINTMENT OVERDUE, 1 by mouth every 6 hours     fentaNYL 25 MCG/HR patch  Commonly known as:  DURAGESIC - dosed mcg/hr  Place 1 patch (25 mcg total) onto the skin every 3 (three) days. For pain   HOSPICE PT     furosemide 20 MG tablet  Commonly known as:  LASIX  TAKE 1 TABLET BY MOUTH EVERY DAY     guaiFENesin 600 MG 12 hr tablet  Commonly known as:  MUCINEX  Take 600 mg by mouth 2 (two) times daily.     HYDROcodone-acetaminophen 5-325 MG tablet  Commonly known as:  NORCO/VICODIN  Take 1-2 tablets by mouth every 4 (four) hours as needed for moderate pain.     ipratropium 0.02 % nebulizer solution   Commonly known as:  ATROVENT  INHALE 1 VIAL VIA NEBULIZER 4 TIMES A DAY     isosorbide mononitrate 60 MG 24 hr tablet  Commonly known as:  IMDUR  TAKE 1 TABLET BY MOUTH EVERY DAY     levothyroxine 88 MCG tablet  Commonly known as:  SYNTHROID, LEVOTHROID  TAKE 1 TABLET BY MOUTH EVERY DAY     morphine 20 MG/ML concentrated solution  Commonly known as:  ROXANOL  Take 10 mg by mouth every 4 (four) hours as needed for severe pain.     nitroGLYCERIN 0.4 MG SL tablet  Commonly known as:  NITROSTAT  Place 1 tablet (0.4 mg total) under the tongue every 5 (five) minutes as needed for chest pain.     phenazopyridine 200 MG tablet  Commonly known as:  PYRIDIUM  Take 1 tablet (200 mg total) by mouth 3 (three) times daily.     polyethylene glycol packet  Commonly known as:  MIRALAX / GLYCOLAX  Take 17 g by mouth daily.     senna-docusate 8.6-50 MG tablet  Commonly known as:  Senokot-S  Take 4 tablets by mouth 2 (two) times daily.        Meds ordered this encounter  Medications  . polyethylene glycol (MIRALAX / GLYCOLAX) packet    Sig: Take 17 g by mouth daily.  Marland Kitchen senna-docusate (SENOKOT-S) 8.6-50 MG tablet    Sig: Take 4 tablets by mouth 2 (two) times daily.  Marland Kitchen morphine (ROXANOL) 20 MG/ML concentrated solution    Sig: Take 10 mg by mouth every 4 (four) hours as needed for severe pain.    Immunization History  Administered Date(s) Administered  . Influenza Split 06/01/2011, 05/10/2012  . Influenza,inj,Quad PF,36+ Mos 05/17/2013, 05/23/2014  . Pneumococcal Polysaccharide-23 09/01/2007    Social History  Substance Use Topics  . Smoking status: Former Smoker -- 1.00 packs/day for 65 years    Types: Cigarettes    Quit date: 01/18/2010  . Smokeless tobacco: Never Used  . Alcohol Use: No    Family history  + DM2, CHF  Review of Systems - UTO from pt 2/2 dementia    Filed Vitals:   08/03/15 1922  BP: 96/43  Pulse: 101  Temp: 96.3 F (35.7 C)  Resp: 20    SpO2  Readings from Last 1 Encounters:  08/03/15 97%        Physical Exam  GENERAL APPEARANCE: Alert, non conversant,  No acute distress.  SKIN: No diaphoresis rash HEAD: Normocephalic, atraumatic  EYES: Conjunctiva/lids clear. Pupils round, reactive. EOMs intact.  EARS: External exam WNL, canals clear. Hearing grossly  normal.  NOSE: No deformity or discharge.  MOUTH/THROAT: Lips w/o lesions  RESPIRATORY: Breathing is even, unlabored. Lung sounds are diminished   CARDIOVASCULAR: Heart RRR no murmurs, rubs or gallops. No peripheral edema.   GASTROINTESTINAL: Abdomen is soft, non-tender, not distended w/ normal bowel sounds. GENITOURINARY: Bladder non tender, not distended  MUSCULOSKELETAL: No abnormal joints or musculature NEUROLOGIC:  Cranial nerves 2-12 grossly intact. Moves all extremities  PSYCHIATRIC: , no behavioral issues  Patient Active Problem List   Diagnosis Date Noted  . Hospice care 07/07/2014  . Macrocytic anemia 06/09/2014  . Atrial fibrillation with RVR (HCC) 06/08/2014  . HCAP (healthcare-associated pneumonia) 06/08/2014  . Physical deconditioning 06/08/2014  . Hemoptysis 05/29/2014  . Protein-calorie malnutrition (HCC) 05/23/2014  . Loss of weight 04/25/2014  . Edema 02/20/2014  . Spondylosis, lumbar, with myelopathy 01/17/2014  . Encounter for therapeutic drug monitoring 01/17/2014  . Lumbosacral pain 01/03/2014  . Constipation 01/03/2014  . Occlusion and stenosis of carotid artery without mention of cerebral infarction 12/22/2013  . CKD (chronic kidney disease) stage 3, GFR 30-59 ml/min 12/19/2013  . Other dysphagia 10/10/2013  . Chronic lower back pain 08/15/2013  . Atherosclerosis of native arteries of the extremities with intermittent claudication 06/02/2013  . GERD (gastroesophageal reflux disease) 05/17/2013  . Chronic respiratory failure with hypoxia (HCC) 11/23/2012  . Anxiety 05/10/2012  . AAA (abdominal aortic aneurysm) without rupture (HCC)  11/25/2011  . Hypothyroidism 07/15/2011  . Peripheral vascular disease (HCC) 07/14/2011  . Aortic stenosis 07/14/2011  . Drug eluting stent  07/14/2011  . CAD (coronary artery disease)   . Atrial fibrillation (HCC)   . Hypertensive heart disease without CHF   . Peripheral neuropathy (HCC)   . Hyperlipidemia   . Mitral regurgitation   . Interstitial pulmonary fibrosis 12/05/2009  . COPD with emphysema (HCC) 12/04/2009    CBC    Component Value Date/Time   WBC 10.8* 11/02/2014 1813   WBC 6.7 02/20/2014 1623   RBC 3.81* 11/02/2014 1813   RBC 2.93* 05/23/2014 0045   RBC 3.46* 02/20/2014 1623   HGB 12.2* 11/02/2014 1813   HCT 38.3* 11/02/2014 1813   PLT 161 11/02/2014 1813   MCV 100.5* 11/02/2014 1813   LYMPHSABS 1.8 11/02/2014 1813   LYMPHSABS 2.1 05/17/2013 1619   MONOABS 0.8 11/02/2014 1813   EOSABS 0.1 11/02/2014 1813   EOSABS 0.2 05/17/2013 1619   BASOSABS 0.0 11/02/2014 1813   BASOSABS 0.0 05/17/2013 1619    CMP     Component Value Date/Time   NA 135 11/02/2014 1813   NA 137 03/20/2014 1416   K 3.7 11/02/2014 1813   CL 92* 11/02/2014 1813   CO2 33* 11/02/2014 1813   GLUCOSE 145* 11/02/2014 1813   GLUCOSE 155* 03/20/2014 1416   BUN 21 11/02/2014 1813   BUN 17 03/20/2014 1416   CREATININE 0.98 11/02/2014 1813   CALCIUM 8.2* 11/02/2014 1813   PROT 6.8 06/08/2014 1227   PROT 6.8 12/12/2013 1033   ALBUMIN 3.4* 06/08/2014 1227   ALBUMIN 4.0 12/12/2013 1033   AST 27 06/08/2014 1227   ALT 32 06/08/2014 1227   ALKPHOS 75 06/08/2014 1227   BILITOT 1.0 06/08/2014 1227   GFRNONAA 73* 11/02/2014 1813   GFRAA 84* 11/02/2014 1813    Lab Results  Component Value Date   HGBA1C 6.1* 03/20/2014     Dg Chest Port 1 View  11/02/2014  CLINICAL DATA:  Acute onset of cough and wheezing for 2 days. Initial encounter. EXAM: PORTABLE CHEST -  1 VIEW COMPARISON:  Chest radiograph performed 07/04/2014 FINDINGS: The lungs are well-aerated. Mild bibasilar opacities may reflect  atelectasis or possibly pneumonia. There is no evidence of pleural effusion or pneumothorax. The cardiomediastinal silhouette is borderline normal in size. No acute osseous abnormalities are seen. IMPRESSION: Mild bibasilar airspace opacities may reflect atelectasis or possibly pneumonia. Electronically Signed   By: Roanna RaiderJeffery  Chang M.D.   On: 11/02/2014 18:31    Not all labs, radiology exams or other studies done during hospitalization come through on my EPIC note; however they are reviewed by me.    Assessment and Plan  Chronic respiratory failure with hypoxia Related to COPD and resp fibrosis;chronic O2  COPD with emphysema Not stated as uncontrolled;Continue symbicort and atrovent daily with prn alb  Interstitial pulmonary fibrosis Positive ANA but the bigger resp problem is the COPD; cont chronic O2  CAD (coronary artery disease) SNF - chronic , controlled;plan - cont imdur and coreg  Atrial fibrillation SNF - chronic and stable; cont digoxin and coreg  Hypothyroidism SNF - not stated as uncontrolled; cont synthroid 88 mcg daily  Hypertensive heart disease without CHF SNF - controlled on diltiazem, coreg, lasix and imdur  Hyperlipidemia SNF - pt no longer on meds for lipids   Time spent > 45 min;> 50% of time with patient was spent reviewing records, labs, tests and studies, counseling and developing plan of care  Margit HanksALEXANDER, Benji Poynter D, MD

## 2015-08-01 NOTE — Assessment & Plan Note (Signed)
Related to COPD and resp fibrosis;chronic O2

## 2015-08-01 NOTE — Assessment & Plan Note (Signed)
Not stated as uncontrolled;Continue symbicort and atrovent daily with prn alb

## 2015-08-01 NOTE — Telephone Encounter (Signed)
Noted  

## 2015-08-03 ENCOUNTER — Encounter: Payer: Self-pay | Admitting: Internal Medicine

## 2015-08-03 NOTE — Assessment & Plan Note (Signed)
SNF - controlled on diltiazem, coreg, lasix and imdur

## 2015-08-03 NOTE — Assessment & Plan Note (Signed)
SNF - not stated as uncontrolled ; cont synthroid 88 mcg daily 

## 2015-08-03 NOTE — Assessment & Plan Note (Signed)
SNF - chronic , controlled;plan - cont imdur and coreg

## 2015-08-03 NOTE — Assessment & Plan Note (Signed)
SNF - chronic and stable; cont digoxin and coreg

## 2015-08-03 NOTE — Assessment & Plan Note (Signed)
SNF - pt no longer on meds for lipids

## 2015-08-13 ENCOUNTER — Telehealth: Payer: Self-pay | Admitting: Pulmonary Disease

## 2015-08-13 NOTE — Telephone Encounter (Signed)
[facility].  She says that her doctor is out of town and she needs refill on Morphine, wants to know if Dr. Craige CottaSood will refill patient's Morphine.  [facility] [address]  Dr. Craige CottaSood, please advise.

## 2015-08-13 NOTE — Telephone Encounter (Signed)
Please have script printed and I will sign when back in office on 08/15/15.

## 2015-08-14 MED ORDER — MORPHINE SULFATE (CONCENTRATE) 20 MG/ML PO SOLN: 10.0000 mg | ORAL | Status: DC | PRN

## 2015-08-14 NOTE — Telephone Encounter (Signed)
Lillia AbedLindsay with hospice has not heard back about this.  [phone]

## 2015-08-14 NOTE — Telephone Encounter (Signed)
Travis Bond with Hospice returning call, [phone].

## 2015-08-14 NOTE — Telephone Encounter (Signed)
Rx has been printed and placed in VS look at. Will route message to [name] to follow up on.

## 2015-08-14 NOTE — Telephone Encounter (Signed)
lmtcb for Travis Bond.  

## 2015-08-14 NOTE — Telephone Encounter (Signed)
Called and spoke with Lillia AbedLindsay from hospice and informed her of VS message stating that he would sign order on 08/15/15 when he returns to the office Lillia AbedLindsay stated that the pt was currently completely out of medication and she would email Dr Gibson RampFeldman to see if he would sign for med today Will send this message to VS and [name] stating that medication does not need to be signed for at this time.

## 2015-08-14 NOTE — Telephone Encounter (Signed)
Noted.  Will close encounter.  

## 2015-08-14 NOTE — Telephone Encounter (Signed)
lmtcb x1 for Travis Bond. 

## 2015-08-14 NOTE — Telephone Encounter (Signed)
Travis Bond returned call

## 2015-08-15 ENCOUNTER — Telehealth: Payer: Self-pay | Admitting: Pulmonary Disease

## 2015-08-15 MED ORDER — MORPHINE SULFATE (CONCENTRATE) 20 MG/ML PO SOLN: 10.0000 mg | ORAL | Status: AC | PRN

## 2015-08-15 NOTE — Telephone Encounter (Signed)
Rx has been signed by VS and faxed to pt's pharmacy. Lillia AbedLindsay is aware of this. Nothing further was needed.

## 2015-09-17 ENCOUNTER — Telehealth: Payer: Self-pay | Admitting: Pulmonary Disease

## 2015-09-17 MED ORDER — HYDROCODONE-ACETAMINOPHEN 5-325 MG PO TABS: 1.0000 | ORAL_TABLET | ORAL | Status: AC | PRN

## 2015-09-17 NOTE — Telephone Encounter (Signed)
Rx was printed, signed and [facility] [address]  [place] with Hospice and notified her that this was done

## 2015-09-17 NOTE — Telephone Encounter (Signed)
Okay to refill? 

## 2015-09-17 NOTE — Telephone Encounter (Signed)
Called spoke with Lillia Abed (hospice nurse). Pt needs refill on norco 5-325. Pt takes 1-2 q4hrs prn and 1 at bedtime.  This was last refilled by Dr. Craige Cotta 10/08/14 #360 x 0 refills for Take 1-2 tablets by mouth every 4 (four) hours as needed for moderate pain.  Please advise Dr. Craige Cotta thanks

## 2015-09-19 IMAGING — CR DG CHEST 1V PORT
1 series · 1 of 1 positions shown · non-contrast
Comparison: Chest radiograph performed 07/04/2014

CLINICAL DATA: Acute onset of cough and wheezing for 2 days.
Initial encounter.

EXAM:
PORTABLE CHEST - 1 VIEW

[AP]
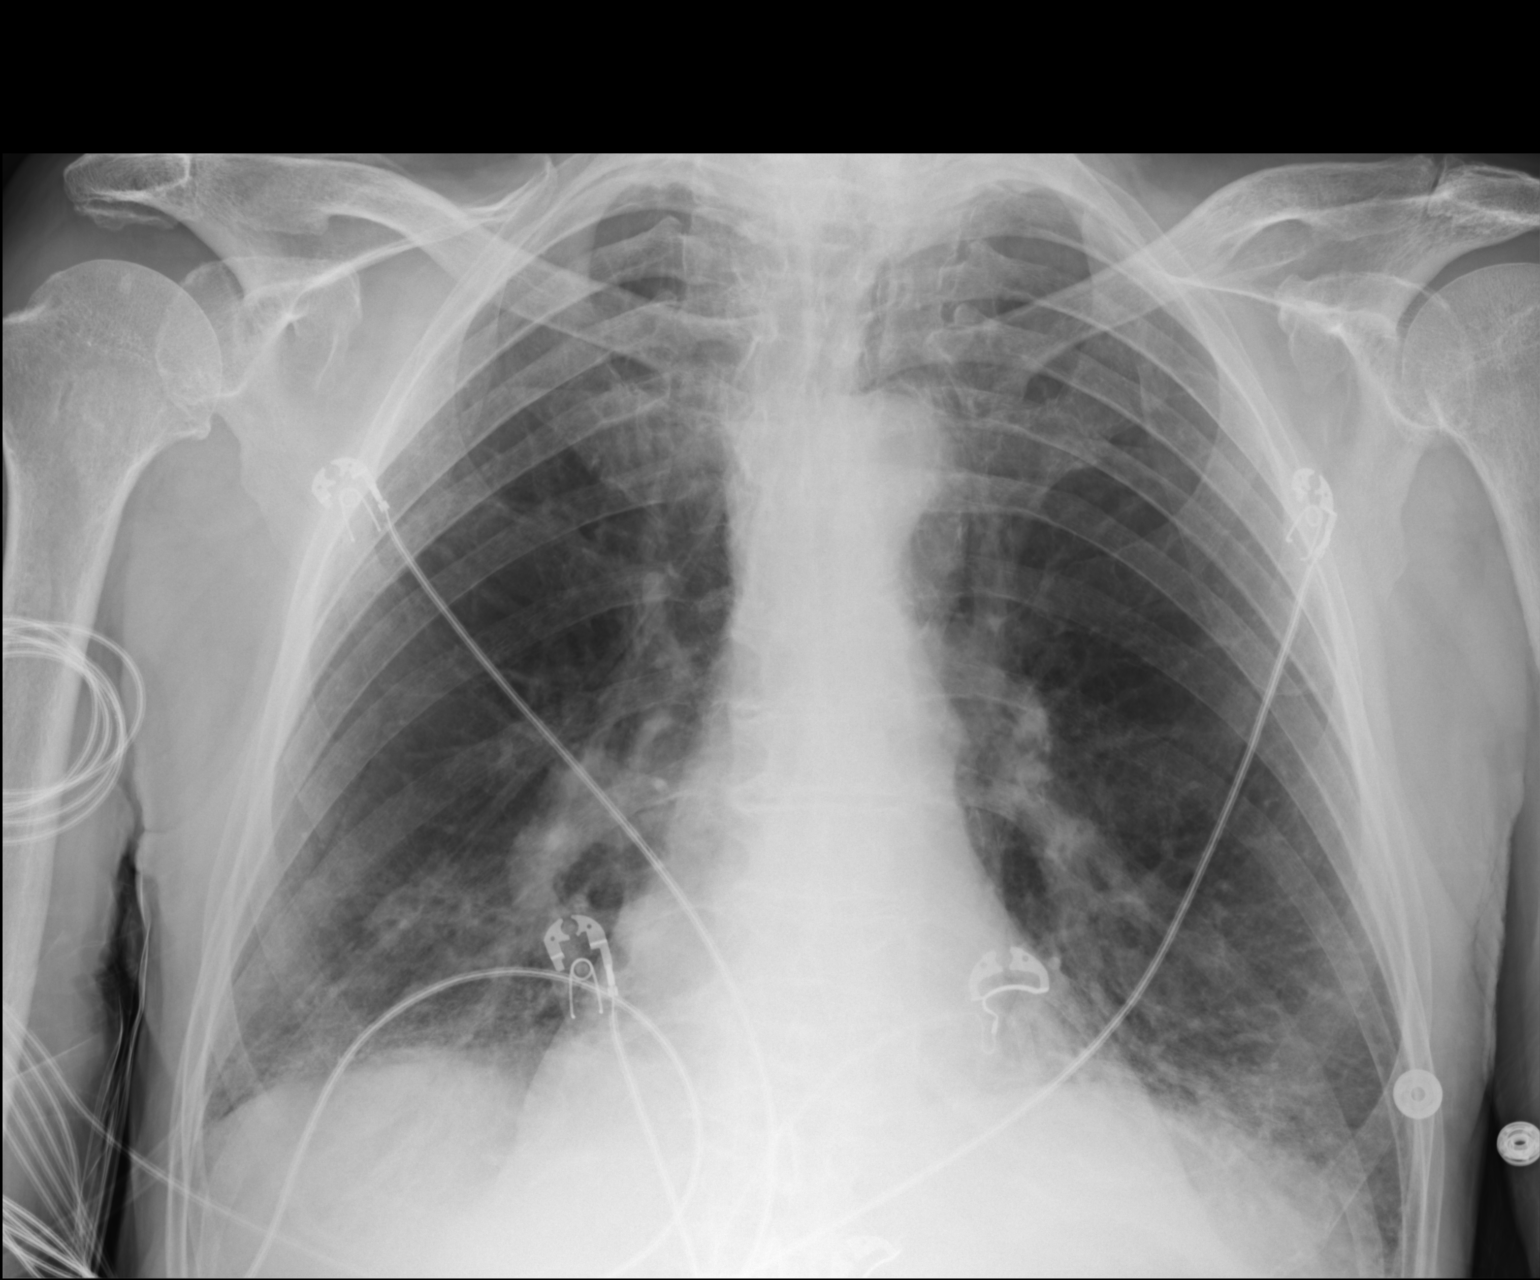

[1 of 1 positions shown; findings below may reference images not displayed]

FINDINGS: The lungs are well-aerated. Mild bibasilar opacities may reflect
atelectasis or possibly pneumonia. There is no evidence of pleural
effusion or pneumothorax.

The cardiomediastinal silhouette is borderline normal in size. No
acute osseous abnormalities are seen.
IMPRESSION: Mild bibasilar airspace opacities may reflect atelectasis or
possibly pneumonia.

## 2015-09-27 ENCOUNTER — Telehealth: Payer: Self-pay | Admitting: Pulmonary Disease

## 2015-09-27 NOTE — Telephone Encounter (Signed)
Called spoke with Lillia Abed (hospice nurse). She was calling just as an FYI pt is transitioning to actively dying. She called into the pharmacy for atropine drops for secretions. She did not need a call back. Will forward to Dr. Craige Cotta so he is aware.

## 2015-09-29 NOTE — Telephone Encounter (Signed)
Noted  

## 2015-10-02 DEATH — deceased

## 2015-10-04 ENCOUNTER — Telehealth: Payer: Self-pay

## 2015-10-04 NOTE — Telephone Encounter (Signed)
On 10/04/2015 I received a death certificate from [facility] (Original). The death certificate is for burial. The patient is a patient of Doctor Sood. The death certificate will be taken to [facility] Monday am for signature.  On 27-Oct-2015 I received the death certificate back from Doctor Gordon. I got the death certificate ready and called the funeral home to let them know I was mailing the death certificate to the [facility] per their request.

## 2015-11-20 ENCOUNTER — Telehealth: Payer: Self-pay

## 2015-11-20 NOTE — Telephone Encounter (Signed)
On 11/20/2015 I received a death certificate from [facility] (original). The death certificate is for burial. The patient is a patient of Doctor Sood. [facility] (11/25/2015[facility] because Doctor Craige CottaSood is on vacation this week and he signed the faxed copy so he would be the physician to sign the original  On 11/27/2015 I received the death certificate back from Doctor Tar HeelSood. I got the death certificate ready and called the funeral home to let them know the death certificate would be mailed to their funeral home today.
